# Patient Record
Sex: Female | Born: 1975 | Race: White | Hispanic: No | Marital: Married | Smoking: Former smoker
Health system: Southern US, Community
[De-identification: ages and names within clinical notes are randomized; demographics above are authoritative.]

## PROBLEM LIST (undated history)

## (undated) DIAGNOSIS — E538 Deficiency of other specified B group vitamins: Secondary | ICD-10-CM

## (undated) DIAGNOSIS — F329 Major depressive disorder, single episode, unspecified: Secondary | ICD-10-CM

## (undated) DIAGNOSIS — M545 Low back pain: Secondary | ICD-10-CM

## (undated) DIAGNOSIS — F32A Depression, unspecified: Secondary | ICD-10-CM

## (undated) DIAGNOSIS — F419 Anxiety disorder, unspecified: Secondary | ICD-10-CM

## (undated) DIAGNOSIS — I10 Essential (primary) hypertension: Secondary | ICD-10-CM

## (undated) DIAGNOSIS — T7840XA Allergy, unspecified, initial encounter: Secondary | ICD-10-CM

## (undated) HISTORY — DX: Anxiety disorder, unspecified: F41.9

## (undated) HISTORY — DX: Deficiency of other specified B group vitamins: E53.8

## (undated) HISTORY — DX: Essential (primary) hypertension: I10

## (undated) HISTORY — DX: Low back pain: M54.5

## (undated) HISTORY — DX: Major depressive disorder, single episode, unspecified: F32.9

## (undated) HISTORY — DX: Depression, unspecified: F32.A

## (undated) HISTORY — DX: Allergy, unspecified, initial encounter: T78.40XA

---

## 2000-07-18 ENCOUNTER — Other Ambulatory Visit: Admission: RE | Admit: 2000-07-18 | Discharge: 2000-07-18 | Payer: Self-pay | Admitting: Family Medicine

## 2001-01-19 ENCOUNTER — Other Ambulatory Visit: Admission: RE | Admit: 2001-01-19 | Discharge: 2001-01-19 | Payer: Self-pay | Admitting: Internal Medicine

## 2001-11-06 ENCOUNTER — Emergency Department (HOSPITAL_COMMUNITY): Admission: EM | Admit: 2001-11-06 | Discharge: 2001-11-06 | Payer: Self-pay

## 2001-11-25 ENCOUNTER — Encounter: Payer: Self-pay | Admitting: Family Medicine

## 2001-11-25 ENCOUNTER — Encounter: Admission: RE | Admit: 2001-11-25 | Discharge: 2001-11-25 | Payer: Self-pay | Admitting: Family Medicine

## 2002-05-01 ENCOUNTER — Emergency Department (HOSPITAL_COMMUNITY): Admission: EM | Admit: 2002-05-01 | Discharge: 2002-05-01 | Payer: Self-pay | Admitting: Emergency Medicine

## 2003-08-26 ENCOUNTER — Emergency Department (HOSPITAL_COMMUNITY): Admission: EM | Admit: 2003-08-26 | Discharge: 2003-08-26 | Payer: Self-pay | Admitting: Family Medicine

## 2003-09-09 ENCOUNTER — Emergency Department (HOSPITAL_COMMUNITY): Admission: EM | Admit: 2003-09-09 | Discharge: 2003-09-09 | Payer: Self-pay | Admitting: Family Medicine

## 2004-02-17 ENCOUNTER — Other Ambulatory Visit: Admission: RE | Admit: 2004-02-17 | Discharge: 2004-02-17 | Payer: Self-pay | Admitting: Obstetrics and Gynecology

## 2004-08-20 ENCOUNTER — Inpatient Hospital Stay (HOSPITAL_COMMUNITY): Admission: AD | Admit: 2004-08-20 | Discharge: 2004-08-20 | Payer: Self-pay | Admitting: Obstetrics & Gynecology

## 2004-08-24 ENCOUNTER — Inpatient Hospital Stay (HOSPITAL_COMMUNITY): Admission: AD | Admit: 2004-08-24 | Discharge: 2004-08-28 | Payer: Self-pay | Admitting: Obstetrics and Gynecology

## 2004-09-30 ENCOUNTER — Other Ambulatory Visit: Admission: RE | Admit: 2004-09-30 | Discharge: 2004-09-30 | Payer: Self-pay | Admitting: Obstetrics and Gynecology

## 2005-12-07 ENCOUNTER — Ambulatory Visit: Payer: Self-pay | Admitting: Internal Medicine

## 2006-07-14 ENCOUNTER — Ambulatory Visit: Payer: Self-pay | Admitting: Internal Medicine

## 2006-08-01 ENCOUNTER — Inpatient Hospital Stay (HOSPITAL_COMMUNITY): Admission: AD | Admit: 2006-08-01 | Discharge: 2006-08-01 | Payer: Self-pay | Admitting: Obstetrics and Gynecology

## 2006-08-05 ENCOUNTER — Encounter (INDEPENDENT_AMBULATORY_CARE_PROVIDER_SITE_OTHER): Payer: Self-pay | Admitting: Specialist

## 2006-08-05 ENCOUNTER — Inpatient Hospital Stay (HOSPITAL_COMMUNITY): Admission: AD | Admit: 2006-08-05 | Discharge: 2006-08-08 | Payer: Self-pay | Admitting: Obstetrics and Gynecology

## 2007-06-21 ENCOUNTER — Ambulatory Visit: Payer: Self-pay | Admitting: Internal Medicine

## 2007-06-21 DIAGNOSIS — N390 Urinary tract infection, site not specified: Secondary | ICD-10-CM | POA: Insufficient documentation

## 2007-06-21 LAB — CONVERTED CEMR LAB
Beta hcg, urine, semiquantitative: NEGATIVE
Bilirubin Urine: NEGATIVE
Glucose, Urine, Semiquant: NEGATIVE
Ketones, urine, test strip: NEGATIVE
Nitrite: NEGATIVE
Protein, U semiquant: NEGATIVE
RBC / HPF: NONE SEEN (ref <3)
Specific Gravity, Urine: >=1.03
Urobilinogen, UA: NEGATIVE
pH: 5

## 2008-04-30 ENCOUNTER — Telehealth (INDEPENDENT_AMBULATORY_CARE_PROVIDER_SITE_OTHER): Payer: Self-pay | Admitting: *Deleted

## 2009-01-19 ENCOUNTER — Ambulatory Visit: Payer: Self-pay | Admitting: Diagnostic Radiology

## 2009-01-19 ENCOUNTER — Emergency Department (HOSPITAL_BASED_OUTPATIENT_CLINIC_OR_DEPARTMENT_OTHER): Admission: EM | Admit: 2009-01-19 | Discharge: 2009-01-19 | Payer: Self-pay | Admitting: Emergency Medicine

## 2009-07-03 DIAGNOSIS — R42 Dizziness and giddiness: Secondary | ICD-10-CM | POA: Insufficient documentation

## 2009-07-26 DIAGNOSIS — M545 Low back pain, unspecified: Secondary | ICD-10-CM

## 2009-07-26 HISTORY — DX: Low back pain, unspecified: M54.50

## 2010-07-26 DIAGNOSIS — E538 Deficiency of other specified B group vitamins: Secondary | ICD-10-CM

## 2010-07-26 HISTORY — DX: Deficiency of other specified B group vitamins: E53.8

## 2010-09-01 DIAGNOSIS — M546 Pain in thoracic spine: Secondary | ICD-10-CM | POA: Insufficient documentation

## 2010-11-02 LAB — URINALYSIS, ROUTINE W REFLEX MICROSCOPIC
Nitrite: NEGATIVE
Protein, ur: NEGATIVE mg/dL
Urobilinogen, UA: 1 mg/dL (ref 0.0–1.0)

## 2010-11-02 LAB — POCT CARDIAC MARKERS
CKMB, poc: <1 ng/mL — ABNORMAL LOW (ref 1.0–8.0)
Myoglobin, poc: 25.6 ng/mL (ref 12–200)
Troponin i, poc: <0.05 ng/mL (ref 0.00–0.09)

## 2010-11-11 ENCOUNTER — Telehealth: Payer: Self-pay | Admitting: *Deleted

## 2010-11-11 NOTE — Telephone Encounter (Signed)
Patient requesting a call back. She will be a new patient in May and has questions.

## 2010-12-07 ENCOUNTER — Encounter: Payer: Self-pay | Admitting: Internal Medicine

## 2010-12-08 ENCOUNTER — Other Ambulatory Visit (INDEPENDENT_AMBULATORY_CARE_PROVIDER_SITE_OTHER): Payer: BC Managed Care – PPO

## 2010-12-08 ENCOUNTER — Encounter: Payer: Self-pay | Admitting: Internal Medicine

## 2010-12-08 ENCOUNTER — Telehealth: Payer: Self-pay | Admitting: Internal Medicine

## 2010-12-08 ENCOUNTER — Ambulatory Visit (INDEPENDENT_AMBULATORY_CARE_PROVIDER_SITE_OTHER): Payer: BC Managed Care – PPO | Admitting: Internal Medicine

## 2010-12-08 ENCOUNTER — Other Ambulatory Visit (INDEPENDENT_AMBULATORY_CARE_PROVIDER_SITE_OTHER): Payer: BC Managed Care – PPO | Admitting: Internal Medicine

## 2010-12-08 DIAGNOSIS — F32A Depression, unspecified: Secondary | ICD-10-CM

## 2010-12-08 DIAGNOSIS — R202 Paresthesia of skin: Secondary | ICD-10-CM

## 2010-12-08 DIAGNOSIS — M545 Low back pain, unspecified: Secondary | ICD-10-CM

## 2010-12-08 DIAGNOSIS — R209 Unspecified disturbances of skin sensation: Secondary | ICD-10-CM

## 2010-12-08 DIAGNOSIS — Z0000 Encounter for general adult medical examination without abnormal findings: Secondary | ICD-10-CM

## 2010-12-08 DIAGNOSIS — E559 Vitamin D deficiency, unspecified: Secondary | ICD-10-CM

## 2010-12-08 DIAGNOSIS — R5383 Other fatigue: Secondary | ICD-10-CM

## 2010-12-08 DIAGNOSIS — R5381 Other malaise: Secondary | ICD-10-CM

## 2010-12-08 DIAGNOSIS — E538 Deficiency of other specified B group vitamins: Secondary | ICD-10-CM

## 2010-12-08 DIAGNOSIS — R635 Abnormal weight gain: Secondary | ICD-10-CM

## 2010-12-08 DIAGNOSIS — F329 Major depressive disorder, single episode, unspecified: Secondary | ICD-10-CM

## 2010-12-08 LAB — COMPREHENSIVE METABOLIC PANEL
ALT: 16 U/L (ref 0–35)
AST: 16 U/L (ref 0–37)
Albumin: 4.3 g/dL (ref 3.5–5.2)
Alkaline Phosphatase: 40 U/L (ref 39–117)
Calcium: 9.4 mg/dL (ref 8.4–10.5)
Chloride: 103 mEq/L (ref 96–112)
Potassium: 4.2 mEq/L (ref 3.5–5.1)

## 2010-12-08 LAB — CBC WITH DIFFERENTIAL/PLATELET
Basophils Absolute: 0 10*3/uL (ref 0.0–0.1)
Basophils Relative: 0.2 % (ref 0.0–3.0)
Eosinophils Absolute: 0.1 10*3/uL (ref 0.0–0.7)
Hemoglobin: 13.8 g/dL (ref 12.0–15.0)
Lymphocytes Relative: 11.2 % — ABNORMAL LOW (ref 12.0–46.0)
MCHC: 34.3 g/dL (ref 30.0–36.0)
MCV: 94.4 fl (ref 78.0–100.0)
Monocytes Absolute: 0.5 10*3/uL (ref 0.1–1.0)
Neutro Abs: 8.8 10*3/uL — ABNORMAL HIGH (ref 1.4–7.7)
Neutrophils Relative %: 83 % — ABNORMAL HIGH (ref 43.0–77.0)
RDW: 13.3 % (ref 11.5–14.6)

## 2010-12-08 LAB — URINALYSIS, ROUTINE W REFLEX MICROSCOPIC
Nitrite: NEGATIVE
Total Protein, Urine: NEGATIVE
Urine Glucose: NEGATIVE
pH: 5.5 (ref 5.0–8.0)

## 2010-12-08 LAB — TSH: TSH: 2.3 u[IU]/mL (ref 0.35–5.50)

## 2010-12-08 MED ORDER — MELOXICAM 15 MG PO TABS: 15.0000 mg | ORAL_TABLET | Freq: Every day | ORAL | Status: DC | PRN

## 2010-12-08 MED ORDER — HYDROCODONE-ACETAMINOPHEN 7.5-325 MG PO TABS: 1.0000 | ORAL_TABLET | Freq: Three times a day (TID) | ORAL | Status: DC | PRN

## 2010-12-08 MED ORDER — VITAMIN B-12 1000 MCG SL SUBL: 1.0000 | SUBLINGUAL_TABLET | Freq: Every day | SUBLINGUAL | Status: DC

## 2010-12-08 MED ORDER — VITAMIN D 1000 UNITS PO TABS: 1000.0000 [IU] | ORAL_TABLET | Freq: Every day | ORAL | Status: DC

## 2010-12-08 NOTE — Progress Notes (Signed)
  Subjective:    Patient ID: [name], female    DOB: 04/24/1976, 35 y.o.   MRN: [id number]  HPI  New pt C/o LBP since Aug 2012. C/o bad pack pain 8/10, better w/rest. Predn did not work, Tramadol did not work, T#3 did not work. She had another LBP episode in 2003, that resolved.  She had an MRI at [facility] in Feb - saw Dr Rubie Maid in March She had an inj x 1 Dr Ezzard Standing S1-L1  Review of Systems  Constitutional: Positive for activity change and fatigue. Negative for unexpected weight change.  HENT: Negative for neck pain.   Eyes: Negative for pain.  Respiratory: Negative for chest tightness and shortness of breath.   Cardiovascular: Negative for chest pain and leg swelling.  Gastrointestinal: Negative for constipation and abdominal distention.  Genitourinary: Negative for difficulty urinating and pelvic pain.  Musculoskeletal: Positive for back pain, arthralgias and gait problem. Negative for myalgias and joint swelling.  Skin: Negative for rash and wound.  Neurological: Positive for numbness. Negative for tremors and weakness.  Hematological: Negative for adenopathy.  Psychiatric/Behavioral: Negative for suicidal ideas, behavioral problems and sleep disturbance. The patient is nervous/anxious.        Objective:   Physical Exam  Vitals reviewed. Constitutional: She is oriented to person, place, and time. She appears well-developed and well-nourished. No distress.       NAD Overweight some  HENT:  Head: Normocephalic.  Right Ear: External ear normal.  Left Ear: External ear normal.  Nose: Nose normal.  Mouth/Throat: Oropharynx is clear and moist.  Eyes: Conjunctivae are normal. Pupils are equal, round, and reactive to light. Right eye exhibits no discharge. Left eye exhibits no discharge.  Neck: Normal range of motion. Neck supple. No JVD present. No tracheal deviation present. No thyromegaly present.  Cardiovascular: Normal rate, regular rhythm and normal heart sounds.     Pulmonary/Chest: No stridor. No respiratory distress. She has no wheezes.  Abdominal: Soft. Bowel sounds are normal. She exhibits no distension and no mass. There is no tenderness. There is no rebound and no guarding.  Musculoskeletal: She exhibits tenderness. She exhibits no edema.       LS is very stiff - pain w/ROM  Lymphadenopathy:    She has no cervical adenopathy.  Neurological: She is oriented to person, place, and time. She displays normal reflexes. No cranial nerve deficit. She exhibits normal muscle tone. Coordination normal.  Skin: No rash noted. No erythema.  Psychiatric: Her behavior is normal. Judgment and thought content normal.       A little tearfull w/frustration       Nonfocal neuro exam   Assessment & Plan:  Vitamin D deficiency Start Rx  Vitamin B 12 deficiency Start Rx  Paresthesia Get labs  Fatigue Get labs  Depression On Rx  LBP (low back pain) I reviewed MRI, Hx - discussed. She needs ROM/stretching. She needs B12 and Vit D def treated.

## 2010-12-08 NOTE — Patient Instructions (Signed)
Stretch Start a yoga class We can start PT

## 2010-12-08 NOTE — Telephone Encounter (Signed)
[name], please, inform patient that all labs are normal except for low Vit B12 OV to start on Vit B12 shots and start SL B12

## 2010-12-09 ENCOUNTER — Telehealth: Payer: Self-pay | Admitting: *Deleted

## 2010-12-09 NOTE — Telephone Encounter (Signed)
Left mess for patient to call back.  

## 2010-12-09 NOTE — Telephone Encounter (Signed)
Left mess to call office back.   

## 2010-12-09 NOTE — Telephone Encounter (Signed)
Patient requesting a call back - has questions about a med that MD e-scribed yesterday.

## 2010-12-10 ENCOUNTER — Telehealth: Payer: Self-pay | Admitting: Internal Medicine

## 2010-12-10 MED ORDER — VITAMIN D3 1.25 MG (50000 UT) PO CAPS: 1.0000 | ORAL_CAPSULE | ORAL | Status: DC

## 2010-12-10 NOTE — Telephone Encounter (Signed)
[name], please, inform patient that her Vit D is low - Start Rx Vit D x 6 wks then start OTC daily Vit D Thx

## 2010-12-10 NOTE — Telephone Encounter (Signed)
Discussed, see other phone notes

## 2010-12-10 NOTE — Telephone Encounter (Signed)
Patient informed. 

## 2010-12-11 ENCOUNTER — Encounter: Payer: Self-pay | Admitting: Internal Medicine

## 2010-12-11 NOTE — Op Note (Signed)
[name], [name]              ACCOUNT NO.:  [id number]   MEDICAL RECORD NO.:  [id number]          PATIENT TYPE:  INP   LOCATION:  9147                          FACILITY:  [facility]   PHYSICIAN:  Zelphia Cairo, MD    DATE OF BIRTH:  1976/06/15   DATE OF PROCEDURE:  08/05/2006  DATE OF DISCHARGE:                               OPERATIVE REPORT   PREOPERATIVE DIAGNOSES:  1. Intrauterine pregnancy at 38 plus weeks.  2. Nonreassuring fetal well being.   POSTOPERATIVE DIAGNOSES:  1. Intrauterine pregnancy at 38 plus weeks.  2. Nonreassuring fetal well being.   PROCEDURE:  Primary low transverse cesarean delivery.   SURGEON:  Zelphia Cairo, MD   ESTIMATED BLOOD LOSS:  800 cc.   URINE OUTPUT:  200 cc of clear urine.   FINDINGS:  Vigorous female infant with Apgars of 9 and 9.  pH 7.32.  Normal pelvic anatomy.   ANESTHESIA:  General.   SPECIMENS:  Placenta to pathology.   CONDITION:  Stable to recovery room.   PROCEDURE:  The patient was taken to the operating room, where epidural  anesthesia was found to be adequate.  She was prepped and draped in  sterile fashion.  A skin incision was made with the scalpel and carried  down to the underlying fascia.  The fascia was incised in the midline  and extended laterally using Mayo scissors.  Kocher clamps were used to  tent the fascia upward.  The underlying rectus muscles were dissected  off using Mayo scissors.   Attention was then turned to the superior portion of the fascia, which  was tented up with Kocher clamps and the underlying rectus muscles  dissected off using the Mayo scissors.  The peritoneum was then  identified and entered sharply using Metzenbaum scissors;  this was  extended bluntly.  A bladder blade was then inserted and bladder flap  created with Metzenbaum scissors.  The bladder blade reinserted to  protect the bladder flap.   The scalpel was then used to make the uterine incision.  This was  extended  bluntly.  The fetal vertex was brought up to the incision and  the fetal head was delivered.  The mouth and nose were suctioned and the  body easily followed.  The cord was clamped and cut and the infant was  taken to the awaiting pediatric staff.  The cord blood was then drawn  and the placenta was manually removed from the uterus.  The uterus was  then closed using 1-0 chromic in a running, locked fashion.  Hemostasis  was assured and the pelvis was irrigated with warm normal saline.  The  peritoneum was then closed with Vicryl.  The fascia was closed with a  looped 0 PDS, and the skin was reapproximated with staples.  The patient  tolerated the procedure well.  Sponge, lap, needle and instrument counts  were correct x2.  She was taken to the recovery room in stable  condition.      Zelphia Cairo, MD  Electronically Signed     GA/MEDQ  D:  08/05/2006  T:  08/05/2006  Job:  [id number]

## 2010-12-11 NOTE — H&P (Signed)
NAMEBRITTANYANN, [name]              ACCOUNT NO.:  [id number]   MEDICAL RECORD NO.:  [id number]          PATIENT TYPE:  INP   LOCATION:  9173                          FACILITY:  [facility]   PHYSICIAN:  Michelle L. Grewal, M.D.DATE OF BIRTH:  1975/11/27   DATE OF ADMISSION:  08/24/2004  DATE OF DISCHARGE:                                HISTORY & PHYSICAL   HISTORY OF PRESENT ILLNESS:  This patient is a 35 year old, gravida 1, para  0, estimated gestational age at 78 weeks and 6 days who presents today from  the office.  She presented to the office today complaining of nausea and  vomiting for two days.  She denies any fever.  She reports that her husband  has been sick with viral illness which included nausea, vomiting, and  diarrhea.  In the office, her urine revealed +3 ketones and she was noted to  be contracting on the monitor every two to four minutes.   Of note, prenatal care see [place] form.  She is group B strep positive.   PHYSICAL EXAMINATION:  GENERAL:  She is afebrile with stable vital signs.  Fetal heart rate initially was nonreactive in Triage but now reassuring.  No  decelerations noted.  PELVIC:  Uterine contractions every two to four minutes, Leopold's 7-1/2  pounds.  Cervix is 30% effaced.  Cervix is closed and vertex presentation.  CARDIOVASCULAR:  Within normal limits.  LUNGS:  Within normal limits.  ABDOMEN:  Benign, nontender, gravid abdomen.   LABORATORY DATA:  Ultrasound in Triage revealed an amniotic fluid index of  6.9 and biophysical profile of 8 out of 8.   IMPRESSION:  1.  Intrauterine pregnancy at 37 weeks and 6 days.  2.  Decreased fetal movement.  3.  Borderline low amniotic fluid index.  4.  Recent viral illness.   PLAN:  The patient responded wonderfully to IV fluids and antiemetics.  However, because of decreased movement and borderline low AFI, recommend  admission to labor and delivery for induction.  We will initiate Cytotec  tonight and plan  Pitocin and amniotomy in the morning.  We will start  antibiotics for group B strep prophylaxis.      MLG/MEDQ  D:  08/24/2004  T:  08/24/2004  Job:  [id number]

## 2010-12-11 NOTE — Discharge Summary (Signed)
[name], [name]              ACCOUNT NO.:  [id number]   MEDICAL RECORD NO.:  [id number]          PATIENT TYPE:  INP   LOCATION:  9128                          FACILITY:  [facility]   PHYSICIAN:  Guy Sandifer. Henderson Cloud, M.D. DATE OF BIRTH:  12-13-1975   DATE OF ADMISSION:  08/05/2006  DATE OF DISCHARGE:  08/08/2006                               DISCHARGE SUMMARY   ADMITTING DIAGNOSES:  1. Intrauterine pregnancy at 75 and three-sevenths weeks estimated      gestational age.  2. Induction of labor secondary to oligohydramnios.   DISCHARGE DIAGNOSES:  1. Status post low transverse cesarean section secondary to      nonreassuring fetal heart tones.  2. Viable female infant.   PROCEDURE:  Primary low transverse cesarean section.   REASON FOR ADMISSION:  Please see written H&P.   HOSPITAL COURSE:  The patient is a 35 year old gravida 3, para 1 that  was admitted to [facility] at 38 and three-sevenths  weeks for an induction of labor secondary to oligohydramnios.  The  patient denied loss of amniotic fluid or vaginal bleeding.  On admission  she was noted to have some irregular contractions.  Fetal movement was  noted.  Cervix was dilated to 2 cm, 75% effaced, vertex at a -2 station.  Artificial rupture of membranes was performed which revealed clear  fluid.  Pitocin was started later in the afternoon to augment her labor.  Fetal heart tones were in the 130s to 140s with reassuring  accelerations.  Contractions were approximately every 2-4 minutes.  Later in the afternoon the fetal heart tones were noted to have some  change in the baseline.  Maternal blood pressure was noted to be low and  there were noted to be some moderate variable decelerations.  Cervix was  now dilated to 7 cm, 90% effaced, vertex now at a 0 station.  Scalp  stimulation was performed, intrauterine pressure catheter was placed.  Variability was marked with some questionable late deceleration.  Maternal  blood pressure was 90/30.  Ephedrine was given and positional  change was performed and oxygen administration with amnioinfusion.  Fetal heart tones were now in the 130-140 days.  Blood pressure improved  to 98/48.  Shortly thereafter, fetal heart tones again were noted to be  in the 90s to 110s.  There was some good beat-to-beat variability, but  concern was regarding unpredictable fetal heart tone pattern.  Decision  was made to proceed with a primary low transverse cesarean section due  to nonreassuring fetal heart tones.  Cervix was dilated to 8 cm,  completely effaced, at a 0 station.  The patient was then transferred to  the operating room where spinal anesthesia was administered without  difficulty.  A low transverse incision was made with the delivery of a  viable female infant weighing 6 pounds 4 ounces with Apgars of 9 at one  minute and 9 at five minutes.  Arterial cord pH was 7.32.  The patient  tolerated the procedure well and was taken to the recovery room in  stable condition.  On postoperative day #1  the patient was without  complaint.  Vital signs were stable.  She was afebrile.  Abdomen was  soft, fundus firm and nontender.  Incision was noted to be clean, dry  and intact.  Laboratory findings revealed hemoglobin of 10.1; platelet  count 174,000; wbc count of 14.9.  On postoperative day #2 the patient  was without complaint.  Vital signs were stable.  Abdomen was soft.  Incision was clean, dry and intact.  The patient had requested early  discharge; however, the baby was not feeding well and decision was made  to stay an additional evening.  On the following morning the patient  without complaint.  Baby was now feeding well.  Vital signs were stable.  Abdomen soft, fundus firm and nontender.  Staples were removed and the  patient was later discharged home.   CONDITION ON DISCHARGE:  Good.   DIET:  Regular as tolerated.   ACTIVITY:  No heavy lifting, no driving x2  weeks, no vaginal entry.   FOLLOWUP:  The patient is to follow up in the office in 1-2 weeks for an  incision check.  She is to call for temperature greater than 100  degrees, persistent nausea/vomiting, heavy vaginal bleeding, and/or  redness or drainage from the incisional site.   DISCHARGE MEDICATIONS:  1. Percocet 5/325 #30 one p.o. every 4-6 hours p.r.n.  2. Motrin 600 mg every 6 hours.  3. Prenatal vitamins one p.o. daily.  4. Colace one p.o. daily p.r.n.      Julio Sicks, N.P.      Guy Sandifer. Henderson Cloud, M.D.  Electronically Signed    CC/MEDQ  D:  08/22/2006  T:  08/22/2006  Job:  [id number]

## 2010-12-12 ENCOUNTER — Encounter: Payer: Self-pay | Admitting: Internal Medicine

## 2010-12-12 DIAGNOSIS — R5383 Other fatigue: Secondary | ICD-10-CM | POA: Insufficient documentation

## 2010-12-12 DIAGNOSIS — R202 Paresthesia of skin: Secondary | ICD-10-CM | POA: Insufficient documentation

## 2010-12-12 DIAGNOSIS — M545 Low back pain, unspecified: Secondary | ICD-10-CM | POA: Insufficient documentation

## 2010-12-12 DIAGNOSIS — E559 Vitamin D deficiency, unspecified: Secondary | ICD-10-CM | POA: Insufficient documentation

## 2010-12-12 DIAGNOSIS — F32A Depression, unspecified: Secondary | ICD-10-CM | POA: Insufficient documentation

## 2010-12-12 DIAGNOSIS — F329 Major depressive disorder, single episode, unspecified: Secondary | ICD-10-CM | POA: Insufficient documentation

## 2010-12-12 DIAGNOSIS — E538 Deficiency of other specified B group vitamins: Secondary | ICD-10-CM | POA: Insufficient documentation

## 2010-12-12 NOTE — Assessment & Plan Note (Signed)
Start Rx 

## 2010-12-12 NOTE — Assessment & Plan Note (Signed)
On Rx 

## 2010-12-12 NOTE — Assessment & Plan Note (Signed)
Get labs

## 2010-12-12 NOTE — Assessment & Plan Note (Signed)
I reviewed MRI, Hx - discussed. She needs ROM/stretching. She needs B12 and Vit D def treated.

## 2010-12-15 ENCOUNTER — Ambulatory Visit (INDEPENDENT_AMBULATORY_CARE_PROVIDER_SITE_OTHER): Payer: BC Managed Care – PPO | Admitting: Internal Medicine

## 2010-12-15 ENCOUNTER — Encounter: Payer: Self-pay | Admitting: Internal Medicine

## 2010-12-15 DIAGNOSIS — E538 Deficiency of other specified B group vitamins: Secondary | ICD-10-CM

## 2010-12-15 DIAGNOSIS — F32A Depression, unspecified: Secondary | ICD-10-CM

## 2010-12-15 DIAGNOSIS — E559 Vitamin D deficiency, unspecified: Secondary | ICD-10-CM

## 2010-12-15 DIAGNOSIS — M545 Low back pain, unspecified: Secondary | ICD-10-CM

## 2010-12-15 DIAGNOSIS — F329 Major depressive disorder, single episode, unspecified: Secondary | ICD-10-CM

## 2010-12-15 MED ORDER — CYANOCOBALAMIN 1000 MCG/ML IJ SOLN
1000.0000 ug | Freq: Once | INTRAMUSCULAR | Status: AC
  Administered 2010-12-15: 1000 ug via INTRAMUSCULAR

## 2010-12-15 NOTE — Progress Notes (Signed)
  Subjective:    Patient ID: [name], female    DOB: 12-06-1975, 35 y.o.   MRN: [id number]  HPI  F/u LBP, B12 def and Vit D def  Review of Systems  Constitutional: Negative for chills and appetite change.  Musculoskeletal: Positive for back pain. Negative for myalgias and joint swelling.  Skin: Negative for pallor.  Neurological: Negative for syncope.  Psychiatric/Behavioral: The patient is nervous/anxious.        Objective:   Physical Exam  Constitutional: She appears well-developed and well-nourished. No distress.  HENT:  Head: Normocephalic.  Right Ear: External ear normal.  Left Ear: External ear normal.  Nose: Nose normal.  Mouth/Throat: Oropharynx is clear and moist.  Eyes: Conjunctivae are normal. Pupils are equal, round, and reactive to light. Right eye exhibits no discharge. Left eye exhibits no discharge.  Neck: Normal range of motion. Neck supple. No JVD present. No tracheal deviation present. No thyromegaly present.  Cardiovascular: Normal rate, regular rhythm and normal heart sounds.   Pulmonary/Chest: No stridor. No respiratory distress. She has no wheezes.  Abdominal: Soft. Bowel sounds are normal. She exhibits no distension and no mass. There is no tenderness. There is no rebound and no guarding.  Musculoskeletal: She exhibits tenderness (tender LS). She exhibits no edema.  Lymphadenopathy:    She has no cervical adenopathy.  Neurological: She displays normal reflexes. No cranial nerve deficit. She exhibits normal muscle tone. Coordination normal.  Skin: No rash noted. No erythema.  Psychiatric: She has a normal mood and affect. Her behavior is normal. Judgment and thought content normal.       Lab Results  Component Value Date   WBC 10.6* 12/08/2010   HGB 13.8 12/08/2010   HCT 40.2 12/08/2010   PLT 249.0 12/08/2010   ALT 16 12/08/2010   AST 16 12/08/2010   NA 138 12/08/2010   K 4.2 12/08/2010   CL 103 12/08/2010   CREATININE 0.8 12/08/2010   BUN 16  12/08/2010   CO2 27 12/08/2010   TSH 2.30 12/08/2010     Assessment & Plan:   Vitamin B 12 deficiency Start Rx  Vitamin D deficiency Start Rx  LBP (low back pain) Start yoga  Depression On Rx

## 2010-12-15 NOTE — Patient Instructions (Signed)
[email or web address] 

## 2010-12-17 ENCOUNTER — Other Ambulatory Visit: Payer: Self-pay | Admitting: Specialist

## 2010-12-18 ENCOUNTER — Encounter: Payer: Self-pay | Admitting: Internal Medicine

## 2010-12-21 ENCOUNTER — Encounter: Payer: Self-pay | Admitting: Internal Medicine

## 2010-12-21 NOTE — Assessment & Plan Note (Signed)
Start Rx 

## 2010-12-21 NOTE — Assessment & Plan Note (Signed)
On Rx 

## 2010-12-21 NOTE — Assessment & Plan Note (Signed)
Start yoga  

## 2011-01-14 ENCOUNTER — Telehealth: Payer: Self-pay | Admitting: *Deleted

## 2011-01-14 NOTE — Telephone Encounter (Signed)
Pt c/o sore throat, "hurts to swallow", ear pain and cough. I advised OV for eval and offered apt tomorrow. She is going out of town - I suggested she go to [facility] for eval b/c she is worried she may have strep throat. - Pt agreed.

## 2011-01-20 ENCOUNTER — Telehealth: Payer: Self-pay | Admitting: *Deleted

## 2011-01-20 NOTE — Telephone Encounter (Signed)

## 2011-01-20 NOTE — Telephone Encounter (Signed)
Patient requesting advisement. She is c/o cough x 1 week - no pain, fever or sinus drainage.

## 2011-01-21 ENCOUNTER — Ambulatory Visit (INDEPENDENT_AMBULATORY_CARE_PROVIDER_SITE_OTHER): Payer: BC Managed Care – PPO | Admitting: Internal Medicine

## 2011-01-21 ENCOUNTER — Encounter: Payer: Self-pay | Admitting: Internal Medicine

## 2011-01-21 DIAGNOSIS — R062 Wheezing: Secondary | ICD-10-CM | POA: Insufficient documentation

## 2011-01-21 DIAGNOSIS — M545 Low back pain, unspecified: Secondary | ICD-10-CM

## 2011-01-21 DIAGNOSIS — Z0000 Encounter for general adult medical examination without abnormal findings: Secondary | ICD-10-CM | POA: Insufficient documentation

## 2011-01-21 DIAGNOSIS — J209 Acute bronchitis, unspecified: Secondary | ICD-10-CM | POA: Insufficient documentation

## 2011-01-21 MED ORDER — HYDROCODONE-HOMATROPINE 5-1.5 MG/5ML PO SYRP: 5.0000 mL | ORAL_SOLUTION | Freq: Four times a day (QID) | ORAL | Status: DC | PRN

## 2011-01-21 MED ORDER — HYDROCODONE-ACETAMINOPHEN 7.5-500 MG PO TABS: ORAL_TABLET | ORAL | Status: DC

## 2011-01-21 MED ORDER — AZITHROMYCIN 250 MG PO TABS: ORAL_TABLET | ORAL | Status: AC

## 2011-01-21 MED ORDER — PREDNISONE 10 MG PO TABS: 20.0000 mg | ORAL_TABLET | Freq: Every day | ORAL | Status: AC

## 2011-01-21 NOTE — Assessment & Plan Note (Signed)
stable overall by hx and exam, most recent data reviewed with pt, and pt to continue medical treatment as before; vicodin refilled one mo only;  F/u Dr Posey Rea next visit planned July 24

## 2011-01-21 NOTE — Progress Notes (Signed)
  Subjective:    Patient ID: [name], female    DOB: [birth year], 35 y.o.   MRN: [id number]  HPI Here with acute onset mild to mod 2-3 days ST, HA, general weakness and malaise, with prod cough greenish sputum, but Pt denies chest pain, orthopnea, PND, increased LE swelling, palpitations, dizziness or syncope, but has had mild sob and wheezing onset this AM.  Pt denies new neurological symptoms such as new headache, or facial or extremity weakness or numbness   Pt denies polydipsia, polyuria.  Pt continues to have recurring LBP without change in severity, bowel or bladder change, fever, wt loss,  worsening LE pain/numbness/weakness, gait change or falls. 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 Past Surgical History  Procedure Date  . Cesarean section 1-08    reports that she has quit smoking. She does not have any smokeless tobacco history on file. Her alcohol and drug histories not on file. family history includes Kidney disease in her other. No Known Allergies Current Outpatient Prescriptions on File Prior to Visit  Medication Sig Dispense Refill  . ALPRAZolam (XANAX) 0.25 MG tablet Take 0.25 mg by mouth as needed.        . cholecalciferol (VITAMIN D) 1000 UNITS tablet Take 1 tablet (1,000 Units total) by mouth daily.  100 tablet  3  . Cholecalciferol (VITAMIN D3) 50000 UNITS CAPS Take 1 capsule by mouth once a week.  6 capsule  0  . Cyanocobalamin (VITAMIN B-12) 1000 MCG SUBL Place 1 tablet (1,000 mcg total) under the tongue daily.  100 tablet  3  . escitalopram (LEXAPRO) 10 MG tablet Take 10 mg by mouth daily.        . meloxicam (MOBIC) 15 MG tablet Take 1 tablet (15 mg total) by mouth daily as needed for pain.  1 tablet  3   Review of Systems Review of Systems  Constitutional: Negative for diaphoresis and unexpected weight change.  HENT: Negative for drooling and tinnitus.   Eyes: Negative for photophobia  and visual disturbance.  Respiratory: Negative for choking and stridor.   Gastrointestinal: Negative for vomiting and blood in stool.  Genitourinary: Negative for hematuria and decreased urine volume.      Objective:   Physical Exam BP 120/80  Pulse 83  Temp(Src) 98.2 F (36.8 C) (Oral)  Ht 5\' 6"  (1.676 m)  Wt 176 lb 2 oz (79.89 kg)  BMI 28.43 kg/m2  SpO2 98% Physical Exam  VS noted, mild ill Constitutional: Pt appears well-developed and well-nourished.  HENT: Head: Normocephalic.  Right Ear: External ear normal.  Left Ear: External ear normal.  Bilat tm's mild erythema.  Sinus nontender.  Pharynx mild erythema Eyes: Conjunctivae and EOM are normal. Pupils are equal, round, and reactive to light.  Neck: Normal range of motion. Neck supple.  Cardiovascular: Normal rate and regular rhythm.   Pulmonary/Chest: Effort normal and breath sounds normal.  Abd:  Soft, NT, non-distended, + BS Neurological: Pt is alert. No cranial nerve deficit. Spine nontender Motor/sens/dtr intact, gait intact Skin: Skin is warm. No erythema.  Psychiatric: Pt behavior is normal. Thought content normal.         Assessment & Plan:

## 2011-01-21 NOTE — Patient Instructions (Signed)
Take all new medications as prescribed Continue all other medications as before  

## 2011-01-21 NOTE — Assessment & Plan Note (Signed)
Mild, for predpak for home,  to f/u any worsening symptoms or concerns

## 2011-01-21 NOTE — Telephone Encounter (Signed)
Left pt detailed vm on personal VM

## 2011-01-21 NOTE — Assessment & Plan Note (Signed)
Mild to mod, for antibx course,  to f/u any worsening symptoms or concerns 

## 2011-02-10 ENCOUNTER — Other Ambulatory Visit (INDEPENDENT_AMBULATORY_CARE_PROVIDER_SITE_OTHER): Payer: BC Managed Care – PPO

## 2011-02-10 DIAGNOSIS — E559 Vitamin D deficiency, unspecified: Secondary | ICD-10-CM

## 2011-02-10 DIAGNOSIS — E538 Deficiency of other specified B group vitamins: Secondary | ICD-10-CM

## 2011-02-10 LAB — URINALYSIS
Bilirubin Urine: NEGATIVE
Ketones, ur: NEGATIVE
Total Protein, Urine: NEGATIVE
Urine Glucose: NEGATIVE
Urobilinogen, UA: 0.2 (ref 0.0–1.0)

## 2011-02-10 LAB — VITAMIN B12: Vitamin B-12: 709 pg/mL (ref 211–911)

## 2011-02-11 ENCOUNTER — Telehealth: Payer: Self-pay | Admitting: Internal Medicine

## 2011-02-11 NOTE — Telephone Encounter (Signed)
Keep ROV 

## 2011-02-16 ENCOUNTER — Encounter: Payer: Self-pay | Admitting: Internal Medicine

## 2011-02-16 ENCOUNTER — Ambulatory Visit (INDEPENDENT_AMBULATORY_CARE_PROVIDER_SITE_OTHER): Payer: BC Managed Care – PPO | Admitting: Internal Medicine

## 2011-02-16 VITALS — BP 122/90 | HR 84 | Temp 98.6°F | Resp 16 | Wt 174.0 lb

## 2011-02-16 DIAGNOSIS — M545 Low back pain, unspecified: Secondary | ICD-10-CM

## 2011-02-16 MED ORDER — HYDROCODONE-ACETAMINOPHEN 7.5-500 MG PO TABS: ORAL_TABLET | ORAL | Status: DC

## 2011-02-16 MED ORDER — HYDROCODONE-ACETAMINOPHEN 7.5-325 MG PO TABS: 1.0000 | ORAL_TABLET | Freq: Three times a day (TID) | ORAL | Status: DC | PRN

## 2011-02-16 NOTE — Assessment & Plan Note (Signed)
Better some Has to take Hydr/APAP prn

## 2011-02-16 NOTE — Progress Notes (Signed)
  Subjective:    Patient ID: [name], female    DOB: July 24, 1976, 35 y.o.   MRN: [id number]  HPI  The patient presents for a follow-up of  chronic LBP, Vit D def and Vit B12 controlled with medicines Pain is 5/10 now C/o nausea - stomach bug today    Review of Systems  Constitutional: Negative for chills, activity change, appetite change, fatigue and unexpected weight change.  HENT: Negative for congestion, mouth sores and sinus pressure.   Eyes: Negative for visual disturbance.  Respiratory: Negative for cough and chest tightness.   Gastrointestinal: Negative for nausea and abdominal pain.  Genitourinary: Negative for frequency, difficulty urinating and vaginal pain.  Musculoskeletal: Positive for back pain. Negative for gait problem.  Skin: Negative for pallor and rash.  Neurological: Negative for dizziness, tremors, weakness, numbness and headaches.  Psychiatric/Behavioral: Negative for confusion and sleep disturbance. The patient is not nervous/anxious.        Objective:   Physical Exam  Constitutional: She appears well-developed and well-nourished. No distress.  HENT:  Head: Normocephalic.  Right Ear: External ear normal.  Left Ear: External ear normal.  Nose: Nose normal.  Mouth/Throat: Oropharynx is clear and moist.  Eyes: Conjunctivae are normal. Pupils are equal, round, and reactive to light. Right eye exhibits no discharge. Left eye exhibits no discharge.  Neck: Normal range of motion. Neck supple. No JVD present. No tracheal deviation present. No thyromegaly present.  Cardiovascular: Normal rate, regular rhythm and normal heart sounds.   Pulmonary/Chest: No stridor. No respiratory distress. She has no wheezes.  Abdominal: Soft. Bowel sounds are normal. She exhibits no distension and no mass. There is tenderness (epigastric). There is no rebound and no guarding.  Musculoskeletal: She exhibits tenderness (lower LS L>R). She exhibits no edema.  Lymphadenopathy:   She has no cervical adenopathy.  Neurological: She displays normal reflexes. No cranial nerve deficit. She exhibits normal muscle tone. Coordination normal.  Skin: No rash noted. No erythema.  Psychiatric: She has a normal mood and affect. Her behavior is normal. Judgment and thought content normal.          Assessment & Plan:

## 2011-02-17 ENCOUNTER — Encounter: Payer: Self-pay | Admitting: Internal Medicine

## 2011-03-17 ENCOUNTER — Telehealth: Payer: Self-pay | Admitting: *Deleted

## 2011-03-17 NOTE — Telephone Encounter (Signed)
Patient requesting a call back regarding foot pain.

## 2011-03-17 NOTE — Telephone Encounter (Signed)
Pt states that she is having severe pain when she walks on her left heel-she states that she is taking Meloxicam and it doesn't help with the pain-pt is asking MD's advisement regarding foot pain

## 2011-03-17 NOTE — Telephone Encounter (Signed)
It is probably, a plantar fasciitis. Use ice, shoes need to have good arch supports. We can do a steroid inj if she would need faster relief Thx

## 2011-03-18 NOTE — Telephone Encounter (Signed)
Patient informed. 

## 2011-03-18 NOTE — Telephone Encounter (Signed)
Left message for pt to callback office.  

## 2011-04-21 ENCOUNTER — Encounter: Payer: Self-pay | Admitting: Internal Medicine

## 2011-04-21 ENCOUNTER — Ambulatory Visit (INDEPENDENT_AMBULATORY_CARE_PROVIDER_SITE_OTHER): Payer: BC Managed Care – PPO | Admitting: Internal Medicine

## 2011-04-21 VITALS — BP 122/82 | HR 75 | Temp 98.8°F | Ht 65.0 in

## 2011-04-21 DIAGNOSIS — J209 Acute bronchitis, unspecified: Secondary | ICD-10-CM

## 2011-04-21 MED ORDER — AZITHROMYCIN 250 MG PO TABS: ORAL_TABLET | ORAL | Status: AC

## 2011-04-21 MED ORDER — PREDNISONE (PAK) 10 MG PO TABS: 10.0000 mg | ORAL_TABLET | ORAL | Status: AC

## 2011-04-21 MED ORDER — ALBUTEROL SULFATE HFA 108 (90 BASE) MCG/ACT IN AERS: 2.0000 | INHALATION_SPRAY | Freq: Four times a day (QID) | RESPIRATORY_TRACT | Status: DC | PRN

## 2011-04-21 MED ORDER — HYDROCODONE-HOMATROPINE 5-1.5 MG/5ML PO SYRP
5.0000 mL | ORAL_SOLUTION | Freq: Four times a day (QID) | ORAL | Status: AC | PRN
Start: 2011-04-21 — End: 2011-05-01

## 2011-04-21 NOTE — Progress Notes (Signed)
  Subjective:    Patient ID: [name], female    DOB: [birth year], 35 y.o.   MRN: [id number]  HPI complains of cough Onset 5 days ago Started in head and associated with sore throat - progressed quickly into chest Feels tight and shortness of breath with min exertion -  occasional wheeze symptoms and dry cough -  symptoms present day and night, not aggravated by food or position but worse with activity No chest pain, no fever, +sick contacts Prior smoker - but none in past 1 month  Past Medical History  Diagnosis Date  . LBP (low back pain) 2011  . Vitamin B12 deficiency 2012  . Vitamin D deficiency 2012  . Depression      Review of Systems  Constitutional: Negative for fever.  HENT: Negative for ear pain.   Respiratory: Positive for chest tightness. Negative for choking and stridor.   Cardiovascular: Negative for chest pain and leg swelling.  Neurological: Positive for numbness. Negative for headaches.       Objective:   Physical Exam BP 122/82  Pulse 75  Temp(Src) 98.8 F (37.1 C) (Oral)  Ht 5\' 5"  (1.651 m)  SpO2 98% Constitutional: She is well-developed and well-nourished. No distress. nontoxic HENT: Head: Normocephalic and atraumatic. Ears: B TMs ok, no erythema or effusion; Nose: Nose normal.  Mouth/Throat: Oropharynx is red but clear and moist. No oropharyngeal exudate.  Eyes: Conjunctivae and EOM are normal. Pupils are equal, round, and reactive to light. No scleral icterus.  Neck: Normal range of motion. Neck supple. Mild anterior LAD, no JVD present. No thyromegaly present.  Cardiovascular: Normal rate, regular rhythm and normal heart sounds.  No murmur heard. No BLE edema. Pulmonary/Chest: Effort normal and breath sounds diminished at bases, few rhonchi. No respiratory distress. She has no wheezes.  Psychiatric: She has a normal mood and affect. Her behavior is normal. Judgment and thought content normal.        Assessment & Plan:  Acute bronchitis  with component of bronchospasm - tx Zpak, pred pack and prn Alb MDI - also cough syrup - rx done Advised on need NOT to resume smoking once feeling better Bronchitis, acute - hx same 12/2010 - no hx asthma but mild exp wheeze -

## 2011-04-21 NOTE — Patient Instructions (Signed)
It was good to see you today. Zpak antibiotics, pred pak and cough syrup for bronchitis symptoms - also use Albuterol inhaler as needed for cough/wheeze Your prescription(s) have been submitted to your pharmacy. Please take as directed and contact our office if you believe you are having problem(s) with the medication(s). Don't start smoking again! This is very bad for you!

## 2011-04-26 ENCOUNTER — Other Ambulatory Visit: Payer: Self-pay | Admitting: Internal Medicine

## 2011-04-28 ENCOUNTER — Telehealth: Payer: Self-pay | Admitting: *Deleted

## 2011-04-28 MED ORDER — FLUCONAZOLE 150 MG PO TABS: 150.0000 mg | ORAL_TABLET | Freq: Once | ORAL | Status: AC

## 2011-04-28 NOTE — Telephone Encounter (Signed)
Patient requesting RX for med for yeast infection. She was recently on antibiotics.

## 2011-04-28 NOTE — Telephone Encounter (Signed)
Ok Diflucan Thx 

## 2011-04-28 NOTE — Telephone Encounter (Signed)
Left detailed vm for pt to check w/pharm

## 2011-05-14 ENCOUNTER — Other Ambulatory Visit: Payer: Self-pay | Admitting: Internal Medicine

## 2011-05-14 NOTE — Telephone Encounter (Signed)
Please advise regarding RF request.

## 2011-05-15 NOTE — Telephone Encounter (Signed)
Ok x 1

## 2011-05-17 ENCOUNTER — Telehealth: Payer: Self-pay | Admitting: *Deleted

## 2011-05-17 NOTE — Telephone Encounter (Signed)
Requested Medications     NORCO 7.5-325 MG per tablet [Pharmacy Med Name: NORCO 7.5-325 TABLET]   TAKE 1 TABLET EVERY 8 HOURS AS NEEDED FOR PAIN.   Disp: 90 each Start: 05/14/2011

## 2011-05-17 NOTE — Telephone Encounter (Signed)
OK to fill this prescription with additional refills x2 Thank you!  

## 2011-05-18 MED ORDER — HYDROCODONE-ACETAMINOPHEN 7.5-325 MG PO TABS: 1.0000 | ORAL_TABLET | Freq: Three times a day (TID) | ORAL | Status: DC | PRN

## 2011-05-19 ENCOUNTER — Telehealth: Payer: Self-pay | Admitting: *Deleted

## 2011-05-19 NOTE — Telephone Encounter (Signed)
Pt left VM c/o shooting pains from back down her leg into her foot. She wanted to know if she needed OV. Attempted to call patient, no answer. Left detailed VM on cell - if symptoms were new or worsening and unrelieved by current treatments, please call office and schedule apt with MD for eval.

## 2011-06-14 ENCOUNTER — Telehealth: Payer: Self-pay | Admitting: *Deleted

## 2011-06-14 NOTE — Telephone Encounter (Signed)
Pt is calling to get early refill on Norco, pt states she is having increasing back pain and has had to take 1 to 2 every 8 hours instead of 1 every 8 hours

## 2011-06-14 NOTE — Telephone Encounter (Signed)
OK early ref. Pls do not use more than Rx Thx

## 2011-06-15 MED ORDER — HYDROCODONE-ACETAMINOPHEN 7.5-325 MG PO TABS: 1.0000 | ORAL_TABLET | Freq: Three times a day (TID) | ORAL | Status: DC | PRN

## 2011-06-15 NOTE — Telephone Encounter (Signed)
Spoke to Angie at pharmacy and authorized ok to fill early. I spoke to pt and advised this. She is aware that she needs to take Norco as prescibed.

## 2011-07-01 ENCOUNTER — Encounter: Payer: Self-pay | Admitting: Internal Medicine

## 2011-07-01 ENCOUNTER — Ambulatory Visit: Payer: BC Managed Care – PPO

## 2011-07-01 ENCOUNTER — Other Ambulatory Visit (INDEPENDENT_AMBULATORY_CARE_PROVIDER_SITE_OTHER): Payer: BC Managed Care – PPO

## 2011-07-01 ENCOUNTER — Ambulatory Visit (INDEPENDENT_AMBULATORY_CARE_PROVIDER_SITE_OTHER): Payer: BC Managed Care – PPO | Admitting: Internal Medicine

## 2011-07-01 ENCOUNTER — Ambulatory Visit (INDEPENDENT_AMBULATORY_CARE_PROVIDER_SITE_OTHER)
Admission: RE | Admit: 2011-07-01 | Discharge: 2011-07-01 | Disposition: A | Discharge status: Home / Self Care | Payer: BC Managed Care – PPO | Source: Ambulatory Visit | Attending: Internal Medicine | Admitting: Internal Medicine

## 2011-07-01 VITALS — BP 108/76 | HR 80 | Temp 98.9°F | Resp 14 | Wt 178.5 lb

## 2011-07-01 DIAGNOSIS — R10816 Epigastric abdominal tenderness: Secondary | ICD-10-CM

## 2011-07-01 DIAGNOSIS — K219 Gastro-esophageal reflux disease without esophagitis: Secondary | ICD-10-CM

## 2011-07-01 LAB — AMYLASE: Amylase: 104 U/L (ref 27–131)

## 2011-07-01 LAB — CBC WITH DIFFERENTIAL/PLATELET
Basophils Absolute: 0 10*3/uL (ref 0.0–0.1)
Basophils Relative: 0.4 % (ref 0.0–3.0)
Eosinophils Relative: 1.4 % (ref 0.0–5.0)
HCT: 37 % (ref 36.0–46.0)
Hemoglobin: 12.8 g/dL (ref 12.0–15.0)
Lymphocytes Relative: 19.7 % (ref 12.0–46.0)
Lymphs Abs: 1.2 10*3/uL (ref 0.7–4.0)
Monocytes Relative: 8.8 % (ref 3.0–12.0)
Neutro Abs: 4.1 10*3/uL (ref 1.4–7.7)
RBC: 4.08 Mil/uL (ref 3.87–5.11)
RDW: 13.3 % (ref 11.5–14.6)
WBC: 5.9 10*3/uL (ref 4.5–10.5)

## 2011-07-01 LAB — URINALYSIS, ROUTINE W REFLEX MICROSCOPIC
Ketones, ur: NEGATIVE
Leukocytes, UA: NEGATIVE
Specific Gravity, Urine: 1.015 (ref 1.000–1.030)
Urine Glucose: NEGATIVE
Urobilinogen, UA: 0.2 (ref 0.0–1.0)

## 2011-07-01 LAB — HCG, QUANTITATIVE, PREGNANCY: hCG, Beta Chain, Quant, S: <2 m[IU]/mL

## 2011-07-01 LAB — TSH: TSH: 2.39 u[IU]/mL (ref 0.35–5.50)

## 2011-07-01 LAB — COMPREHENSIVE METABOLIC PANEL
AST: 19 U/L (ref 0–37)
Albumin: 4 g/dL (ref 3.5–5.2)
Alkaline Phosphatase: 40 U/L (ref 39–117)
BUN: 16 mg/dL (ref 6–23)
Calcium: 9 mg/dL (ref 8.4–10.5)
Creatinine, Ser: 0.8 mg/dL (ref 0.4–1.2)
Glucose, Bld: 105 mg/dL — ABNORMAL HIGH (ref 70–99)
Potassium: 3.8 mEq/L (ref 3.5–5.1)

## 2011-07-01 LAB — LIPASE: Lipase: 24 U/L (ref 11.0–59.0)

## 2011-07-01 MED ORDER — SUCRALFATE 1 G PO TABS: 1.0000 g | ORAL_TABLET | Freq: Four times a day (QID) | ORAL | Status: DC

## 2011-07-01 MED ORDER — ESOMEPRAZOLE MAGNESIUM 40 MG PO CPDR: 40.0000 mg | DELAYED_RELEASE_CAPSULE | Freq: Every day | ORAL | Status: DC

## 2011-07-01 NOTE — Patient Instructions (Signed)

## 2011-07-02 ENCOUNTER — Telehealth: Payer: Self-pay | Admitting: *Deleted

## 2011-07-02 NOTE — Telephone Encounter (Signed)
Pt informed of MD's advisement. 

## 2011-07-02 NOTE — Telephone Encounter (Signed)
She did not report any blood in her stool yesterday, she needs to try to work on the constipation with miralax, if she has persisted blood in stool then she needs to be seen for a repeat CBC

## 2011-07-02 NOTE — Assessment & Plan Note (Signed)
Start nexium 

## 2011-07-02 NOTE — Assessment & Plan Note (Signed)
I will start looking for causes of the pain, #1 concern is PUD due to cigarettes and nsaid so I asked her to start carafate and nexium. I don't see anything acute otherwise but will check her for pancreatitis, hepatitis, renal failure, blood loss, UTI, free air,  Stool burden and gallstones - diagnostics are ordered accordingly

## 2011-07-02 NOTE — Telephone Encounter (Signed)
Pt was seen in OV for abdominal pain. Pt states that she is still having abdominal pain today and that there is still bright colored blood in stool. Pt is asking for MD's advisement on what she can do regarding the abdominal pain.

## 2011-07-02 NOTE — Progress Notes (Signed)
Subjective:    Patient ID: [name], female    DOB: 01/22/1976, 35 y.o.   MRN: [id number]  Abdominal Pain This is a new problem. The current episode started yesterday. The onset quality is sudden. The problem occurs constantly. The problem has been unchanged. The pain is located in the epigastric region. The pain is at a severity of 1/10. The pain is mild. The quality of the pain is sharp. The abdominal pain does not radiate. Associated symptoms include constipation. Pertinent negatives include no anorexia, arthralgias, belching, diarrhea, dysuria, fever, flatus, frequency, headaches, hematochezia, hematuria, melena, myalgias, nausea, vomiting or weight loss. The pain is aggravated by nothing. The pain is relieved by nothing. She has tried nothing for the symptoms.      Review of Systems  Constitutional: Negative for fever, chills, weight loss, diaphoresis, activity change, appetite change, fatigue and unexpected weight change.  HENT: Negative.  Negative for sore throat and trouble swallowing.   Eyes: Negative.   Respiratory: Negative for cough, choking, chest tightness, shortness of breath, wheezing and stridor.   Gastrointestinal: Positive for abdominal pain and constipation. Negative for nausea, vomiting, diarrhea, blood in stool, melena, hematochezia, abdominal distention, anal bleeding, rectal pain, anorexia and flatus.  Genitourinary: Negative for dysuria, urgency, frequency, hematuria, flank pain, decreased urine volume, vaginal discharge, enuresis, difficulty urinating, genital sores, vaginal pain, menstrual problem, pelvic pain and dyspareunia.  Musculoskeletal: Negative for myalgias, back pain, joint swelling, arthralgias and gait problem.  Skin: Negative for color change, pallor, rash and wound.  Neurological: Negative for dizziness, tremors, seizures, syncope, facial asymmetry, speech difficulty, weakness, light-headedness, numbness and headaches.  Hematological: Negative for  adenopathy. Does not bruise/bleed easily.  Psychiatric/Behavioral: Negative.        Objective:   Physical Exam  Vitals reviewed. Constitutional: She is oriented to person, place, and time. She appears well-developed and well-nourished. No distress.  HENT:  Head: Normocephalic and atraumatic.  Mouth/Throat: Oropharynx is clear and moist. No oropharyngeal exudate.  Eyes: Conjunctivae are normal. Right eye exhibits no discharge. Left eye exhibits no discharge. No scleral icterus.  Neck: Normal range of motion. Neck supple. No JVD present. No tracheal deviation present. No thyromegaly present.  Cardiovascular: Normal rate, regular rhythm, normal heart sounds and intact distal pulses.  Exam reveals no gallop and no friction rub.   No murmur heard. Pulmonary/Chest: Effort normal and breath sounds normal. No stridor. No respiratory distress. She has no wheezes. She has no rales. She exhibits no tenderness.  Abdominal: Soft. Normal appearance. She exhibits no shifting dullness, no distension, no pulsatile liver, no fluid wave, no abdominal bruit, no ascites, no pulsatile midline mass and no mass. Bowel sounds are decreased. There is no hepatosplenomegaly. There is tenderness in the epigastric area. There is no rigidity, no rebound, no guarding, no CVA tenderness, no tenderness at McBurney's point and negative Murphy's sign. No hernia. Hernia confirmed negative in the ventral area.  Genitourinary: Rectum normal. Rectal exam shows no external hemorrhoid, no internal hemorrhoid, no fissure, no mass, no tenderness and anal tone normal. Guaiac negative stool.  Musculoskeletal: Normal range of motion. She exhibits no edema and no tenderness.  Lymphadenopathy:    She has no cervical adenopathy.  Neurological: She is oriented to person, place, and time.  Skin: Skin is warm and dry. No rash noted. She is not diaphoretic. No erythema. No pallor.  Psychiatric: She has a normal mood and affect. Her behavior is  normal. Judgment and thought content normal.  Assessment & Plan:

## 2011-07-05 ENCOUNTER — Ambulatory Visit
Admission: RE | Admit: 2011-07-05 | Discharge: 2011-07-05 | Disposition: A | Discharge status: Home / Self Care | Payer: BC Managed Care – PPO | Source: Ambulatory Visit | Attending: Internal Medicine | Admitting: Internal Medicine

## 2011-07-05 DIAGNOSIS — R10816 Epigastric abdominal tenderness: Secondary | ICD-10-CM

## 2011-08-06 ENCOUNTER — Other Ambulatory Visit: Payer: Self-pay | Admitting: Internal Medicine

## 2011-08-06 ENCOUNTER — Telehealth: Payer: Self-pay | Admitting: *Deleted

## 2011-08-06 NOTE — Telephone Encounter (Signed)
OK to fill this prescription with additional refills x1. Needs OV Thank you!  

## 2011-08-06 NOTE — Telephone Encounter (Signed)
      Requested Medications     NORCO 7.5-325 MG per tablet [Pharmacy Med Name: NORCO 7.5-325 TABLET]   TAKE 1 TABLET EVERY 8 HOURS AS NEEDED FOR PAIN.   Disp: 90 each Start: 08/06/2011  Class: Normal   Requested on: 05/18/2011   Originally ordered on: 12/08/2010  Last refill: 07/15/2011

## 2011-08-09 MED ORDER — HYDROCODONE-ACETAMINOPHEN 7.5-325 MG PO TABS: 1.0000 | ORAL_TABLET | Freq: Three times a day (TID) | ORAL | Status: DC | PRN

## 2011-08-10 ENCOUNTER — Telehealth: Payer: Self-pay | Admitting: *Deleted

## 2011-08-10 NOTE — Telephone Encounter (Signed)
Noted. Thx.

## 2011-08-10 NOTE — Telephone Encounter (Signed)
FYI: pt left vm stating she mistakenly requested Rf on vicodin. She state she does not need this yet and has enough to last her until her next OV.

## 2011-08-12 ENCOUNTER — Ambulatory Visit: Payer: BC Managed Care – PPO | Admitting: Internal Medicine

## 2011-09-20 ENCOUNTER — Encounter: Payer: Self-pay | Admitting: Internal Medicine

## 2011-09-20 ENCOUNTER — Ambulatory Visit (INDEPENDENT_AMBULATORY_CARE_PROVIDER_SITE_OTHER): Payer: BC Managed Care – PPO | Admitting: Internal Medicine

## 2011-09-20 DIAGNOSIS — M545 Low back pain, unspecified: Secondary | ICD-10-CM

## 2011-09-20 DIAGNOSIS — E538 Deficiency of other specified B group vitamins: Secondary | ICD-10-CM

## 2011-09-20 MED ORDER — VITAMIN D 1000 UNITS PO TABS: 1000.0000 [IU] | ORAL_TABLET | Freq: Every day | ORAL | Status: DC

## 2011-09-20 MED ORDER — VITAMIN B-12 500 MCG SL SUBL: 1.0000 | SUBLINGUAL_TABLET | SUBLINGUAL | Status: DC

## 2011-09-20 MED ORDER — HYDROCODONE-ACETAMINOPHEN 7.5-325 MG PO TABS: 1.0000 | ORAL_TABLET | Freq: Three times a day (TID) | ORAL | Status: DC | PRN

## 2011-09-20 NOTE — Assessment & Plan Note (Signed)
Continue with current prescription therapy as reflected on the Med list.  

## 2011-09-20 NOTE — Progress Notes (Signed)
Patient ID: [name], female   DOB: 06-26-1976, 36 y.o.   MRN: [id number]  Subjective:    Patient ID: [name], female    DOB: Jul 13, 1976, 36 y.o.   MRN: [id number]  HPI   Past Medical History  Diagnosis Date  . LBP (low back pain) 2011  . Vitamin B12 deficiency 2012  . Vitamin d deficiency 2012  . Depression      Review of Systems  Constitutional: Negative for fever.  HENT: Negative for ear pain.   Respiratory: Negative for choking, chest tightness and stridor.   Cardiovascular: Negative for chest pain and leg swelling.  Musculoskeletal: Positive for back pain and gait problem (some L hip pain). Negative for myalgias.  Neurological: Positive for numbness. Negative for seizures, syncope and headaches.  Psychiatric/Behavioral: Negative for suicidal ideas and sleep disturbance. The patient is not nervous/anxious.        Objective:   Physical Exam  Constitutional: She appears well-developed and well-nourished. No distress.  HENT:  Head: Normocephalic.  Right Ear: External ear normal.  Left Ear: External ear normal.  Nose: Nose normal.  Mouth/Throat: Oropharynx is clear and moist.  Eyes: Conjunctivae are normal. Pupils are equal, round, and reactive to light. Right eye exhibits no discharge. Left eye exhibits no discharge.  Neck: Normal range of motion. Neck supple. No JVD present. No tracheal deviation present. No thyromegaly present.  Cardiovascular: Normal rate, regular rhythm and normal heart sounds.   Pulmonary/Chest: No stridor. No respiratory distress. She has no wheezes.  Abdominal: Soft. Bowel sounds are normal. She exhibits no distension and no mass. There is no tenderness. There is no rebound and no guarding.  Musculoskeletal: She exhibits tenderness. She exhibits no edema.       LS is tender  Lymphadenopathy:    She has no cervical adenopathy.  Neurological: She displays normal reflexes. No cranial nerve deficit. She exhibits normal muscle tone.  Coordination normal.  Skin: No rash noted. No erythema.  Psychiatric: She has a normal mood and affect. Her behavior is normal. Judgment and thought content normal.   BP 122/80  Pulse 80  Temp(Src) 98.9 F (37.2 C) (Oral)  Resp 16  Wt 175 lb (79.379 kg) Constitutional: She is well-developed and well-nourished. No distress. nontoxic HENT: Head: Normocephalic and atraumatic. Ears: B TMs ok, no erythema or effusion; Nose: Nose normal.  Mouth/Throat: Oropharynx is red but clear and moist. No oropharyngeal exudate.  Eyes: Conjunctivae and EOM are normal. Pupils are equal, round, and reactive to light. No scleral icterus.  Neck: Normal range of motion. Neck supple. Mild anterior LAD, no JVD present. No thyromegaly present.  Cardiovascular: Normal rate, regular rhythm and normal heart sounds.  No murmur heard. No BLE edema. Pulmonary/Chest: Effort normal and breath sounds diminished at bases, few rhonchi. No respiratory distress. She has no wheezes.  Psychiatric: She has a normal mood and affect. Her behavior is normal. Judgment and thought content normal.   L LS and L SI is tender     Assessment & Plan:

## 2011-09-20 NOTE — Assessment & Plan Note (Signed)
Chronic, nonsurgical, MRI last in 2012- facet OA and disk dessication  Potential benefits of a long term opioids use as well as potential risks (i.e. addiction risk, apnea etc) and complications (i.e. Somnolence, constipation and others) were explained to the patient and were aknowledged. Continue with current prescription therapy as reflected on the Med list.

## 2011-10-05 ENCOUNTER — Ambulatory Visit (INDEPENDENT_AMBULATORY_CARE_PROVIDER_SITE_OTHER): Payer: BC Managed Care – PPO | Admitting: Internal Medicine

## 2011-10-05 ENCOUNTER — Encounter: Payer: Self-pay | Admitting: Internal Medicine

## 2011-10-05 VITALS — BP 120/88 | HR 88 | Temp 98.6°F | Resp 16 | Wt 171.0 lb

## 2011-10-05 DIAGNOSIS — J209 Acute bronchitis, unspecified: Secondary | ICD-10-CM

## 2011-10-05 DIAGNOSIS — R062 Wheezing: Secondary | ICD-10-CM

## 2011-10-05 MED ORDER — BENZONATATE 200 MG PO CAPS
200.0000 mg | ORAL_CAPSULE | Freq: Two times a day (BID) | ORAL | Status: AC | PRN
Start: 2011-10-05 — End: 2011-10-12

## 2011-10-05 MED ORDER — AZITHROMYCIN 250 MG PO TABS: ORAL_TABLET | ORAL | Status: AC

## 2011-10-05 NOTE — Patient Instructions (Signed)
advair 1 twice a day Use over-the-counter  "cold" medicines  such as "Tylenol cold" , "Advil cold",  "Mucinex" or" Mucinex D"  for cough and congestion. Avoid decongestants if you have high blood pressure and use "Afrin" nasal spray for nasal congestion as directed instead. Use" Delsym" or" Robitussin" cough syrup varietis for cough.  You can use plain "Tylenol" or "Advi"l for fever, chills and achyness.   "Common cold" symptoms are usually triggered by a virus.  The antibiotics are usually not necessary. On average, a" viral cold" illness would take 4-7 days to resolve. Please, make an appointment if you are not better or if you're worse.

## 2011-10-05 NOTE — Progress Notes (Signed)
  Subjective:    Patient ID: [name], female    DOB: July 07, 1976, 36 y.o.   MRN: [id number]  HPI   HPI  C/o URI sx's x 5  days. C/o ST, cough, weakness. Not better with OTC medicines. Actually, the patient is getting worse. The patient did not sleep last night due to cough.  Review of Systems  Constitutional: Positive for fever, chills and fatigue.  HENT: Positive for congestion, rhinorrhea, sneezing and postnasal drip.   Eyes: Positive for photophobia and pain. Negative for discharge and visual disturbance.  Respiratory: Positive for cough and wheezing.   Positive for chest pain.  Gastrointestinal: Negative for vomiting, abdominal pain, diarrhea and abdominal distention.  Genitourinary: Negative for dysuria and difficulty urinating.  Skin: Negative for rash.  Neurological: Positive for dizziness, weakness and light-headedness.      Review of Systems     Objective:   Physical Exam  Constitutional: She appears well-developed. No distress.  HENT:  Right Ear: External ear normal.  Left Ear: External ear normal.  Mouth/Throat: No oropharyngeal exudate.       eryth throat  Cardiovascular: Normal rate and normal heart sounds.   Pulmonary/Chest: She has no wheezes. She has no rales.  Musculoskeletal: She exhibits no edema.          Assessment & Plan:

## 2011-10-06 ENCOUNTER — Encounter: Payer: Self-pay | Admitting: Internal Medicine

## 2011-10-06 NOTE — Assessment & Plan Note (Signed)
Start abx

## 2011-10-06 NOTE — Assessment & Plan Note (Signed)
3/13 asthmatic cough Start  Advair bid Tessalon

## 2011-11-26 ENCOUNTER — Other Ambulatory Visit: Payer: Self-pay | Admitting: *Deleted

## 2011-11-26 MED ORDER — NAPROXEN 500 MG PO TABS: 500.0000 mg | ORAL_TABLET | Freq: Two times a day (BID) | ORAL | Status: DC

## 2011-11-26 NOTE — Telephone Encounter (Signed)
Very sorry, but we cannot support increased use of controlled substance beyond what has been prescribed due to risk of dependence, tolerance and withdrawal  I did rx naproxyn bid prn - done per emr;  Pt to try this;  If cont's to have uncontroled pain, will need to see Dr Posey Rea to discuss pain management

## 2011-11-26 NOTE — Telephone Encounter (Signed)
Pt left vm requesting early Rf on Hydroco/APAP 7.5/325mg . Ok to Rf? to [facility]

## 2011-11-29 ENCOUNTER — Telehealth: Payer: Self-pay | Admitting: *Deleted
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
## 2011-11-29 NOTE — Telephone Encounter (Signed)
Dr. Posey Rea- please advise on below request.

## 2011-11-29 NOTE — Telephone Encounter (Signed)
Ok to fill early if it is just a 3-4 d early No early ref in the future I read her letter: not sure of the exact cause. Cut back on exercise intensity. Sch OV Start Flexeril 1-2 tabs at hs OK MRI copy Thx

## 2011-11-30 NOTE — Telephone Encounter (Signed)
Called pt- see other phone note dated 11-26-11.

## 2011-11-30 NOTE — Telephone Encounter (Signed)
Rf of Hydroco/APAP called to [facility]. Pt informed of below. What about MRI copy? I dont see MRI results.

## 2011-11-30 NOTE — Telephone Encounter (Signed)
Noted. Thx.

## 2011-12-01 MED ORDER — CYCLOBENZAPRINE HCL 5 MG PO TABS: 5.0000 mg | ORAL_TABLET | Freq: Every evening | ORAL | Status: DC | PRN

## 2011-12-08 ENCOUNTER — Telehealth: Payer: Self-pay | Admitting: *Deleted

## 2011-12-08 NOTE — Telephone Encounter (Signed)
Pt requesting early Rf on Hydroco/APAP 7.5/325mg . Per refill note dated 11/26/11 ok to fill early per AVP. [facility] informed to fill today.

## 2011-12-24 ENCOUNTER — Telehealth: Payer: Self-pay | Admitting: *Deleted

## 2011-12-24 ENCOUNTER — Other Ambulatory Visit: Payer: Self-pay | Admitting: Internal Medicine

## 2011-12-24 NOTE — Telephone Encounter (Signed)
Please advise on below request. Med was D/C due to side effects. Ok to Rf?    Requested Medications     MOBIC 15 MG tablet [Pharmacy Med Name: MOBIC 15 MG TABLET]   TAKE 1 TABLET ONCE DAILY AS NEEDED FOR PAIN.   Disp: 30 each Start: 12/24/2011  Class: Normal   Requested on: 04/26/2011   Last refill: 11/03/2011

## 2011-12-26 NOTE — Telephone Encounter (Signed)
What were the side effects? Thx

## 2011-12-27 ENCOUNTER — Other Ambulatory Visit: Payer: Self-pay | Admitting: Internal Medicine

## 2011-12-27 NOTE — Telephone Encounter (Signed)
Called pt- no answer/unable to leave mess. Not at work today until 5 pm. Left mess with her co-worker to have her to call us back re: below.

## 2011-12-27 NOTE — Telephone Encounter (Signed)
I spoke to pt- she states she does not need mobic. She was able to get Rf on naproxen instead.

## 2011-12-31 ENCOUNTER — Telehealth: Payer: Self-pay

## 2011-12-31 MED ORDER — HYDROCODONE-ACETAMINOPHEN 7.5-325 MG PO TABS: 1.0000 | ORAL_TABLET | Freq: Four times a day (QID) | ORAL | Status: DC | PRN

## 2011-12-31 NOTE — Telephone Encounter (Signed)
Rx faxed to pharmacy. Left detailed mess informing pt. 

## 2011-12-31 NOTE — Telephone Encounter (Signed)
OK  OV this or next mo Thx

## 2011-12-31 NOTE — Telephone Encounter (Signed)
Pt called stating that she is still experiencing persistent LBP. Pt is requesting Rx refill of Hydrocodone and adjustment in directions - QID instead of TID. Please advise, pt will be out by mid week next week.

## 2012-01-11 ENCOUNTER — Ambulatory Visit (INDEPENDENT_AMBULATORY_CARE_PROVIDER_SITE_OTHER): Payer: BC Managed Care – PPO | Admitting: Family Medicine

## 2012-01-11 ENCOUNTER — Ambulatory Visit: Payer: BC Managed Care – PPO

## 2012-01-11 VITALS — BP 128/83 | HR 65 | Temp 98.3°F | Resp 16 | Ht 67.5 in | Wt 169.0 lb

## 2012-01-11 DIAGNOSIS — R109 Unspecified abdominal pain: Secondary | ICD-10-CM

## 2012-01-11 DIAGNOSIS — K5900 Constipation, unspecified: Secondary | ICD-10-CM

## 2012-01-11 LAB — POCT URINALYSIS DIPSTICK
Bilirubin, UA: NEGATIVE
Ketones, UA: NEGATIVE
pH, UA: 8

## 2012-01-11 LAB — POCT CBC
HCT, POC: 42.7 % (ref 37.7–47.9)
Hemoglobin: 13.6 g/dL (ref 12.2–16.2)
Lymph, poc: 1.3 (ref 0.6–3.4)
MCHC: 31.9 g/dL (ref 31.8–35.4)
POC Granulocyte: 6.2 (ref 2–6.9)

## 2012-01-11 LAB — POCT UA - MICROSCOPIC ONLY: Mucus, UA: NEGATIVE

## 2012-01-11 NOTE — Progress Notes (Signed)
Subjective: 36 year old lady who has been having abdominal pain since last night. Does not have a history of chronic abdominal pain. Yesterday she only had a couple of little snack type foods, did not eat really. Last night she developed abdominal pain across the mid and low abdomen more on the right side. No nausea or vomiting. No dysuria. She had no bowel movement this morning which did not give relief. She hurt all night, keep your up awake late and waking her up early. She was too uncomfortable to sit up. She denies any fever. She does not have a history of having these chest pains her only surgery was a C-section. Her husband has had a vasectomy. Her last menstrual cycle was the end of last month.  Works as a Associate Professor at [employer] present abdomen was flat, normal in appearance. Soft without organomegaly or masses. She is tender across the mid abdomen, more in the right side however maintenances down the right side. Has some minimal suprapubic tenderness negative rebound. She says the pain is 3/10 when she is resting but 8/10 when I palpate her.  Assessment: Abdominal pain, etiology unclear. Doubt surgical abdomen.  Plan: Abdominal series, CBC, urinalysis  Results for orders placed in visit on 01/11/12  POCT CBC      Component Value Range   WBC 7.9  4.6 - 10.2 K/uL   Lymph, poc 1.3  0.6 - 3.4   POC LYMPH PERCENT 16.5  10 - 50 %L   MID (cbc) 0.4  0 - 0.9   POC MID % 5.5  0 - 12 %M   POC Granulocyte 6.2  2 - 6.9   Granulocyte percent 78.0  37 - 80 %G   RBC 4.59  4.04 - 5.48 M/uL   Hemoglobin 13.6  12.2 - 16.2 g/dL   HCT, POC 21.3  08.6 - 47.9 %   MCV 93.1  80 - 97 fL   MCH, POC 29.6  27 - 31.2 pg   MCHC 31.9  31.8 - 35.4 g/dL   RDW, POC 57.8     Platelet Count, POC 271  142 - 424 K/uL   MPV 8.6  0 - 99.8 fL  POCT URINALYSIS DIPSTICK      Component Value Range   Color, UA yellow     Clarity, UA clear     Glucose, UA neg     Bilirubin, UA neg     Ketones, UA neg     Spec Grav, UA 1.010     Blood, UA neg     pH, UA 8.0     Protein, UA neg     Urobilinogen, UA 0.2     Nitrite, UA neg     Leukocytes, UA Trace    POCT UA - MICROSCOPIC ONLY      Component Value Range   WBC, Ur, HPF, POC 2-5     RBC, urine, microscopic 0-3     Bacteria, U Microscopic 1+     Mucus, UA neg     Epithelial cells, urine per micros 4-6     Crystals, Ur, HPF, POC neg     Casts, Ur, LPF, POC neg     Yeast, UA neg     Xray:  Nonspecific gas/stool  Stool LUQ  Constipation Abdominal pain  Miralax

## 2012-01-11 NOTE — Patient Instructions (Signed)
Increase fluids and fiber Miralax  Return or go to emergency if worse.

## 2012-01-12 ENCOUNTER — Telehealth: Payer: Self-pay

## 2012-01-12 LAB — COMPREHENSIVE METABOLIC PANEL
BUN: 11 mg/dL (ref 6–23)
CO2: 25 mEq/L (ref 19–32)
Calcium: 9.4 mg/dL (ref 8.4–10.5)
Chloride: 103 mEq/L (ref 96–112)
Creat: 0.82 mg/dL (ref 0.50–1.10)
Glucose, Bld: 88 mg/dL (ref 70–99)

## 2012-01-12 NOTE — Telephone Encounter (Signed)
[facility] Triage Call Report Triage Record Num: [id number] Operator: Migdalia Dk Patient Name: [name] Call Date & Time: 01/10/2012 11:03:53PM Patient Phone: [phone] PCP: Sonda Primes Patient Gender: Female PCP Fax : [phone] Patient DOB: Sep 08, 1975 [facility] Name: [facility] Reason for Call: Caller: [name]/Patient; PCP: Sonda Primes; CB#: [phone]; Call regarding Abd Pain; Started with abdominal pain around 2030 tonight. "Now my whole stomach is tender to touch and I have a hard time sitting up." Last BM:01/10/2012, LMP: " a week and a half ago." Severe pain with any movement or sitting up. Pt. advised to go to ER for evaluation. Protocol(s) Used: Abdominal Pain Recommended Outcome per Protocol: See ED Immediately Reason for Outcome: Unbearable abdominal/pelvic pain Care Advice: ~ 01/10/2012 11:12:12PM Page 1 of 1 [email or web address]

## 2012-02-09 ENCOUNTER — Ambulatory Visit (INDEPENDENT_AMBULATORY_CARE_PROVIDER_SITE_OTHER): Payer: BC Managed Care – PPO | Admitting: Internal Medicine

## 2012-02-09 ENCOUNTER — Encounter: Payer: Self-pay | Admitting: Internal Medicine

## 2012-02-09 VITALS — BP 130/80 | HR 92 | Temp 98.7°F | Resp 16 | Wt 171.0 lb

## 2012-02-09 DIAGNOSIS — M722 Plantar fascial fibromatosis: Secondary | ICD-10-CM | POA: Insufficient documentation

## 2012-02-09 DIAGNOSIS — F32A Depression, unspecified: Secondary | ICD-10-CM

## 2012-02-09 DIAGNOSIS — M545 Low back pain, unspecified: Secondary | ICD-10-CM

## 2012-02-09 DIAGNOSIS — E538 Deficiency of other specified B group vitamins: Secondary | ICD-10-CM

## 2012-02-09 DIAGNOSIS — F329 Major depressive disorder, single episode, unspecified: Secondary | ICD-10-CM

## 2012-02-09 MED ORDER — HYDROCODONE-ACETAMINOPHEN 7.5-325 MG PO TABS: 1.0000 | ORAL_TABLET | Freq: Four times a day (QID) | ORAL | Status: DC | PRN

## 2012-02-09 MED ORDER — GABAPENTIN 100 MG PO CAPS: 100.0000 mg | ORAL_CAPSULE | Freq: Three times a day (TID) | ORAL | Status: DC

## 2012-02-09 NOTE — Assessment & Plan Note (Signed)
On Lexapro 

## 2012-02-09 NOTE — Assessment & Plan Note (Signed)
Continue with current prescription therapy as reflected on the Med list. Add Gabapentin

## 2012-02-09 NOTE — Assessment & Plan Note (Signed)
Chronic. 

## 2012-02-09 NOTE — Assessment & Plan Note (Signed)
Discussed Ice prn, good shoes

## 2012-02-09 NOTE — Progress Notes (Signed)
  Subjective:    Patient ID: [name], female    DOB: 12/02/1975, 36 y.o.   MRN: [id number]  Back Pain This is a chronic problem. The current episode started more than 1 year ago. The problem occurs constantly. The pain is present in the lumbar spine and gluteal. The quality of the pain is described as aching. The pain is at a severity of 7/10. The pain is moderate. The pain is worse during the day. The symptoms are aggravated by standing. Associated symptoms include numbness. Pertinent negatives include no chest pain, fever or headaches. The treatment provided moderate relief.   F/u depression. C/o B heel pain   Wt Readings from Last 3 Encounters:  02/09/12 171 lb (77.565 kg)  01/11/12 169 lb (76.658 kg)  10/05/11 171 lb (77.565 kg)   BP Readings from Last 3 Encounters:  02/09/12 130/80  01/11/12 128/83  10/05/11 120/88      Past Medical History  Diagnosis Date  . LBP (low back pain) 2011  . Vitamin B12 deficiency 2012  . Vitamin d deficiency 2012  . Depression      Review of Systems  Constitutional: Negative for fever.  HENT: Negative for ear pain.   Respiratory: Negative for choking, chest tightness and stridor.   Cardiovascular: Negative for chest pain and leg swelling.  Musculoskeletal: Positive for back pain and gait problem (some L hip pain). Negative for myalgias.  Skin: Negative for wound.  Neurological: Positive for numbness. Negative for seizures, syncope and headaches.  Psychiatric/Behavioral: Negative for suicidal ideas and disturbed wake/sleep cycle. The patient is not nervous/anxious.        Objective:   Physical Exam  Constitutional: She appears well-developed and well-nourished. No distress.  HENT:  Head: Normocephalic.  Right Ear: External ear normal.  Left Ear: External ear normal.  Nose: Nose normal.  Mouth/Throat: Oropharynx is clear and moist.  Eyes: Conjunctivae are normal. Pupils are equal, round, and reactive to light. Right eye  exhibits no discharge. Left eye exhibits no discharge.  Neck: Normal range of motion. Neck supple. No JVD present. No tracheal deviation present. No thyromegaly present.  Cardiovascular: Normal rate, regular rhythm and normal heart sounds.   Pulmonary/Chest: No stridor. No respiratory distress. She has no wheezes.  Abdominal: Soft. Bowel sounds are normal. She exhibits no distension and no mass. There is no tenderness. There is no rebound and no guarding.  Musculoskeletal: She exhibits tenderness. She exhibits no edema.       LS is tender  Lymphadenopathy:    She has no cervical adenopathy.  Neurological: She displays normal reflexes. No cranial nerve deficit. She exhibits normal muscle tone. Coordination normal.  Skin: No rash noted. No erythema.  Psychiatric: She has a normal mood and affect. Her behavior is normal. Judgment and thought content normal.   B heels are tender to palp L LS and L SI is tender  Lab Results  Component Value Date   WBC 7.9 01/11/2012   HGB 13.6 01/11/2012   HCT 42.7 01/11/2012   PLT 262.0 07/01/2011   GLUCOSE 88 01/11/2012   ALT 9 01/11/2012   AST 15 01/11/2012   NA 137 01/11/2012   K 4.5 01/11/2012   CL 103 01/11/2012   CREATININE 0.82 01/11/2012   BUN 11 01/11/2012   CO2 25 01/11/2012   TSH 2.39 07/01/2011        Assessment & Plan:

## 2012-02-09 NOTE — Assessment & Plan Note (Signed)
Continue with current prescription therapy as reflected on the Med list.  

## 2012-02-17 ENCOUNTER — Telehealth: Payer: Self-pay | Admitting: Internal Medicine

## 2012-02-17 NOTE — Telephone Encounter (Signed)
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 Month, Went To A. Dentist, They Advised Her To Call Around for an Oral Surgeon, But She Doesn T Want Surgery, She Just Wants To Know What s causing this.  Pt. states she doesn't want to go to [facility] for an oral surgery appt.  She has also been advised to get a bite guard, and Pt. does not believe she needs this because she doesn't grind her teeth.   Triaged per Jaw SX and disposition for:  Unable to open or close mouth fully:  See ED Immediately.  Pt. states she will not go to ED for this as she has sufferred from this locked jaw for over a month.  Pt. would like to know if Dr. Posey Rea cold give any advice.   CAN/DB

## 2012-02-18 ENCOUNTER — Encounter: Payer: Self-pay | Admitting: Internal Medicine

## 2012-02-18 ENCOUNTER — Ambulatory Visit (INDEPENDENT_AMBULATORY_CARE_PROVIDER_SITE_OTHER): Payer: BC Managed Care – PPO | Admitting: Internal Medicine

## 2012-02-18 VITALS — BP 130/90 | HR 88 | Temp 98.9°F | Resp 16

## 2012-02-18 DIAGNOSIS — S0300XA Dislocation of jaw, unspecified side, initial encounter: Secondary | ICD-10-CM

## 2012-02-18 MED ORDER — DIAZEPAM 2 MG PO TABS: 2.0000 mg | ORAL_TABLET | Freq: Every evening | ORAL | Status: AC | PRN

## 2012-02-18 NOTE — Telephone Encounter (Signed)
Patient scheduled for 4:15pm.

## 2012-02-18 NOTE — Telephone Encounter (Signed)
Did she have it on 7/17 when I saw her? I can work her in and see if  I can help Thx

## 2012-02-21 ENCOUNTER — Encounter: Payer: Self-pay | Admitting: Internal Medicine

## 2012-02-21 DIAGNOSIS — S0300XA Dislocation of jaw, unspecified side, initial encounter: Secondary | ICD-10-CM | POA: Insufficient documentation

## 2012-02-21 NOTE — Progress Notes (Signed)
Patient ID: [name], female   DOB: 05-Jan-1976, 36 y.o.   MRN: [id number]  Subjective:    Patient ID: [name], female    DOB: 12/05/1975, 36 y.o.   MRN: [id number]  C/o inability to open her mouth wide and having her bite off center x 1 mo   Back Pain This is a chronic problem. The current episode started more than 1 year ago. The problem occurs constantly. The pain is present in the lumbar spine and gluteal. The quality of the pain is described as aching. The pain is at a severity of 7/10. The pain is moderate. The pain is worse during the day. The symptoms are aggravated by standing. Associated symptoms include numbness. Pertinent negatives include no chest pain, fever or headaches. The treatment provided moderate relief.   F/u depression. C/o B heel pain   Wt Readings from Last 3 Encounters:  02/09/12 171 lb (77.565 kg)  01/11/12 169 lb (76.658 kg)  10/05/11 171 lb (77.565 kg)   BP Readings from Last 3 Encounters:  02/18/12 130/90  02/09/12 130/80  01/11/12 128/83      Past Medical History  Diagnosis Date  . LBP (low back pain) 2011  . Vitamin B12 deficiency 2012  . Vitamin d deficiency 2012  . Depression      Review of Systems  Constitutional: Negative for fever.  HENT: Negative for ear pain.   Respiratory: Negative for choking, chest tightness and stridor.   Cardiovascular: Negative for chest pain and leg swelling.  Musculoskeletal: Positive for back pain and gait problem (some L hip pain). Negative for myalgias.  Skin: Negative for wound.  Neurological: Positive for numbness. Negative for seizures, syncope and headaches.  Psychiatric/Behavioral: Negative for suicidal ideas and disturbed wake/sleep cycle. The patient is not nervous/anxious.        Objective:   Physical Exam  Constitutional: She appears well-developed and well-nourished. No distress.  HENT:  Head: Normocephalic.  Right Ear: External ear normal.  Left Ear: External ear normal.  Nose:  Nose normal.  Mouth/Throat: Oropharynx is clear and moist.  Eyes: Conjunctivae are normal. Pupils are equal, round, and reactive to light. Right eye exhibits no discharge. Left eye exhibits no discharge.  Neck: Normal range of motion. Neck supple. No JVD present. No tracheal deviation present. No thyromegaly present.  Cardiovascular: Normal rate, regular rhythm and normal heart sounds.   Pulmonary/Chest: No stridor. No respiratory distress. She has no wheezes.  Abdominal: Soft. Bowel sounds are normal. She exhibits no distension and no mass. There is no tenderness. There is no rebound and no guarding.  Musculoskeletal: She exhibits tenderness. She exhibits no edema.       LS is tender  Lymphadenopathy:    She has no cervical adenopathy.  Neurological: She displays normal reflexes. No cranial nerve deficit. She exhibits normal muscle tone. Coordination normal.  Skin: No rash noted. No erythema.  Psychiatric: She has a normal mood and affect. Her behavior is normal. Judgment and thought content normal.   B heels are tender to palp L LS and L SI is tender  Mouth opening 2.5 cm max Bite is off center a couple mm's  Lab Results  Component Value Date   WBC 7.9 01/11/2012   HGB 13.6 01/11/2012   HCT 42.7 01/11/2012   PLT 262.0 07/01/2011   GLUCOSE 88 01/11/2012   ALT 9 01/11/2012   AST 15 01/11/2012   NA 137 01/11/2012   K 4.5 01/11/2012   CL 103 01/11/2012  CREATININE 0.82 01/11/2012   BUN 11 01/11/2012   CO2 25 01/11/2012   TSH 2.39 07/01/2011        Assessment & Plan:

## 2012-02-21 NOTE — Assessment & Plan Note (Signed)
7/13 partial, subacute Oral surgery consult Diazepam prn

## 2012-04-14 ENCOUNTER — Other Ambulatory Visit: Payer: Self-pay

## 2012-04-18 ENCOUNTER — Telehealth: Payer: Self-pay | Admitting: *Deleted

## 2012-04-18 NOTE — Telephone Encounter (Signed)
I called [facility] and spoke to [place], Rph. (this is the pharmacy where pt wants med filled) Irving Burton states pt just received # 120 on 02/27/12 and #120 on 03/23/12. So, technically she is not due to fill until 04/26/12. If you are still ok with her to fill now Irving Burton states she will fill it but you must call or fax them.

## 2012-04-18 NOTE — Telephone Encounter (Signed)
Pt came into office requesting early Rf on Norco 7.5/325mg . I called [facility] at [phone] and pharmacist states they can fill it on 04/19/12. Pt informed

## 2012-04-20 ENCOUNTER — Telehealth: Payer: Self-pay | Admitting: *Deleted

## 2012-04-20 NOTE — Telephone Encounter (Signed)
Pt want Refill to go to [facility] [address] Please advise on below early Rf. Dr. Posey Rea must fax or call pharmacy to authorize early Refill.

## 2012-04-20 NOTE — Telephone Encounter (Signed)
[name], [facility] 04/18/2012 4:15 PM Signed  I called [facility] and spoke to Danae Chen. (this is the pharmacy where pt wants med filled) Irving Burton states pt just received # 120 on 02/27/12 and #120 on 03/23/12. So, technically she is not due to fill until 04/26/12. If you are still ok with her to fill now Irving Burton states she will fill it but you must call or fax them.   [name], [facility] 04/18/2012 1:44 PM Signed  Pt came into office requesting early Rf on Norco 7.5/325mg . I called [facility] at [phone] and pharmacist states they can fill it on 04/19/12. Pt informed

## 2012-04-21 NOTE — Telephone Encounter (Signed)
Rx has to be filled q 30 d OK this time - pls tell pt - refills every 30 d

## 2012-04-21 NOTE — Telephone Encounter (Signed)
I called pt to inform of Dr. Loren Racer response. She now states she is going to wait until 04/26/12 to fill the Norco and for me to not contact pharmacy at this time. Closing phone note.

## 2012-05-04 ENCOUNTER — Other Ambulatory Visit: Payer: Self-pay

## 2012-05-09 MED ORDER — HYDROCODONE-ACETAMINOPHEN 7.5-325 MG PO TABS: 1.0000 | ORAL_TABLET | Freq: Four times a day (QID) | ORAL | Status: AC | PRN

## 2012-05-15 ENCOUNTER — Telehealth: Payer: Self-pay | Admitting: Internal Medicine

## 2012-05-15 DIAGNOSIS — M545 Low back pain, unspecified: Secondary | ICD-10-CM

## 2012-05-15 NOTE — Telephone Encounter (Signed)
The patient is hoping to get a referral to a orthopedic dr for her back.  She is also hoping to get a increased dosage on her pain medicine because she stated it did not work this weekend.   Pt's call back - [phone]

## 2012-05-16 MED ORDER — HYDROCODONE-ACETAMINOPHEN 10-325 MG PO TABS: 1.0000 | ORAL_TABLET | Freq: Four times a day (QID) | ORAL | Status: DC | PRN

## 2012-05-16 NOTE — Telephone Encounter (Signed)
Left detailed mess re: below asking pt to call back re: referral and to see how she wants to get Rx.  Rx upfront for p/u.

## 2012-05-16 NOTE — Telephone Encounter (Signed)
I incr he dose to Hydroc/APP 10/325 I would like to have her ee a pain specialist - Dr Victoriano Lain - is it ok to sch? Thx

## 2012-05-18 NOTE — Telephone Encounter (Signed)
Yes, pt wants referral. 

## 2012-05-18 NOTE — Telephone Encounter (Signed)
Done

## 2012-05-26 ENCOUNTER — Encounter: Payer: Self-pay | Admitting: Physical Medicine and Rehabilitation

## 2012-06-05 ENCOUNTER — Encounter
Payer: BC Managed Care – PPO | Attending: Physical Medicine and Rehabilitation | Admitting: Physical Medicine and Rehabilitation

## 2012-06-05 ENCOUNTER — Encounter: Payer: Self-pay | Admitting: Physical Medicine and Rehabilitation

## 2012-06-05 VITALS — BP 124/70 | HR 85 | Resp 14 | Ht 66.0 in | Wt 169.0 lb

## 2012-06-05 DIAGNOSIS — G8929 Other chronic pain: Secondary | ICD-10-CM | POA: Insufficient documentation

## 2012-06-05 DIAGNOSIS — M79609 Pain in unspecified limb: Secondary | ICD-10-CM | POA: Insufficient documentation

## 2012-06-05 DIAGNOSIS — M5432 Sciatica, left side: Secondary | ICD-10-CM

## 2012-06-05 DIAGNOSIS — M5430 Sciatica, unspecified side: Secondary | ICD-10-CM

## 2012-06-05 DIAGNOSIS — M5126 Other intervertebral disc displacement, lumbar region: Secondary | ICD-10-CM

## 2012-06-05 DIAGNOSIS — M79605 Pain in left leg: Secondary | ICD-10-CM

## 2012-06-05 DIAGNOSIS — Z5181 Encounter for therapeutic drug level monitoring: Secondary | ICD-10-CM

## 2012-06-05 DIAGNOSIS — M48061 Spinal stenosis, lumbar region without neurogenic claudication: Secondary | ICD-10-CM

## 2012-06-05 DIAGNOSIS — IMO0001 Reserved for inherently not codable concepts without codable children: Secondary | ICD-10-CM | POA: Insufficient documentation

## 2012-06-05 DIAGNOSIS — M545 Low back pain, unspecified: Secondary | ICD-10-CM | POA: Insufficient documentation

## 2012-06-05 MED ORDER — METHYLPREDNISOLONE 4 MG PO KIT: PACK | ORAL | Status: DC

## 2012-06-05 NOTE — Patient Instructions (Addendum)
I understand your having pain in your low back and your left leg.  I have ordered some physical therapy and started you on a Medrol dose pack.  I have ordered a lumbar MRI.  We are also checking a UDS.  We may consider epidural steroid injection if you're not improving.
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
## 2012-06-08 ENCOUNTER — Other Ambulatory Visit: Payer: Self-pay

## 2012-06-08 ENCOUNTER — Telehealth: Payer: Self-pay

## 2012-06-08 ENCOUNTER — Ambulatory Visit (HOSPITAL_COMMUNITY): Payer: BC Managed Care – PPO

## 2012-06-08 NOTE — Telephone Encounter (Signed)
Patient UDS was positive for amphetamine.  Asked her if she has a script for this, she said she does not but her daughter has a script for vyvanse.  She thinks maybe she switched pills.  Patient was also positive for alcohol.  Advised her of the risks of drinking while on narcotics.  Please advise.

## 2012-06-13 ENCOUNTER — Telehealth: Payer: Self-pay

## 2012-06-13 ENCOUNTER — Telehealth: Payer: Self-pay | Admitting: *Deleted

## 2012-06-13 NOTE — Telephone Encounter (Signed)
Patient has finished her prednisone.  She has questions about other medications?  Please call.  FYI patients UDS came back positive for amphetamine and she says she thinks she mixed up her gabapentin and her daughters medication.

## 2012-06-13 NOTE — Telephone Encounter (Signed)
Pt calling to see if she can a Rf on her Norco today as it is due to fill on 06/16/12. She states she has seen specialist and is having a MRI on 06/27/12. She states she just completed the prednisone pack. Please advise on Rf.

## 2012-06-13 NOTE — Telephone Encounter (Signed)
OK to fill this prescription with additional refills x0 Thank you!  

## 2012-06-14 ENCOUNTER — Telehealth: Payer: Self-pay

## 2012-06-14 MED ORDER — HYDROCODONE-ACETAMINOPHEN 10-325 MG PO TABS: 1.0000 | ORAL_TABLET | Freq: Four times a day (QID) | ORAL | Status: DC | PRN

## 2012-06-14 NOTE — Telephone Encounter (Signed)
Patient called to see who is treating her back.  She is getting an MRI at the beginning of the year.  She was in so much pain last night she wanted to go to the ER.  Advised her we are reviewing her UDS and Dr Pamelia Hoit is reviewing her notes.  Will call her with a response.

## 2012-06-14 NOTE — Telephone Encounter (Signed)
[facility] Triage Call Report Triage Record Num: [id number] Operator: Kelle Darting Patient Name: Peniel Biel Call Date & Time: 06/13/2012 10:00:33PM Patient Phone: [phone] PCP: Sonda Primes Patient Gender: Female PCP Fax : [phone] Patient DOB: 08-Jan-1976 [facility] Name[facility] Reason for Call: Caller: [place]/Patient; PCP: Sonda Primes (Adults only); CB#: [phone]; Call regarding Back Pain; Afebrile; LMP: 05/22/12; Onset: 06/13/12 at about 2100; Sx notes: Left leg feels tingly (for the past several weeks), was seen by a specialist last week and was placed on Prednisone (completed 06/11/12); for the past two nights after working standing, has had bad pain after getting in her car to drive home, pain is worse when sitting, so bad tonight she is laying on the ground outside her home; also having urinary frequency that she has noticed tonight; has had blood in her stool off and on for about 1 year; Guideline used: Back symptoms; Disposition: See provider within 4 hours due to low back pain and urinary tract symptoms; Appt. made: No; Patient is going to go to [facility] on [address] for evaluation. Protocol(s) Used: Back Symptoms Recommended Outcome per Protocol: See Provider within 4 hours Reason for Outcome: Low back pain AND urinary tract symptoms Care Advice: ~ 06/13/2012 10:23:52PM Page 1 of 1 [email or web address]

## 2012-06-14 NOTE — Telephone Encounter (Signed)
Done. Pt informed.

## 2012-06-14 NOTE — Telephone Encounter (Signed)
Lm advising patient due to her blood in urine she should see her PCP as directed.  Also due to her inconsistent UDS we will not prescribe narcotic medications and need to have MRI results before any treatment.

## 2012-06-14 NOTE — Telephone Encounter (Signed)
Noted. Thx.

## 2012-06-15 NOTE — Telephone Encounter (Signed)
Patient will follow up with PCP on blood in urine.  Then she will follow up with [place] once she get the MRI.  She will be non-narcatic due to inconsistent UDS

## 2012-06-26 ENCOUNTER — Ambulatory Visit (HOSPITAL_COMMUNITY)
Admission: RE | Admit: 2012-06-26 | Discharge: 2012-06-26 | Disposition: A | Discharge status: Home / Self Care | Payer: BC Managed Care – PPO | Source: Ambulatory Visit | Attending: Physical Medicine and Rehabilitation | Admitting: Physical Medicine and Rehabilitation

## 2012-06-26 ENCOUNTER — Ambulatory Visit: Payer: BC Managed Care – PPO | Admitting: Physical Medicine and Rehabilitation

## 2012-06-26 DIAGNOSIS — M48061 Spinal stenosis, lumbar region without neurogenic claudication: Secondary | ICD-10-CM

## 2012-06-26 DIAGNOSIS — M5432 Sciatica, left side: Secondary | ICD-10-CM

## 2012-06-26 DIAGNOSIS — M545 Low back pain, unspecified: Secondary | ICD-10-CM | POA: Insufficient documentation

## 2012-06-26 DIAGNOSIS — M5126 Other intervertebral disc displacement, lumbar region: Secondary | ICD-10-CM | POA: Insufficient documentation

## 2012-06-26 DIAGNOSIS — G8929 Other chronic pain: Secondary | ICD-10-CM | POA: Insufficient documentation

## 2012-06-26 DIAGNOSIS — R52 Pain, unspecified: Secondary | ICD-10-CM | POA: Insufficient documentation

## 2012-06-27 ENCOUNTER — Ambulatory Visit (HOSPITAL_COMMUNITY): Admission: RE | Admit: 2012-06-27 | Payer: BC Managed Care – PPO | Source: Ambulatory Visit

## 2012-06-28 ENCOUNTER — Encounter: Payer: Self-pay | Admitting: Internal Medicine

## 2012-06-28 ENCOUNTER — Ambulatory Visit (INDEPENDENT_AMBULATORY_CARE_PROVIDER_SITE_OTHER): Payer: BC Managed Care – PPO | Admitting: Internal Medicine

## 2012-06-28 ENCOUNTER — Telehealth: Payer: Self-pay

## 2012-06-28 VITALS — BP 120/90 | HR 80 | Temp 98.8°F | Resp 16 | Wt 166.0 lb

## 2012-06-28 DIAGNOSIS — S0300XA Dislocation of jaw, unspecified side, initial encounter: Secondary | ICD-10-CM

## 2012-06-28 DIAGNOSIS — E538 Deficiency of other specified B group vitamins: Secondary | ICD-10-CM

## 2012-06-28 DIAGNOSIS — M545 Low back pain, unspecified: Secondary | ICD-10-CM

## 2012-06-28 DIAGNOSIS — M722 Plantar fascial fibromatosis: Secondary | ICD-10-CM

## 2012-06-28 DIAGNOSIS — M79605 Pain in left leg: Secondary | ICD-10-CM

## 2012-06-28 MED ORDER — HYDROCODONE-ACETAMINOPHEN 10-325 MG PO TABS: 1.0000 | ORAL_TABLET | Freq: Four times a day (QID) | ORAL | Status: DC | PRN

## 2012-06-28 NOTE — Assessment & Plan Note (Signed)
Continue with current prescription therapy as reflected on the Med list.  

## 2012-06-28 NOTE — Assessment & Plan Note (Signed)
MRI: *RADIOLOGY REPORT*  Clinical Data: Acute on chronic low back pain with radiculopathy  left leg. No injury. No prior surgery.  MRI LUMBAR SPINE WITHOUT CONTRAST  Technique: Multiplanar and multiecho pulse sequences of the lumbar  spine were obtained without intravenous contrast.  Comparison: None.  Findings: Last fully open disc space is labeled L5-S1. Present  examination incorporates from T10-11 disc space through the lower  sacrum.  Conus just below the T12-L1 disc space.  No worrisome bony lesion.  Visualized paravertebral structures unremarkable.  T10-11 through L3-4 unremarkable.  L4-5: Shallow broad-based disc protrusion most notable left  paracentral position (associated annular tear) with mild  indentation upon the thecal sac. Mild facet joint degenerative  changes.  L5-S1: Disc degeneration. Moderate broad-based disc osteophyte  complex. Foraminal extension greater on the left causes mild  encroachment upon but not significant compression of the exiting L5  nerve roots (more notable on the left). Minimal impression upon  the right S1 nerve root. Very mild facet joint degenerative  changes.  Mild bony overgrowth upper right sacroiliac joint.  IMPRESSION:  L4-5 shallow broad-based disc protrusion most notable left  paracentral position (associated annular tear) with mild  indentation upon the thecal sac. Mild facet joint degenerative  changes.  L5-S1 moderate broad-based disc osteophyte complex. Foraminal  extension greater on the left causes mild encroachment upon but not  significant compression of the exiting L5 nerve roots (more notable  on the left). Minimal impression upon the right S1 nerve root.  Original Report Authenticated By: Lacy Duverney, M.D.   Planning to start PT F/u w/Dr  Pamelia Hoit

## 2012-06-28 NOTE — Telephone Encounter (Signed)
Patient called regarding MRI results.  Please advise.

## 2012-06-28 NOTE — Telephone Encounter (Signed)
Left message for patient to call regarding MRI results.

## 2012-06-28 NOTE — Telephone Encounter (Signed)
MRI results noted.  Shallow protrusions at L4-5 and L-5-S1.  Annular tear at L4-5.  Will review results at next visit and go over treatment options.

## 2012-06-28 NOTE — Telephone Encounter (Signed)
Patient informed of MRI results. 

## 2012-06-28 NOTE — Progress Notes (Signed)
Subjective:    Patient ID: [name], female    DOB: 27-Jun-1976, 36 y.o.   MRN: [id number]     Back Pain This is a chronic problem. The current episode started more than 1 year ago. The problem occurs constantly. The pain is present in the lumbar spine and gluteal. The quality of the pain is described as aching and shooting. The pain radiates to the left thigh. The pain is at a severity of 6/10. The pain is moderate. The pain is worse during the day. The symptoms are aggravated by standing. Associated symptoms include numbness. Pertinent negatives include no chest pain, fever or headaches. The treatment provided moderate relief.   F/u depression. f/u B heel pain - much better   Wt Readings from Last 3 Encounters:  06/28/12 166 lb (75.297 kg)  06/05/12 169 lb (76.658 kg)  02/09/12 171 lb (77.565 kg)   BP Readings from Last 3 Encounters:  06/28/12 120/90  06/05/12 124/70  02/18/12 130/90      Past Medical History  Diagnosis Date  . LBP (low back pain) 2011  . Vitamin B12 deficiency 2012  . Vitamin D deficiency 2012  . Depression      Review of Systems  Constitutional: Negative for fever.  HENT: Negative for ear pain.   Respiratory: Negative for choking, chest tightness and stridor.   Cardiovascular: Negative for chest pain and leg swelling.  Musculoskeletal: Positive for back pain and gait problem (some L hip pain). Negative for myalgias.  Skin: Negative for wound.  Neurological: Positive for numbness. Negative for seizures, syncope and headaches.  Psychiatric/Behavioral: Negative for suicidal ideas and sleep disturbance. The patient is not nervous/anxious.        Objective:   Physical Exam  Constitutional: She appears well-developed and well-nourished. No distress.  HENT:  Head: Normocephalic.  Right Ear: External ear normal.  Left Ear: External ear normal.  Nose: Nose normal.  Mouth/Throat: Oropharynx is clear and moist.  Eyes: Conjunctivae normal are  normal. Pupils are equal, round, and reactive to light. Right eye exhibits no discharge. Left eye exhibits no discharge.  Neck: Normal range of motion. Neck supple. No JVD present. No tracheal deviation present. No thyromegaly present.  Cardiovascular: Normal rate, regular rhythm and normal heart sounds.   Pulmonary/Chest: No stridor. No respiratory distress. She has no wheezes.  Abdominal: Soft. Bowel sounds are normal. She exhibits no distension and no mass. There is no tenderness. There is no rebound and no guarding.  Musculoskeletal: She exhibits tenderness. She exhibits no edema.       LS is tender Strait leg elev is pos on L L troch major is tender to palp  Lymphadenopathy:    She has no cervical adenopathy.  Neurological: She displays normal reflexes. No cranial nerve deficit. She exhibits normal muscle tone. Coordination normal.  Skin: No rash noted. No erythema.  Psychiatric: She has a normal mood and affect. Her behavior is normal. Judgment and thought content normal.   B heels are tender to palp L LS and L SI is tender  Mouth opening  Is better   Lab Results  Component Value Date   WBC 7.9 01/11/2012   HGB 13.6 01/11/2012   HCT 42.7 01/11/2012   PLT 262.0 07/01/2011   GLUCOSE 88 01/11/2012   ALT 9 01/11/2012   AST 15 01/11/2012   NA 137 01/11/2012   K 4.5 01/11/2012   CL 103 01/11/2012   CREATININE 0.82 01/11/2012   BUN 11 01/11/2012  CO2 25 01/11/2012   TSH 2.39 07/01/2011    LS MRI    Assessment & Plan:

## 2012-06-28 NOTE — Assessment & Plan Note (Signed)
Resolved

## 2012-06-28 NOTE — Assessment & Plan Note (Signed)
2013 Dr Pamelia Hoit [id number] MRI: *RADIOLOGY REPORT*  Clinical Data: Acute on chronic low back pain with radiculopathy  left leg. No injury. No prior surgery.  MRI LUMBAR SPINE WITHOUT CONTRAST  Technique: Multiplanar and multiecho pulse sequences of the lumbar  spine were obtained without intravenous contrast.  Comparison: None.  Findings: Last fully open disc space is labeled L5-S1. Present  examination incorporates from T10-11 disc space through the lower  sacrum.  Conus just below the T12-L1 disc space.  No worrisome bony lesion.  Visualized paravertebral structures unremarkable.  T10-11 through L3-4 unremarkable.  L4-5: Shallow broad-based disc protrusion most notable left  paracentral position (associated annular tear) with mild  indentation upon the thecal sac. Mild facet joint degenerative  changes.  L5-S1: Disc degeneration. Moderate broad-based disc osteophyte  complex. Foraminal extension greater on the left causes mild  encroachment upon but not significant compression of the exiting L5  nerve roots (more notable on the left). Minimal impression upon  the right S1 nerve root. Very mild facet joint degenerative  changes.  Mild bony overgrowth upper right sacroiliac joint.  IMPRESSION:  L4-5 shallow broad-based disc protrusion most notable left  paracentral position (associated annular tear) with mild  indentation upon the thecal sac. Mild facet joint degenerative  changes.  L5-S1 moderate broad-based disc osteophyte complex. Foraminal  extension greater on the left causes mild encroachment upon but not  significant compression of the exiting L5 nerve roots (more notable  on the left). Minimal impression upon the right S1 nerve root.  Original Report Authenticated By: Lacy Duverney, M.D.

## 2012-06-28 NOTE — Assessment & Plan Note (Signed)
Some better 

## 2012-07-12 ENCOUNTER — Encounter: Payer: BC Managed Care – PPO | Admitting: Physical Medicine and Rehabilitation

## 2012-07-13 ENCOUNTER — Telehealth: Payer: Self-pay | Admitting: Internal Medicine

## 2012-07-13 NOTE — Telephone Encounter (Signed)
Patient Information:  Caller Name: Ashtyn  Phone: [phone]  Patient: [name], [name]  Gender: Female  DOB: May 01, 1976  Age: 36 Years  PCP: Plotnikov, Alex (Adults only)  Pregnant: No  Office Follow Up:  Does the office need to follow up with this patient?: Yes  Instructions For The Office: Patient triages to be seen today/tomorrow. No appt available. Scheduled tomorrow 15;45.  Patient requesting a "work in today" if possible.   Symptoms  Reason For Call & Symptoms: Onset of sneezing and runny nose on 07/03/12. Worsening coughing green brown productive. Afebrile, +swelling under eyes. +Headache behind eyes.  Treated with allegra, Sudafed and nasal spray but has not had relief.  Reviewed Health History In EMR: Yes  Reviewed Medications In EMR: Yes  Reviewed Allergies In EMR: Yes  Reviewed Surgeries / Procedures: No  Date of Onset of Symptoms: 07/03/2012  Treatments Tried: Naproxyn, sudafed 12 hour  Treatments Tried Worked: No OB / GYN:  LMP: 07/07/2012  Guideline(s) Used:  Sinus Pain and Congestion  Disposition Per Guideline:   See Today or Tomorrow in Office  Reason For Disposition Reached:   Sinus congestion (pressure, fullness) present > 10 days  Advice Given:  Hydration:  Drink plenty of liquids (6-8 glasses of water daily). If the air in your home is dry, use a cool mist humidifier  Nasal Decongestants for a Very Stuffy or Runny Nose:  Phenylephrine (Sudafed PE) is available OTC in pill form. Typical adult dosage is two 30 mg tablets every 6 hours.  Nasal Decongestants for a Very Stuffy or Runny Nose:  If you have a very stuffy nose, nasal decongestant medicines can shrink the swollen nasal mucosa and allow for easier breathing. If you have a very runny nose, these medicines can reduce the amount of drainage. They may be taken as pills by mouth or as a nasal spray.  Phenylephrine (Sudafed PE) is available OTC in pill form. Typical adult dosage is two 30 mg tablets  every 6 hours.  For a Stuffy Nose - Use Nasal Washes:  Introduction: Saline (salt water) nasal irrigation (nasal wash) is an effective and simple home remedy for treating stuffy nose and sinus congestion. The nose can be irrigated by pouring, spraying, or squirting salt water into the nose and then letting it run back out.  How it Helps: The salt water rinses out excess mucus, washes out any irritants (dust, allergens) that might be present, and moistens the nasal cavity.  For a Runny Nose With Profuse Discharge:  Nasal mucus and discharge helps to wash viruses and bacteria out of the nose and sinuses.  Blowing the nose is all that is needed.  If the skin around your nostrils gets irritated, apply a tiny amount of petroleum ointment to the nasal openings once or twice a day.  Reassurance:   Sinus congestion is a normal part of a cold.  Here is some care advice that should help.  Appointment Scheduled:  07/14/2012 15:45:00 Appointment Scheduled Provider:  Sanda Linger (Adults only)

## 2012-07-14 ENCOUNTER — Ambulatory Visit: Payer: Self-pay | Admitting: Internal Medicine

## 2012-07-14 DIAGNOSIS — Z0289 Encounter for other administrative examinations: Secondary | ICD-10-CM

## 2012-08-03 ENCOUNTER — Telehealth: Payer: Self-pay | Admitting: *Deleted

## 2012-08-03 MED ORDER — TRAMADOL HCL 50 MG PO TABS: 50.0000 mg | ORAL_TABLET | Freq: Two times a day (BID) | ORAL | Status: DC | PRN

## 2012-08-03 MED ORDER — NABUMETONE 500 MG PO TABS: 500.0000 mg | ORAL_TABLET | Freq: Two times a day (BID) | ORAL | Status: DC

## 2012-08-03 NOTE — Telephone Encounter (Signed)
Left message for pt to callback office.  

## 2012-08-03 NOTE — Telephone Encounter (Signed)
Pt is requesting a non-narcotic pain medication for her back pain. She states that Naproxen and Tylenol are not working and is willing to try Tramadol.

## 2012-08-03 NOTE — Telephone Encounter (Signed)
OK Tramadol (Tramadol is related to codeine) We can try Relafen in place of Naproxen Thx

## 2012-08-04 NOTE — Telephone Encounter (Signed)
Left message for pt to callback office.  

## 2012-08-07 NOTE — Telephone Encounter (Signed)
Patient left message on triage returning Ashleys call from last week, she can be reached today at [phone]

## 2012-08-08 NOTE — Telephone Encounter (Signed)
Left message for pt to callback office.  

## 2012-08-08 NOTE — Telephone Encounter (Signed)
Pt informed of MD's advisement. Pt states that she had an interaction with the Tramadol and her SSRI.

## 2012-08-11 ENCOUNTER — Encounter
Payer: BC Managed Care – PPO | Attending: Physical Medicine and Rehabilitation | Admitting: Physical Medicine and Rehabilitation

## 2012-08-11 ENCOUNTER — Encounter: Payer: Self-pay | Admitting: Physical Medicine and Rehabilitation

## 2012-08-11 VITALS — BP 122/78 | HR 83 | Resp 14 | Ht 66.0 in | Wt 165.0 lb

## 2012-08-11 DIAGNOSIS — M545 Low back pain, unspecified: Secondary | ICD-10-CM

## 2012-08-11 NOTE — Patient Instructions (Addendum)
I have reviewed your Lumbar MRI with you today.  I understand that you're left leg symptoms (numbness and tingling) are not your main pain complaint.  We have discussed various causes of low back pain using a spine model.  I have discussed the importance of using good body mechanics during activities, pacing yourself as well as posture.  I understand you're unable to participate in physical therapy last month.  We have discussed some specific goals regarding body mechanics and strengthening/endurance that a therapist may be able to help you with. I will give  the therapist specific orders to work on these issues for you. We will try to minimize the number of visits per your request.  I understand tramadol 50 mg caused some headache the next day. You've mentioned that you're able to take ultracet (37.5 mg tramadol) in the past with your lexapro.  Consider breaking your tramadol and half and taking a 325 Tylenol with it up to 3 times a day. I have offered a prescription for Ultracet but understand you would like to use the medication currently have.  I have reviewed some other treatment options and have given you a brochure on medial branch blocks. These injections may help with low back pain.  Continue using your relefen as Dr. Posey Rea prescribed recently.  I will see you back in one month   Back Pain, Adult Low back pain is very common. About 1 in 5 people have back pain.The cause of low back pain is rarely dangerous. The pain often gets better over time.About half of people with a sudden onset of back pain feel better in just 2 weeks. About 8 in 10 people feel better by 6 weeks.  CAUSES Some common causes of back pain include:  Strain of the muscles or ligaments supporting the spine.  Wear and tear (degeneration) of the spinal discs.  Arthritis.  Direct injury to the back. DIAGNOSIS Most of the time, the direct cause of low back pain is not known.However, back pain can be  treated effectively even when the exact cause of the pain is unknown.Answering your caregiver's questions about your overall health and symptoms is one of the most accurate ways to make sure the cause of your pain is not dangerous. If your caregiver needs more information, he or she may order lab work or imaging tests (X-rays or MRIs).However, even if imaging tests show changes in your back, this usually does not require surgery. HOME CARE INSTRUCTIONS For many people, back pain returns.Since low back pain is rarely dangerous, it is often a condition that people can learn to [facility] their own.   Remain active. It is stressful on the back to sit or stand in one place. Do not sit, drive, or stand in one place for more than 30 minutes at a time. Take short walks on level surfaces as soon as pain allows.Try to increase the length of time you walk each day.  Do not stay in bed.Resting more than 1 or 2 days can delay your recovery.  Do not avoid exercise or work.Your body is made to move.It is not dangerous to be active, even though your back may hurt.Your back will likely heal faster if you return to being active before your pain is gone.  Pay attention to your body when you bend and lift. Many people have less discomfortwhen lifting if they bend their knees, keep the load close to their bodies,and avoid twisting. Often, the most comfortable positions are those that put  less stress on your recovering back.  Find a comfortable position to sleep. Use a firm mattress and lie on your side with your knees slightly bent. If you lie on your back, put a pillow under your knees.  Only take over-the-counter or prescription medicines as directed by your caregiver. Over-the-counter medicines to reduce pain and inflammation are often the most helpful.Your caregiver may prescribe muscle relaxant drugs.These medicines help dull your pain so you can more quickly return to your normal activities and healthy  exercise.  Put ice on the injured area.  Put ice in a plastic bag.  Place a towel between your skin and the bag.  Leave the ice on for 15 to 20 minutes, 3 to 4 times a day for the first 2 to 3 days. After that, ice and heat may be alternated to reduce pain and spasms.  Ask your caregiver about trying back exercises and gentle massage. This may be of some benefit.  Avoid feeling anxious or stressed.Stress increases muscle tension and can worsen back pain.It is important to recognize when you are anxious or stressed and learn ways to manage it.Exercise is a great option. SEEK MEDICAL CARE IF:  You have pain that is not relieved with rest or medicine.  You have pain that does not improve in 1 week.  You have new symptoms.  You are generally not feeling well. SEEK IMMEDIATE MEDICAL CARE IF:   You have pain that radiates from your back into your legs.  You develop new bowel or bladder control problems.  You have unusual weakness or numbness in your arms or legs.  You develop nausea or vomiting.  You develop abdominal pain.  You feel faint. Document Released: 07/12/2005 Document Revised: 01/11/2012 Document Reviewed: 11/30/2010 [facility] Patient Information 2013 [place], [place].

## 2012-08-11 NOTE — Progress Notes (Signed)
Subjective:    Patient ID: [name], female    DOB: 07-Oct-1975, 37 y.o.   MRN: [id number]  HPI  The patient is a 37 year old woman who is once again accompanied by an 36 month old baby who she is babysitting for. She was last seen in November at which time her chief complaint is low back pain as well as left leg and foot pain. She was treated with Medrol dose pack and reports overall improvement. Today she states her leg pain is no longer a problem although she does occasionally get some numbness and tingling it is not painful. Her chief problem today is low back pain which is worse with twisting movements and transitional movements such as going from sit to stand or bending.  Her average pain today is between 5 and 6. 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  She was placed on tramadol 50 mg recently and had some difficulty with headaches the next day. She also recently started taking relefen within the last few days.  Although physical therapy was ordered last visit she did not attend stating it was cost prohibitive  She did follow through with an MRI in the results are reviewed with her today.    Pain Inventory Average Pain 6 Pain Right Now 5 My pain is dull, stabbing and tingling  In the last 24 hours, has pain interfered with the following? General activity 6 Relation with others 7 Enjoyment of life 7 What TIME of day is your pain at its worst? day and evening Sleep (in general) Fair  Pain is worse with: sitting and standing Pain improves with: rest and medication Relief from Meds: 7  Mobility how many minutes can you walk? 30 ability to climb steps?  yes Do you have any goals in this area?  yes  Function employed # of hrs/week 70 hrs 2 jobs  Neuro/Psych weakness trouble walking spasms  Prior Studies CT/MRI  Physicians involved in your care Dr Posey Rea   Family History  Problem Relation Age of Onset  . Kidney disease Other     History   Social History  . Marital Status: Married    Spouse Name: N/A    Number of Children: N/A  . Years of Education: N/A   Social History Main Topics  . Smoking status: Current Some Day Smoker -- 0.3 packs/day for 10 years    Types: Cigarettes  . Smokeless tobacco: Never Used  . Alcohol Use: No  . Drug Use: No  . Sexually Active: Yes   Other Topics Concern  . None   Social History Narrative  . None   Past Surgical History  Procedure Date  . Cesarean section 1-08   Past Medical History  Diagnosis Date  . LBP (low back pain) 2011  . Vitamin B12 deficiency 2012  . Vitamin D deficiency 2012  . Depression    BP 122/78  Pulse 83  Resp 14  Ht 5\' 6"  (1.676 m)  Wt 165 lb (74.844 kg)  BMI 26.63 kg/m2  SpO2 99%  LMP 07/26/2012     Review of Systems  Musculoskeletal: Positive for back pain.  Neurological: Positive for weakness and numbness.  All other systems reviewed and are negative.       Objective:   Physical Exam  She is a mildly obese woman who does not appear in any distress She is oriented x3. Her speech is clear her affect is bright she's alert cooperative and pleasant.  She follows commands without difficulty answers questions appropriately.   Cranial nerves are grossly intact   Coordination is intact   Sensation is diminished in medial aspect of left great toe   Manual muscle testing reveals  intact bilateral lower extremity strength. Hip flexors, hip abductor's and abductor's, knee flexors and extensors, and dorsiflexors and plantar flexors as well as the everters and EHL were all tested.   Straight leg raise is negative on left and right   Reflexes are 2+ the patellar tendons and 2+ in the Achilles tendon   No abnormal tone clonus or tremors are appreciated   Transitions without difficulty from sit to stand   Gait overall improved.  Tandem gait Romberg test performed adequately  Able to walk on heels and  toes   Lumbar range of motion is diminished in flexion as well as extension pain at end range.               Assessment & Plan:   1. Lumbago some radiation to left lower extremity (but without significant extremity pain) MRI showed disc involvement at L4-5 and L5-S1. Annual her tear at L4-5, mild facet involvement.  Plan:  I have spent today reviewing her MRI with her as well as going over spine models.  At this time she is interested in pursuing physical therapy with an emphasis on education of proper body mechanics and posture lifting techniques ergonomics. Will have them review pain management techniques as well move toward core strengthening.  We have also discussed other options to help manage her low back pain. She had amphetamine noted in her urine drug screen. She understands we will not be prescribing Vicodin or oxycodone. She had some headache the day after taking a 50 mg tramadol recently but tells me she has had Ultracet (37.5 mg tramadol) in the past and was able to tolerate it with her antidepressant.  I have offered to prescribe Ultracet but she would rather break her tramadol and half and take Tylenol 325 with it.  I have given her a brochure on medial branch blocks and we have discussed this treatment option as well.  I have answered her questions and she seems comfortable with the current plan.  I will see her back in 4-6 weeks    06/05/2012 urine drug screen positive for amphetamine and alcohol. ORT 6

## 2012-08-15 ENCOUNTER — Telehealth: Payer: Self-pay | Admitting: Internal Medicine

## 2012-08-15 NOTE — Telephone Encounter (Signed)
Patient Information:  Caller Name: Stephanine  Phone: [phone]  Patient: [name], [name]  Gender: Female  DOB: [birth year]  Age: 37 Years  PCP: Plotnikov, Alex (Adults only)  Pregnant: No  Office Follow Up:  Does the office need to follow up with this patient?: No  Instructions For The Office: N/A  RN Note:  No face swelling but mild discomfort when touches jaw bone. Unable to open mouth all the way. Painful to chew. Feels "round ball" next to Left tonsil.  Has 1000 dental appointment. Declined to schedule now due to busy with job; stated will call back for appointment later 08/15/12.  Symptoms  Reason For Call & Symptoms: Worsening left jaw pain; painful to chew.  Reviewed Health History In EMR: Yes  Reviewed Medications In EMR: Yes  Reviewed Allergies In EMR: Yes  Reviewed Surgeries / Procedures: Yes  Date of Onset of Symptoms: 08/12/2012 OB / GYN:  LMP: 07/26/2012  Guideline(s) Used:  Face Pain  Disposition Per Guideline:   See Today in Office  Reason For Disposition Reached:   Face pain present > 24 hours  Advice Given:  Reassurance:  Pain in the face can be caused by many things, including your sinuses, your teeth, and your jaw joint (TMJ).  If this pain does not go away, you will need to be examined.  Pain Medicines:  For pain relief, you can take either acetaminophen, ibuprofen, or naproxen.  Call Back If:  Any facial rash or swelling  Pain is severe and not relieved by pain medications  You become worse.  RN Overrode Recommendation:  Follow Up With Office Later  States will call back for appointment.

## 2012-08-15 NOTE — Telephone Encounter (Signed)
Noted. OV Thx

## 2012-08-17 ENCOUNTER — Other Ambulatory Visit: Payer: Self-pay | Admitting: Internal Medicine

## 2012-08-21 ENCOUNTER — Telehealth: Payer: Self-pay | Admitting: *Deleted

## 2012-08-21 NOTE — Telephone Encounter (Signed)
PATIENT CALLED AGAIN TO REQUEST PAIN MEDICATION BE CALLED IN.

## 2012-08-21 NOTE — Telephone Encounter (Signed)
OV Use ice/heat, massage Thx

## 2012-08-22 NOTE — Telephone Encounter (Signed)
Patient notified of need to make office visit and may use ice or heat or massage to pain area. Per Dr. Posey Rea.

## 2012-08-23 ENCOUNTER — Telehealth: Payer: Self-pay | Admitting: Internal Medicine

## 2012-08-23 NOTE — Telephone Encounter (Signed)
Patient Information:  Caller Name: Offie  Phone: [phone]  Patient: [name], [name]  Gender: Female  DOB: [birth year]  Age: 37 Years  PCP: Plotnikov, Alex (Adults only)  Pregnant: No  Office Follow Up:  Does the office need to follow up with this patient?: Yes  Instructions For The Office: needs to be seen but cannot afford an OV until 2/8  RN Note:  Declines an appt. due to financial issues.   She is going to stop the Relafen.  Will monitor the bleeding and if it worsens she will go straight to the ED.  Symptoms  Reason For Call & Symptoms: Patient calling to ask if NSAIDS will cause a heavier menstrual cycle?  No pain or cramping.  Is on Nabumetone (Relafen 1 po bid since 1/9) for her back.  Reviewed Health History In EMR: Yes  Reviewed Medications In EMR: Yes  Reviewed Allergies In EMR: Yes  Reviewed Surgeries / Procedures: Yes  Date of Onset of Symptoms: 08/23/2012 OB / GYN:  LMP: 08/20/2012  Guideline(s) Used:  Vaginal Bleeding - Abnormal  Disposition Per Guideline:   See Today in Office  Reason For Disposition Reached:   Skin bruises or nosebleed and not caused by an injury  Advice Given:  Call Back If:  Bleeding becomes worse  Call Back If:  Bleeding becomes worse  You become worse.  Patient Refused Recommendation:  Patient Refused Care Advice  financial issues

## 2012-08-24 NOTE — Telephone Encounter (Signed)
Usually Relafen is helpful in reduction of menstrual flow and cramps. OK to hold Relafen however Take Tylenol prn Thx

## 2012-08-24 NOTE — Telephone Encounter (Signed)
Left message for pt to callback office.  

## 2012-08-28 NOTE — Telephone Encounter (Signed)
Pt informed of MD's advisement via VM and to callback office with any questions/concerns.  

## 2012-09-01 ENCOUNTER — Ambulatory Visit: Payer: BC Managed Care – PPO | Admitting: Internal Medicine

## 2012-09-04 ENCOUNTER — Encounter: Payer: Self-pay | Admitting: Internal Medicine

## 2012-09-04 ENCOUNTER — Ambulatory Visit (INDEPENDENT_AMBULATORY_CARE_PROVIDER_SITE_OTHER): Payer: BC Managed Care – PPO | Admitting: Internal Medicine

## 2012-09-04 VITALS — BP 130/84 | Temp 97.5°F | Wt 169.0 lb

## 2012-09-04 DIAGNOSIS — M545 Low back pain, unspecified: Secondary | ICD-10-CM

## 2012-09-04 DIAGNOSIS — M79605 Pain in left leg: Secondary | ICD-10-CM

## 2012-09-04 DIAGNOSIS — E538 Deficiency of other specified B group vitamins: Secondary | ICD-10-CM

## 2012-09-04 DIAGNOSIS — E559 Vitamin D deficiency, unspecified: Secondary | ICD-10-CM

## 2012-09-04 NOTE — Assessment & Plan Note (Signed)
Continue with current prescription therapy as reflected on the Med list.  

## 2012-09-04 NOTE — Assessment & Plan Note (Addendum)
She failed a drug test w/Dr Benjamine Mola - amphet (+) - Per pt; she took Vyvance in error in place of Gabapentin 11/13 She can't afford the therapist. She would like to have a low dose of Hydrocodone untill she can afford a therapist Repeat drug test today - she knows: no ETOH on Rx

## 2012-09-04 NOTE — Progress Notes (Signed)
Subjective:      Back Pain This is a chronic problem. The current episode started more than 1 year ago. The problem occurs constantly. The pain is present in the lumbar spine and gluteal. The quality of the pain is described as aching and shooting. The pain radiates to the left thigh. The pain is at a severity of 6/10. The pain is moderate. The pain is worse during the day. The symptoms are aggravated by standing. Associated symptoms include numbness. Pertinent negatives include no chest pain, fever or headaches. The treatment provided moderate relief.  pain is is at 5/10 now  F/u depression. F/u B heel pain - much better.  She failed a drug test w/Dr Benjamine Mola - ETOH and amphet (+) - Per pt; she took Vyvance in error in place of Gabapentin 11/13. PT was advised - too $$$   Wt Readings from Last 3 Encounters:  09/04/12 169 lb (76.658 kg)  08/11/12 165 lb (74.844 kg)  06/28/12 166 lb (75.297 kg)   BP Readings from Last 3 Encounters:  09/04/12 130/84  08/11/12 122/78  06/28/12 120/90      Past Medical History  Diagnosis Date  . LBP (low back pain) 2011  . Vitamin B12 deficiency 2012  . Vitamin D deficiency 2012  . Depression      Review of Systems  Constitutional: Negative for fever.  HENT: Negative for ear pain.   Respiratory: Negative for choking, chest tightness and stridor.   Cardiovascular: Negative for chest pain and leg swelling.  Musculoskeletal: Positive for back pain and gait problem (some L hip pain). Negative for myalgias.  Skin: Negative for wound.  Neurological: Positive for numbness. Negative for seizures, syncope and headaches.  Psychiatric/Behavioral: Negative for suicidal ideas and sleep disturbance. The patient is not nervous/anxious.        Objective:   Physical Exam  Constitutional: She appears well-developed and well-nourished. No distress.  HENT:  Head: Normocephalic.  Right Ear: External ear normal.  Left Ear: External ear normal.   Nose: Nose normal.  Mouth/Throat: Oropharynx is clear and moist.  Eyes: Conjunctivae are normal. Pupils are equal, round, and reactive to light. Right eye exhibits no discharge. Left eye exhibits no discharge.  Neck: Normal range of motion. Neck supple. No JVD present. No tracheal deviation present. No thyromegaly present.  Cardiovascular: Normal rate, regular rhythm and normal heart sounds.   Pulmonary/Chest: No stridor. No respiratory distress. She has no wheezes.  Abdominal: Soft. Bowel sounds are normal. She exhibits no distension and no mass. There is no tenderness. There is no rebound and no guarding.  Musculoskeletal: She exhibits tenderness. She exhibits no edema.  LS is tender Strait leg elev is pos on L L troch major is tender to palp  Lymphadenopathy:    She has no cervical adenopathy.  Neurological: She displays normal reflexes. No cranial nerve deficit. She exhibits normal muscle tone. Coordination normal.  Skin: No rash noted. No erythema.  Psychiatric: She has a normal mood and affect. Her behavior is normal. Judgment and thought content normal.   B heels are tender to palp L LS and L SI is tender  Mouth opening  Is better   Lab Results  Component Value Date   WBC 7.9 01/11/2012   HGB 13.6 01/11/2012   HCT 42.7 01/11/2012   PLT 262.0 07/01/2011   GLUCOSE 88 01/11/2012   ALT 9 01/11/2012   AST 15 01/11/2012   NA 137 01/11/2012   K 4.5 01/11/2012  CL 103 01/11/2012   CREATININE 0.82 01/11/2012   BUN 11 01/11/2012   CO2 25 01/11/2012   TSH 2.39 07/01/2011    LS MRI    Assessment & Plan:

## 2012-09-05 NOTE — Assessment & Plan Note (Signed)
2013 Dr Pamelia Hoit 11/13 She failed a drug test w/Dr Benjamine Mola - ETOH and amphet (+) - Per pt; she took Vyvance in error in place of Gabapentin 11/13. PT was advised - too $$$  Repeat drug test Consider pain Rx - low dose  W/periodic drug tests

## 2012-09-06 ENCOUNTER — Other Ambulatory Visit: Payer: Self-pay | Admitting: *Deleted

## 2012-09-06 ENCOUNTER — Other Ambulatory Visit: Payer: BC Managed Care – PPO

## 2012-09-06 DIAGNOSIS — M79605 Pain in left leg: Secondary | ICD-10-CM

## 2012-09-06 DIAGNOSIS — M545 Low back pain: Secondary | ICD-10-CM

## 2012-09-08 ENCOUNTER — Encounter: Payer: BC Managed Care – PPO | Admitting: Physical Medicine and Rehabilitation

## 2012-09-08 LAB — DRUG SCREEN, URINE
Amphetamine Screen, Ur: NEGATIVE
Benzodiazepines.: NEGATIVE
Cocaine Metabolites: NEGATIVE
Methadone: NEGATIVE

## 2012-09-11 ENCOUNTER — Other Ambulatory Visit: Payer: Self-pay

## 2012-09-11 DIAGNOSIS — M79605 Pain in left leg: Secondary | ICD-10-CM

## 2012-09-11 DIAGNOSIS — M545 Low back pain: Secondary | ICD-10-CM

## 2012-09-11 NOTE — Telephone Encounter (Signed)
Patient called [facility] requesting status of U/A results, also wants to know if MD sent RX. Thanks

## 2012-09-11 NOTE — Telephone Encounter (Signed)
[name], please, inform patient that her drug screen was neg. Call in Vicodin pls RTC in 2 mo w/a drug screen Thx

## 2012-09-12 ENCOUNTER — Ambulatory Visit: Payer: BC Managed Care – PPO | Admitting: Physical Medicine and Rehabilitation

## 2012-09-12 MED ORDER — HYDROCODONE-ACETAMINOPHEN 5-325 MG PO TABS: 1.0000 | ORAL_TABLET | Freq: Two times a day (BID) | ORAL | Status: DC | PRN

## 2012-09-12 NOTE — Telephone Encounter (Signed)
Rf phoned in. Pt informed of below.

## 2012-09-14 ENCOUNTER — Ambulatory Visit: Payer: BC Managed Care – PPO | Admitting: Physical Medicine and Rehabilitation

## 2012-09-20 ENCOUNTER — Telehealth: Payer: Self-pay | Admitting: Physical Medicine and Rehabilitation

## 2012-09-20 NOTE — Telephone Encounter (Signed)
Patient Information:  Caller Name: [name]  Phone: [phone]  Patient: [name], [name]  Gender: Female  DOB: 08/13/1975  Age: 37 Years  PCP: Plotnikov, Alex (Adults only)  Pregnant: No  Office Follow Up:  Does the office need to follow up with this patient?: Yes  Instructions For The Office: Please follow up regarding adding to or increasing the frequency or strength of pain management medication.  RN Note:  Patient is a chronic pain suffer for past 1-2 year.  Have tired different modalities, but with high medical deductibles pain management and physical therapy is cost prohibitive until deductible is met.  Took self off pain medications for about 1.5 months to see its affected.  Was seen by Dr. Hoyle Barr on 02/10 to resume pain management. Was placed on Norco 5-325.  Has been on it about 2 weeks with it not controlling the pain.  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  At rest it is 5/10 on pain scale.  Patient is familiar with care advice.  Has tried to stretch the pain relief by using Ketoprofen she had left over or other NSAIDS.  She calls today to request something that may adjunct what she is currently taking, if she can increase to TID or increase its strength. This is a chronic problem for patient and has been recently evaluated  Symptoms  Reason For Call & Symptoms: Back Pain with medication not working.  Reviewed Health History In EMR: Yes  Reviewed Medications In EMR: Yes  Reviewed Allergies In EMR: Yes  Reviewed Surgeries / Procedures: Yes  Date of Onset of Symptoms: 09/20/2012  Treatments Tried: NSAID  Treatments Tried Worked: No OB / GYN:  LMP: 09/20/2012  Guideline(s) Used:  Back Pain  Disposition Per Guideline:   See Within 2 Weeks in Office  Reason For Disposition Reached:   Back pain is a chronic symptom (recurrent or ongoing AND lasting > 4 weeks)  Advice Given:  Call Back If:  Numbness  or weakness occur  Bowel/bladder problems occur  You become worse.  Patient Refused Recommendation:  Patient Will Follow Up With Office Later  Patient recently evaluated

## 2012-09-20 NOTE — Telephone Encounter (Signed)
Ok to add Gabapentin tid prn Thx

## 2012-09-21 MED ORDER — GABAPENTIN 100 MG PO CAPS: 100.0000 mg | ORAL_CAPSULE | Freq: Three times a day (TID) | ORAL | Status: DC

## 2012-09-21 NOTE — Telephone Encounter (Signed)
Pt informed of MD's advisement. Pt states that she has some of the medication already.

## 2012-10-23 ENCOUNTER — Other Ambulatory Visit: Payer: Self-pay | Admitting: Internal Medicine

## 2012-10-31 ENCOUNTER — Telehealth: Payer: Self-pay | Admitting: Internal Medicine

## 2012-10-31 NOTE — Telephone Encounter (Signed)
Pt req refill for Tramadol 50mg . Pt stated she is going on a trip and pt feel that this would help her back. Pt stated that this med was taken off at the beginning of this year due to the side effect(stomach pain) she had, but pt stated that she is able to take it now. Please advise.

## 2012-11-01 ENCOUNTER — Ambulatory Visit: Payer: BC Managed Care – PPO | Admitting: Internal Medicine

## 2012-11-02 NOTE — Telephone Encounter (Signed)
Pt called to follow up on her med req. Please advise

## 2012-11-03 ENCOUNTER — Telehealth: Payer: Self-pay | Admitting: *Deleted

## 2012-11-03 MED ORDER — TRAMADOL HCL 50 MG PO TABS: 50.0000 mg | ORAL_TABLET | Freq: Two times a day (BID) | ORAL | Status: DC | PRN

## 2012-11-03 NOTE — Telephone Encounter (Signed)
Pt called left msg on triage stating been trying to get refill on her tramadol prescription.Marland KitchenRaechel Chute

## 2012-11-03 NOTE — Telephone Encounter (Signed)
Noted Will fill Thx

## 2012-11-03 NOTE — Telephone Encounter (Signed)
She said she was dizzy and sweaty from Tramadol before. It is marked as "allergy" Thx

## 2012-11-06 NOTE — Telephone Encounter (Signed)
MD sent refill see previous phone note also updated allergy list...lmb

## 2012-11-08 ENCOUNTER — Ambulatory Visit: Payer: BC Managed Care – PPO | Admitting: Internal Medicine

## 2012-11-08 DIAGNOSIS — Z0289 Encounter for other administrative examinations: Secondary | ICD-10-CM

## 2012-11-15 ENCOUNTER — Telehealth: Payer: Self-pay | Admitting: *Deleted

## 2012-11-15 ENCOUNTER — Encounter: Payer: Self-pay | Admitting: Internal Medicine

## 2012-11-15 ENCOUNTER — Ambulatory Visit (INDEPENDENT_AMBULATORY_CARE_PROVIDER_SITE_OTHER): Payer: BC Managed Care – PPO | Admitting: Internal Medicine

## 2012-11-15 VITALS — BP 110/72 | HR 80 | Temp 98.3°F | Resp 16 | Wt 169.0 lb

## 2012-11-15 DIAGNOSIS — E538 Deficiency of other specified B group vitamins: Secondary | ICD-10-CM

## 2012-11-15 DIAGNOSIS — M79605 Pain in left leg: Secondary | ICD-10-CM

## 2012-11-15 DIAGNOSIS — M545 Low back pain, unspecified: Secondary | ICD-10-CM

## 2012-11-15 MED ORDER — TRAMADOL HCL 50 MG PO TABS: 50.0000 mg | ORAL_TABLET | Freq: Two times a day (BID) | ORAL | Status: DC | PRN

## 2012-11-15 NOTE — Telephone Encounter (Signed)
Pt would like to change/increase dose of tramadol to 1-2 q 6-8 hours due to her menstrual cramps/back pain. Please advise.

## 2012-11-15 NOTE — Assessment & Plan Note (Signed)
Continue with current prescription therapy as reflected on the Med list.  

## 2012-11-15 NOTE — Progress Notes (Signed)
Patient ID: [name], female   DOB: 1976/04/27, 37 y.o.   MRN: [id number]   Subjective:      Back Pain This is a chronic problem. The current episode started more than 1 year ago. The problem occurs constantly. The problem has been gradually improving since onset. The pain is present in the lumbar spine and gluteal. The quality of the pain is described as aching and shooting. The pain radiates to the left thigh. The pain is at a severity of 4/10. The pain is moderate. The pain is worse during the day. The symptoms are aggravated by standing. Associated symptoms include numbness. Pertinent negatives include no chest pain, fever or headaches. Risk factors include obesity. The treatment provided moderate relief.  LBP is worse w/her periods  F/u depression. F/u B heel pain - much better.  [name] failed a drug test w/Dr Benjamine Mola - ETOH and amphet (+) - Per pt; [name] took Vyvance in error in place of Gabapentin 11/13. PT was advised - too $$$   Wt Readings from Last 3 Encounters:  11/15/12 169 lb (76.658 kg)  09/04/12 169 lb (76.658 kg)  08/11/12 165 lb (74.844 kg)   BP Readings from Last 3 Encounters:  11/15/12 110/72  09/04/12 130/84  08/11/12 122/78      Past Medical History  Diagnosis Date  . LBP (low back pain) 2011  . Vitamin B12 deficiency 2012  . Vitamin D deficiency 2012  . Depression      Review of Systems  Constitutional: Negative for fever.  HENT: Negative for ear pain.   Respiratory: Negative for choking, chest tightness and stridor.   Cardiovascular: Negative for chest pain and leg swelling.  Musculoskeletal: Positive for back pain and gait problem (some L hip pain). Negative for myalgias.  Skin: Negative for wound.  Neurological: Positive for numbness. Negative for seizures, syncope and headaches.  Psychiatric/Behavioral: Negative for suicidal ideas and sleep disturbance. The patient is not nervous/anxious.        Objective:   Physical Exam   Constitutional: [name] appears well-developed and well-nourished. No distress.  HENT:  Head: Normocephalic.  Right Ear: External ear normal.  Left Ear: External ear normal.  Nose: Nose normal.  Mouth/Throat: Oropharynx is clear and moist.  Eyes: Conjunctivae are normal. Pupils are equal, round, and reactive to light. Right eye exhibits no discharge. Left eye exhibits no discharge.  Neck: Normal range of motion. Neck supple. No JVD present. No tracheal deviation present. No thyromegaly present.  Cardiovascular: Normal rate, regular rhythm and normal heart sounds.   Pulmonary/Chest: No stridor. No respiratory distress. [name] has no wheezes.  Abdominal: Soft. Bowel sounds are normal. [name] exhibits no distension and no mass. There is no tenderness. There is no rebound and no guarding.  Musculoskeletal: [name] exhibits tenderness. [name] exhibits no edema.  LS is tender Strait leg elev is pos on L L troch major is tender to palp  Lymphadenopathy:    [name] has no cervical adenopathy.  Neurological: [name] displays normal reflexes. No cranial nerve deficit. [name] exhibits normal muscle tone. Coordination normal.  Skin: No rash noted. No erythema.  Psychiatric: [name] has a normal mood and affect. Her behavior is normal. Judgment and thought content normal.   B heels are tender to palp L LS and L SI is tender  Mouth opening  Is better   Lab Results  Component Value Date   WBC 7.9 01/11/2012   HGB 13.6 01/11/2012   HCT 42.7 01/11/2012   PLT 262.0  07/01/2011   GLUCOSE 88 01/11/2012   ALT 9 01/11/2012   AST 15 01/11/2012   NA 137 01/11/2012   K 4.5 01/11/2012   CL 103 01/11/2012   CREATININE 0.82 01/11/2012   BUN 11 01/11/2012   CO2 25 01/11/2012   TSH 2.39 07/01/2011    LS MRI    Assessment & Plan:

## 2012-11-15 NOTE — Telephone Encounter (Signed)
Pls follow previous prescription instruction Thx

## 2012-11-16 NOTE — Telephone Encounter (Signed)
Left detailed mess informing pt of below.  

## 2012-11-17 ENCOUNTER — Ambulatory Visit (INDEPENDENT_AMBULATORY_CARE_PROVIDER_SITE_OTHER): Payer: BC Managed Care – PPO | Admitting: Physician Assistant

## 2012-11-17 VITALS — BP 131/81 | HR 86 | Temp 98.5°F | Resp 18 | Ht 66.0 in | Wt 168.1 lb

## 2012-11-17 DIAGNOSIS — R059 Cough, unspecified: Secondary | ICD-10-CM

## 2012-11-17 DIAGNOSIS — J029 Acute pharyngitis, unspecified: Secondary | ICD-10-CM

## 2012-11-17 DIAGNOSIS — B09 Unspecified viral infection characterized by skin and mucous membrane lesions: Secondary | ICD-10-CM

## 2012-11-17 DIAGNOSIS — R05 Cough: Secondary | ICD-10-CM

## 2012-11-17 LAB — POCT CBC
Granulocyte percent: 67.6 %G (ref 37–80)
HCT, POC: 39.2 % (ref 37.7–47.9)
Hemoglobin: 12.3 g/dL (ref 12.2–16.2)
MPV: 8.6 fL (ref 0–99.8)
POC Granulocyte: 4.7 (ref 2–6.9)
RBC: 4.17 M/uL (ref 4.04–5.48)

## 2012-11-17 LAB — POCT RAPID STREP A (OFFICE): Rapid Strep A Screen: NEGATIVE

## 2012-11-17 MED ORDER — HYDROCODONE-HOMATROPINE 5-1.5 MG/5ML PO SYRP: ORAL_SOLUTION | ORAL | Status: DC

## 2012-11-17 NOTE — Progress Notes (Signed)
Patient ID: [name] MRN: [id number], DOB: Feb 11, 1976, 36 y.o. Date of Encounter: 11/17/2012, 6:04 PM  Primary Physician: Sonda Primes, MD  Chief Complaint:  Chief Complaint  Patient presents with  . Rash    spreads all over body noticed yesterday morning  . Generalized Body Aches  . Sore Throat    HPI: 37 y.o. female presents with a one day history of sore throat, rash, fever, chills, myalgias, congestion, and cough. Uncertain T max. While patient was taking a shower the previous day she noticed a pinpoint erythematous rash along her abdomen. Since 11/16/12, this pinpoint rash has spread to her back, neck, and extremities. No pain or pruritis along the rash. No new soaps, medications, or clothing. Bilateral otalgia. Normal hearing. No GI complaints. Able to swallow saliva, but hurts to do so. Decreased appetite secondary to sore throat. Pushing water. No known sick contacts. No known tick bites. She did recently restart her neurontin this past week at one tab nightly.   Past Medical History  Diagnosis Date  . LBP (low back pain) 2011  . Vitamin B12 deficiency 2012  . Vitamin D deficiency 2012  . Depression      Home Meds: Prior to Admission medications   Medication Sig Start Date End Date Taking? Authorizing Provider  ALPRAZolam (XANAX) 0.25 MG tablet Take 0.25 mg by mouth at bedtime as needed.  06/02/12  Yes Historical Provider, MD  escitalopram (LEXAPRO) 10 MG tablet Take 10 mg by mouth 2 (two) times daily.    Yes Historical Provider, MD  gabapentin (NEURONTIN) 100 MG capsule Take 1 capsule (100 mg total) by mouth 3 (three) times daily. 09/20/12  Yes Georgina Quint Plotnikov, MD  nabumetone (RELAFEN) 500 MG tablet Take 1 tablet (500 mg total) by mouth 2 (two) times daily. 08/03/12  Yes Georgina Quint Plotnikov, MD  traMADol (ULTRAM) 50 MG tablet Take 1-2 tablets (50-100 mg total) by mouth 2 (two) times daily as needed for pain. 11/15/12  Yes Georgina Quint Plotnikov, MD  cyclobenzaprine  (FLEXERIL) 5 MG tablet Take 5-10 mg by mouth at bedtime as needed. 11/29/11 09/04/17 No Tresa Garter, MD    Allergies: No Known Allergies  History   Social History  . Marital Status: Married    Spouse Name: N/A    Number of Children: N/A  . Years of Education: N/A   Occupational History  . Not on file.   Social History Main Topics  . Smoking status: Current Some Day Smoker -- 0.30 packs/day for 10 years    Types: Cigarettes  . Smokeless tobacco: Never Used  . Alcohol Use: No  . Drug Use: No  . Sexually Active: Yes   Other Topics Concern  . Not on file   Social History Narrative  . No narrative on file     Review of Systems: Constitutional: positive for fever, chills, myalgias, and fatigue. negative for night sweats or weight changes HEENT: see above Cardiovascular: negative for chest pain or palpitations Respiratory: positive for cough. negative for hemoptysis, wheezing, or shortness of breath Abdominal: negative for abdominal pain, nausea, vomiting or diarrhea Dermatological: positive for rash Neurologic: positive for headache   Physical Exam: Blood pressure 131/81, pulse 86, temperature 98.5 F (36.9 C), temperature source Oral, resp. rate 18, height 5\' 6"  (1.676 m), weight 168 lb 1.6 oz (76.25 kg), SpO2 99.00%., Body mass index is 27.15 kg/(m^2). General: Well developed, well nourished, in no acute distress. Head: Normocephalic, atraumatic, eyes without discharge, sclera non-icteric,  nares are patent. Bilateral auditory canals clear, TM's are without perforation, pearly grey with reflective cone of light bilaterally. No sinus TTP. Oral cavity moist, dentition normal. Posterior pharynx mildly erythematous with post nasal drip. No peritonsillar abscess or tonsillar exudate. Uvula midline. No mucous membrane involvement/blistering.  Neck: Supple. No thyromegaly. Full ROM. Lymph nodes: Less than 2 cm PC. Lungs: Clear bilaterally to auscultation without wheezes,  rales, or rhonchi. Breathing is unlabored. Heart: RRR with S1 S2. No murmurs, rubs, or gallops appreciated. Abdomen: Soft, non-tender, non-distended with normoactive bowel sounds. No hepatomegaly. No rebound/guarding. No obvious abdominal masses. Msk:  Strength and tone normal for age. Skin: Pinpoint erythematous lesion, some with pustular appearance along anterior trunk, posterior trunk, posterior neck, and extremities.  Neuro: Alert and oriented X 3. Moves all extremities spontaneously. CNII-XII grossly in tact. Psych:  Responds to questions appropriately with a normal affect.   Labs: Results for orders placed in visit on 11/17/12  POCT CBC      Result Value Range   WBC 6.9  4.6 - 10.2 K/uL   Lymph, poc 1.8  0.6 - 3.4   POC LYMPH PERCENT 25.7  10 - 50 %L   MID (cbc) 0.5  0 - 0.9   POC MID % 6.7  0 - 12 %M   POC Granulocyte 4.7  2 - 6.9   Granulocyte percent 67.6  37 - 80 %G   RBC 4.17  4.04 - 5.48 M/uL   Hemoglobin 12.3  12.2 - 16.2 g/dL   HCT, POC 30.8  65.7 - 47.9 %   MCV 93.9  80 - 97 fL   MCH, POC 29.5  27 - 31.2 pg   MCHC 31.4 (*) 31.8 - 35.4 g/dL   RDW, POC 84.6     Platelet Count, POC 278  142 - 424 K/uL   MPV 8.6  0 - 99.8 fL  POCT RAPID STREP A (OFFICE)      Result Value Range   Rapid Strep A Screen Negative  Negative    Throat culture pending  ASSESSMENT AND PLAN:  37 y.o. female with viral pharyngitis, viral exanthem, and cough  -Hycodan #4oz 1 tsp po q 4-6 hours prn cough no RF SED -Declines Doxycycline at this time, she will follow up in 48 hours and reconsider at that time -Tylenol/Motrin prn -Rest/fluids -Stop neurontin at this time -RTC/ER precautions -Recheck 48 hours  Signed, Eula Listen, PA-C 11/17/2012 6:04 PM

## 2012-11-19 ENCOUNTER — Ambulatory Visit (INDEPENDENT_AMBULATORY_CARE_PROVIDER_SITE_OTHER): Payer: BC Managed Care – PPO | Admitting: Emergency Medicine

## 2012-11-19 VITALS — BP 100/68 | HR 58 | Temp 98.6°F | Resp 16 | Ht 68.0 in | Wt 171.0 lb

## 2012-11-19 DIAGNOSIS — R21 Rash and other nonspecific skin eruption: Secondary | ICD-10-CM

## 2012-11-19 DIAGNOSIS — R51 Headache: Secondary | ICD-10-CM

## 2012-11-19 DIAGNOSIS — R5383 Other fatigue: Secondary | ICD-10-CM

## 2012-11-19 DIAGNOSIS — R5381 Other malaise: Secondary | ICD-10-CM

## 2012-11-19 MED ORDER — DOXYCYCLINE HYCLATE 100 MG PO CAPS: 100.0000 mg | ORAL_CAPSULE | Freq: Two times a day (BID) | ORAL | Status: DC

## 2012-11-19 MED ORDER — HYDROCODONE-ACETAMINOPHEN 5-325 MG PO TABS: 1.0000 | ORAL_TABLET | Freq: Four times a day (QID) | ORAL | Status: DC | PRN

## 2012-11-19 NOTE — Progress Notes (Signed)
Patient ID: [name] MRN: [id number], DOB: Jun 12, 1976, 37 y.o. Date of Encounter: 11/19/2012, 8:07 PM  Primary Physician: Sonda Primes, MD  Chief Complaint: Follow up  HPI: 37 y.o. female with history below presents for follow up pharyngitis and rash. Pharyngitis has improved, although when she woke up this morning she states that her throat was more swollen, that has since improved. She also feels like her rash is improving, although that too has a more "purpulish" hue to it. She does complain of a headache today. Headache is dull in nature and a 7/10. It is not the worst headache of her life. It has been gradual in onset. She feels "tired." Cough has improved. She has been without a fever since I last saw her. Still no known tick bites, or known tick exposures. No N/V/D.    Past Medical History  Diagnosis Date  . LBP (low back pain) 2011  . Vitamin B12 deficiency 2012  . Vitamin D deficiency 2012  . Depression      Home Meds: Prior to Admission medications   Medication Sig Start Date End Date Taking? Authorizing Provider  ALPRAZolam (XANAX) 0.25 MG tablet Take 0.25 mg by mouth at bedtime as needed.  06/02/12  Yes Historical Provider, MD  cyclobenzaprine (FLEXERIL) 5 MG tablet Take 5-10 mg by mouth at bedtime as needed. 11/29/11 09/04/17 Yes Georgina Quint Plotnikov, MD  escitalopram (LEXAPRO) 10 MG tablet Take 10 mg by mouth 2 (two) times daily.    Yes Historical Provider, MD  gabapentin (NEURONTIN) 100 MG capsule Take 1 capsule (100 mg total) by mouth 3 (three) times daily. 09/20/12  Yes Tresa Garter, MD  HYDROcodone-homatropine [facility]) 5-1.5 MG/5ML syrup 1 TSP PO Q 4-6 HOURS PRN COUGH 11/17/12  Yes Nelly Scriven M Tierney Behl, PA-C  nabumetone (RELAFEN) 500 MG tablet Take 1 tablet (500 mg total) by mouth 2 (two) times daily. 08/03/12  Yes Georgina Quint Plotnikov, MD  traMADol (ULTRAM) 50 MG tablet Take 1-2 tablets (50-100 mg total) by mouth 2 (two) times daily as needed for pain. 11/15/12  Yes Tresa Garter, MD                  Allergies: No Known Allergies  History   Social History  . Marital Status: Married    Spouse Name: N/A    Number of Children: N/A  . Years of Education: N/A   Occupational History  . Not on file.   Social History Main Topics  . Smoking status: Current Some Day Smoker -- 0.30 packs/day for 10 years    Types: Cigarettes  . Smokeless tobacco: Never Used  . Alcohol Use: No  . Drug Use: No  . Sexually Active: Yes   Other Topics Concern  . Not on file   Social History Narrative  . No narrative on file     Review of Systems: Constitutional: positive for fatigue. negative for chills, fever, night sweats, or weight changes  HEENT: see above Cardiovascular: negative for chest pain or palpitations Respiratory: negative for hemoptysis, wheezing, shortness of breath, or cough Abdominal: negative for abdominal pain, nausea, vomiting, diarrhea, or constipation Dermatological: positive for rash Neurologic: positive for headache. negative for dizziness, or syncope   Physical Exam: Blood pressure 100/68, pulse 58, temperature 98.6 F (37 C), temperature source Oral, resp. rate 16, height 5\' 8"  (1.727 m), weight 171 lb (77.565 kg), SpO2 100.00%., Body mass index is 26.01 kg/(m^2). General: Well developed, well nourished, in no acute distress. Head:  Normocephalic, atraumatic, eyes without discharge, sclera non-icteric, nares are without discharge. Bilateral auditory canals clear, TM's are without perforation, pearly grey and translucent with reflective cone of light bilaterally. Oral cavity moist, posterior pharynx mildly erythematous with post nasal drip. No exudate or peritonsillar abscess. Uvula midline.   Neck: Supple. No thyromegaly. Full ROM. No lymphadenopathy. Lungs: Clear bilaterally to auscultation without wheezes, rales, or rhonchi. Breathing is unlabored. Heart: RRR with S1 S2. No murmurs, rubs, or gallops appreciated. Abdomen: Soft,  non-tender, non-distended with normoactive bowel sounds. No hepatomegaly. No rebound/guarding. No obvious abdominal masses. Msk:  Strength and tone normal for age. Extremities/Skin: Improved pinpoint erythematous lesions consistent with petechiae. Lesions along the back and posterior neck have fully resolved. Lesions along the extremities have mostly resolved.   Neuro: Alert and oriented X 3. Moves all extremities spontaneously. Gait is normal. CNII-XII grossly in tact. Psych:  Responds to questions appropriately with a normal affect.   Labs: Results for orders placed in visit on 11/17/12  CULTURE, GROUP A STREP      Result Value Range   Preliminary Report No Suspicious Colonies, Continuing to Hold    POCT CBC      Result Value Range   WBC 6.9  4.6 - 10.2 K/uL   Lymph, poc 1.8  0.6 - 3.4   POC LYMPH PERCENT 25.7  10 - 50 %L   MID (cbc) 0.5  0 - 0.9   POC MID % 6.7  0 - 12 %M   POC Granulocyte 4.7  2 - 6.9   Granulocyte percent 67.6  37 - 80 %G   RBC 4.17  4.04 - 5.48 M/uL   Hemoglobin 12.3  12.2 - 16.2 g/dL   HCT, POC 40.9  81.1 - 47.9 %   MCV 93.9  80 - 97 fL   MCH, POC 29.5  27 - 31.2 pg   MCHC 31.4 (*) 31.8 - 35.4 g/dL   RDW, POC 91.4     Platelet Count, POC 278  142 - 424 K/uL   MPV 8.6  0 - 99.8 fL  POCT RAPID STREP A (OFFICE)      Result Value Range   Rapid Strep A Screen Negative  Negative     ASSESSMENT AND PLAN:  37 y.o. female with improving likely viral exanthem, pharyngitis, and headache -Like with discussed at her previous visit this is likely viral; however at this time we cannot rule out the possibility of tick borne illness. At this time I recommend coverage with Doxycycline. She denies any possibility of pregnancy. -Doxycycline 100 mg 1 po bid 320 no RF -Norco 5/325 mg 1 po q 4-6 hours prn pain #30 no RF, SED -Rest/fluids -RTC/ER precautions -Call/RTC if HA persists  -Discussed with and examined by Dr. Cleta Alberts    Signed, Eula Listen, PA-C 11/19/2012 8:07  PM

## 2012-11-20 LAB — CULTURE, GROUP A STREP: Organism ID, Bacteria: NORMAL

## 2012-12-05 ENCOUNTER — Telehealth: Payer: Self-pay | Admitting: Internal Medicine

## 2012-12-05 NOTE — Telephone Encounter (Signed)
Patient Information:  Caller Name: Ataya  Phone: [phone]  Patient: [name], [name]  Gender: Female  DOB: Mar 05, 1976  Age: 37 Years  PCP: Plotnikov, Alex (Adults only)  Pregnant: No  Office Follow Up:  Does the office need to follow up with this patient?: No  Instructions For The Office: N/A  RN Note:  Patient declines appt. and will try home care instructions. Call back parameters reviewed. Understanding expressed.  Symptoms  Reason For Call & Symptoms: Patient states onset of neck pain on Sunday. "Like a crick".  Pain with turning head side to side or forward. Describes "shooting pain with movement". Radiates to shoulders and arms.  She has tried ice, muscle relaxer and is not having any relief.  Denies injury.  No illness.  Completed Doxycycline two weeks ago for flu like symptoms. No tick bite  Reviewed Health History In EMR: Yes  Reviewed Medications In EMR: Yes  Reviewed Allergies In EMR: Yes  Reviewed Surgeries / Procedures: Yes  Date of Onset of Symptoms: 12/03/2012  Treatments Tried: ice, muscle relaxer, nabutone ?  Treatments Tried Worked: No OB / GYN:  LMP: 11/21/2012  Guideline(s) Used:  Neck Pain or Stiffness  Disposition Per Guideline:   See Within 3 Days in Office  Reason For Disposition Reached:   Moderate neck pain (e.g., interferes with normal activities like work or school)  Advice Given:  Apply Cold to the Area Or Heat:   During the first 2 days after a mild injury, apply a cold pack or an ice bag (wrapped in a towel) for 20 minutes four times a day. After 2 days, apply a heating pad or hot water bottle to the most painful area for 20 minutes whenever the pain flares up. Wrap hot water bottles or heating pads in a towel to avoid burns.  Sleep:  Sleep on your back or side, not on your abdomen.  Stretching Exercises:  After 48 hours of protecting the neck, begin gentle stretching exercises.  Improve the tone of the neck muscles with 2 or 3 minutes of  gentle stretching exercises per day such as touching the chin to each shoulder, touching the ear to each shoulder, and moving the head forward and backward.  Don't apply any resistance during these stretching exercises.  Pain Medicines:  For pain relief, you can take either acetaminophen, ibuprofen, or naproxen.  They are over-the-counter (OTC) pain drugs. You can buy them at the drugstore.  Acetaminophen (e.g., Tylenol):  Extra Strength Tylenol: Take 1,000 mg (two 500 mg pills) every 8 hours as needed. Each Extra Strength Tylenol pill has 500 mg of acetaminophen.  Ibuprofen (e.g., Motrin, Advil):  Take 400 mg (two 200 mg pills) by mouth every 6 hours.  Good Body Mechanics:  Lifting: Stand close to the object to be lifted. Keep your back straight and lift by bending your legs. Ask for help if needed.  Sleeping: Sleep on a firm mattress.  Sitting: Avoid sitting for long periods of time without a break. Avoid slouching. Place a pillow or towel behind your lower back for support.  Computer screen: place at eye level.  Posture: Maintain good posture.  Call Back If:  You become worse.  Call Back If:  Numbness or weakness occurs  You become worse.  RN Overrode Recommendation:  Follow Up With Office Later  will try home care

## 2012-12-08 ENCOUNTER — Ambulatory Visit: Payer: BC Managed Care – PPO

## 2012-12-08 ENCOUNTER — Other Ambulatory Visit: Payer: Self-pay | Admitting: Emergency Medicine

## 2012-12-08 ENCOUNTER — Ambulatory Visit (INDEPENDENT_AMBULATORY_CARE_PROVIDER_SITE_OTHER): Payer: BC Managed Care – PPO | Admitting: Emergency Medicine

## 2012-12-08 VITALS — BP 128/80 | HR 95 | Temp 98.9°F | Resp 18 | Ht 66.0 in | Wt 168.0 lb

## 2012-12-08 DIAGNOSIS — R05 Cough: Secondary | ICD-10-CM

## 2012-12-08 DIAGNOSIS — R059 Cough, unspecified: Secondary | ICD-10-CM

## 2012-12-08 DIAGNOSIS — R51 Headache: Secondary | ICD-10-CM

## 2012-12-08 DIAGNOSIS — R5381 Other malaise: Secondary | ICD-10-CM

## 2012-12-08 DIAGNOSIS — M542 Cervicalgia: Secondary | ICD-10-CM

## 2012-12-08 LAB — POCT CBC
Granulocyte percent: 70.8 %G (ref 37–80)
Hemoglobin: 14.2 g/dL (ref 12.2–16.2)
MCH, POC: 30.6 pg (ref 27–31.2)
MCV: 96.7 fL (ref 80–97)
MPV: 9 fL (ref 0–99.8)
POC MID %: 7 %M (ref 0–12)
RBC: 4.64 M/uL (ref 4.04–5.48)
WBC: 7.1 10*3/uL (ref 4.6–10.2)

## 2012-12-08 LAB — POCT URINE PREGNANCY: Preg Test, Ur: NEGATIVE

## 2012-12-08 MED ORDER — ALBUTEROL SULFATE HFA 108 (90 BASE) MCG/ACT IN AERS: 2.0000 | INHALATION_SPRAY | RESPIRATORY_TRACT | Status: DC | PRN

## 2012-12-08 MED ORDER — TIZANIDINE HCL 2 MG PO TABS: 2.0000 mg | ORAL_TABLET | Freq: Three times a day (TID) | ORAL | Status: DC | PRN

## 2012-12-08 MED ORDER — HYDROCODONE-ACETAMINOPHEN 5-325 MG PO TABS: 1.0000 | ORAL_TABLET | Freq: Four times a day (QID) | ORAL | Status: DC | PRN

## 2012-12-08 MED ORDER — ALBUTEROL SULFATE (2.5 MG/3ML) 0.083% IN NEBU
2.5000 mg | INHALATION_SOLUTION | Freq: Once | RESPIRATORY_TRACT | Status: AC
  Administered 2012-12-08: 2.5 mg via RESPIRATORY_TRACT

## 2012-12-08 NOTE — Patient Instructions (Signed)
Asthma, Adult Asthma is caused by narrowing of the air passages in the lungs. It may be triggered by pollen, dust, animal dander, molds, some foods, respiratory infections, exposure to smoke, exercise, emotional stress or other allergens (things that cause allergic reactions or allergies). Repeat attacks are common. HOME CARE INSTRUCTIONS   Use prescription medications as ordered by your caregiver.  Avoid pollen, dust, animal dander, molds, smoke and other things that cause attacks at home and at work.  You may have fewer attacks if you decrease dust in your home. Electrostatic air cleaners may help.  It may help to replace your pillows or mattress with materials less likely to cause allergies.  Talk to your caregiver about an action plan for managing asthma attacks at home, including, the use of a peak flow meter which measures the severity of your asthma attack. An action plan can help minimize or stop the attack without having to seek medical care.  If you are not on a fluid restriction, drink 8 to 10 glasses of water each day.  Always have a plan prepared for seeking medical attention, including, calling your physician, accessing local emergency care, and calling 911 (in the [place] for a severe attack.  Discuss possible exercise routines with your caregiver.  If animal dander is the cause of asthma, you may need to get rid of pets. SEEK MEDICAL CARE IF:   You have wheezing and shortness of breath even if taking medicine to prevent attacks.  You have muscle aches, chest pain or thickening of sputum.  Your sputum changes from clear or white to yellow, green, gray, or bloody.  You have any problems that may be related to the medicine you are taking (such as a rash, itching, swelling or trouble breathing). SEEK IMMEDIATE MEDICAL CARE IF:   Your usual medicines do not stop your wheezing or there is increased coughing and/or shortness of breath.  You have increased difficulty  breathing.  You have a fever. MAKE SURE YOU:   Understand these instructions.  Will watch your condition.  Will get help right away if you are not doing well or get worse. Document Released: 07/12/2005 Document Revised: 10/04/2011 Document Reviewed: 02/28/2008 [facility] Patient Information 2013 [place], [place]. Cervical Sprain A cervical sprain is an injury in the neck in which the ligaments are stretched or torn. The ligaments are the tissues that hold the bones of the neck (vertebrae) in place.Cervical sprains can range from very mild to very severe. Most cervical sprains get better in 1 to 3 weeks, but it depends on the cause and extent of the injury. Severe cervical sprains can cause the neck vertebrae to be unstable. This can lead to damage of the spinal cord and can result in serious nervous system problems. Your caregiver will determine whether your cervical sprain is mild or severe. CAUSES  Severe cervical sprains may be caused by:  Contact sport injuries (football, rugby, wrestling, hockey, auto racing, gymnastics, diving, martial arts, boxing).  Motor vehicle collisions.  Whiplash injuries. This means the neck is forcefully whipped backward and forward.  Falls. Mild cervical sprains may be caused by:   Awkward positions, such as cradling a telephone between your ear and shoulder.  Sitting in a chair that does not offer proper support.  Working at a poorly Marketing executive station.  Activities that require looking up or down for long periods of time. SYMPTOMS   Pain, soreness, stiffness, or a burning sensation in the front, back, or sides of the neck.  This discomfort may develop immediately after injury or it may develop slowly and not begin for 24 hours or more after an injury.  Pain or tenderness directly in the middle of the back of the neck.  Shoulder or upper back pain.  Limited ability to move the neck.  Headache.  Dizziness.  Weakness, numbness, or  tingling in the hands or arms.  Muscle spasms.  Difficulty swallowing or chewing.  Tenderness and swelling of the neck. DIAGNOSIS  Most of the time, your caregiver can diagnose this problem by taking your history and doing a physical exam. Your caregiver will ask about any known problems, such as arthritis in the neck or a previous neck injury. X-rays may be taken to find out if there are any other problems, such as problems with the bones of the neck. However, an X-ray often does not reveal the full extent of a cervical sprain. Other tests such as a computed tomography (CT) scan or magnetic resonance imaging (MRI) may be needed. TREATMENT  Treatment depends on the severity of the cervical sprain. Mild sprains can be treated with rest, keeping the neck in place (immobilization), and pain medicines. Severe cervical sprains need immediate immobilization and an appointment with an orthopedist or neurosurgeon. Several treatment options are available to help with pain, muscle spasms, and other symptoms. Your caregiver may prescribe:  Medicines, such as pain relievers, numbing medicines, or muscle relaxants.  Physical therapy. This can include stretching exercises, strengthening exercises, and posture training. Exercises and improved posture can help stabilize the neck, strengthen muscles, and help stop symptoms from returning.  A neck collar to be worn for short periods of time. Often, these collars are worn for comfort. However, certain collars may be worn to protect the neck and prevent further worsening of a serious cervical sprain. HOME CARE INSTRUCTIONS   Put ice on the injured area.  Put ice in a plastic bag.  Place a towel between your skin and the bag.  Leave the ice on for 15 to 20 minutes, 3 to 4 times a day.  Only take over-the-counter or prescription medicines for pain, discomfort, or fever as directed by your caregiver.  Keep all follow-up appointments as directed by your  caregiver.  Keep all physical therapy appointments as directed by your caregiver.  If a neck collar is prescribed, wear it as directed by your caregiver.  Do not drive while wearing a neck collar.  Make any needed adjustments to your work station to promote good posture.  Avoid positions and activities that make your symptoms worse.  Warm up and stretch before being active to help prevent problems. SEEK MEDICAL CARE IF:   Your pain is not controlled with medicine.  You are unable to decrease your pain medicine over time as planned.  Your activity level is not improving as expected. SEEK IMMEDIATE MEDICAL CARE IF:   You develop any bleeding, stomach upset, or signs of an allergic reaction to your medicine.  Your symptoms get worse.  You develop new, unexplained symptoms.  You have numbness, tingling, weakness, or paralysis in any part of your body. MAKE SURE YOU:   Understand these instructions.  Will watch your condition.  Will get help right away if you are not doing well or get worse. Document Released: 05/09/2007 Document Revised: 10/04/2011 Document Reviewed: 04/14/2011 [facility] Patient Information 2013 [place], [place].

## 2012-12-08 NOTE — Progress Notes (Signed)
  Subjective:    Patient ID: [name], female    DOB: 1976-03-26, 37 y.o.   MRN: [id number]  HPI  37 year old female presents with:   1. Cough, wheezing x 2-3 days- no history of asthma.  Takes allegra on a regular basis.  This year allergies have been worse.  No fever, non productive cough, feels tired No lower limb swelling or discomfort  2. Neck pain, stiffness x 5 days- Playing kickball with kids and felt a little discomfort in neck 5 days ago, the next day had trouble moving neck, pain, stiffness, no real improvement Flexeril and relafen not helping  Review of Systems     Objective:   Physical Exam patient is tachypneic  in no distress. Her neck exam reveals decreased range of motion to the right and left and decreased flexion extension. Deep tendon reflexes of the upper extremities are 2+ and symmetrical motor strength is symmetrical. Throat is normal. Chest exam reveals good breath sounds bilaterally no wheezes were heard. Cardiac exam is regular rate and rhythm. Calves are nontender there is no swelling. 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 Results for orders placed in visit on 12/08/12  POCT URINE PREGNANCY      Result Value Range   Preg Test, Ur Negative    POCT CBC      Result Value Range   WBC 7.1  4.6 - 10.2 K/uL   Lymph, poc 1.6  0.6 - 3.4   POC LYMPH PERCENT 22.2  10 - 50 %L   MID (cbc) 0.5  0 - 0.9   POC MID % 7.0  0 - 12 %M   POC Granulocyte 5.0  2 - 6.9   Granulocyte percent 70.8  37 - 80 %G   RBC 4.64  4.04 - 5.48 M/uL   Hemoglobin 14.2  12.2 - 16.2 g/dL   HCT, POC 78.2  95.6 - 47.9 %   MCV 96.7  80 - 97 fL   MCH, POC 30.6  27 - 31.2 pg   MCHC 31.6 (*) 31.8 - 35.4 g/dL   RDW, POC 21.3     Platelet Count, POC 301  142 - 424 K/uL   MPV 9.0  0 - 99.8 fL          Assessment & Plan:

## 2012-12-11 LAB — T4, FREE: Free T4: 1.1 ng/dL (ref 0.80–1.80)

## 2012-12-12 ENCOUNTER — Telehealth: Payer: Self-pay

## 2012-12-12 MED ORDER — TRAMADOL HCL 50 MG PO TABS: 50.0000 mg | ORAL_TABLET | Freq: Two times a day (BID) | ORAL | Status: DC | PRN

## 2012-12-12 NOTE — Telephone Encounter (Signed)
Phone call from pt stating she was seen in urgent care on Friday for a cough that has not gotten better. She was given a prescription for albuterol that she has not filled due to it being expensive and she states her husband already had one. She was also seen for neck pain and had an xray and given a 3 day pain medicine prescription. Her neck is still painful and she is wondering is she needs an MRI, as urgent care told her she may. She would like a refill on Tramadol. She uses [facility] in [place] on [address]. Please advise.

## 2012-12-12 NOTE — Telephone Encounter (Signed)
OK to ref Tramadol (no ref) Sch OV Thx

## 2012-12-12 NOTE — Telephone Encounter (Signed)
Left message for pt that rx discussed earlier has been refilled and sent to the pharmacy and to schedule a f/u appt

## 2012-12-13 ENCOUNTER — Telehealth: Payer: Self-pay | Admitting: Radiology

## 2012-12-13 DIAGNOSIS — M542 Cervicalgia: Secondary | ICD-10-CM

## 2012-12-13 NOTE — Telephone Encounter (Signed)
Called her about labs, she still has c/o neck pain, asking if we can proceed with MRI

## 2012-12-13 NOTE — Telephone Encounter (Signed)
Okay to proceed with MRI of the neck no contrast.

## 2012-12-14 NOTE — Telephone Encounter (Signed)
Ordered scan for her and advised her.

## 2012-12-15 ENCOUNTER — Telehealth: Payer: Self-pay | Admitting: Emergency Medicine

## 2012-12-15 DIAGNOSIS — M542 Cervicalgia: Secondary | ICD-10-CM

## 2012-12-15 DIAGNOSIS — R51 Headache: Secondary | ICD-10-CM

## 2012-12-15 MED ORDER — HYDROCODONE-ACETAMINOPHEN 5-325 MG PO TABS: 1.0000 | ORAL_TABLET | Freq: Four times a day (QID) | ORAL | Status: DC | PRN

## 2012-12-15 NOTE — Telephone Encounter (Signed)
rx printed.  Patient notified by My Chart.

## 2012-12-25 ENCOUNTER — Other Ambulatory Visit: Payer: Self-pay | Admitting: Emergency Medicine

## 2012-12-25 DIAGNOSIS — M542 Cervicalgia: Secondary | ICD-10-CM

## 2012-12-25 DIAGNOSIS — R51 Headache: Secondary | ICD-10-CM

## 2012-12-26 MED ORDER — HYDROCODONE-ACETAMINOPHEN 5-325 MG PO TABS: 1.0000 | ORAL_TABLET | Freq: Four times a day (QID) | ORAL | Status: DC | PRN

## 2012-12-27 ENCOUNTER — Telehealth: Payer: Self-pay | Admitting: Emergency Medicine

## 2012-12-27 ENCOUNTER — Telehealth: Payer: Self-pay

## 2012-12-27 NOTE — Telephone Encounter (Signed)
Patient said she saw on MyChart that her hydrocone had been filled however when she went to [facility] to pick it up it was not there. Callback#: [phone]

## 2012-12-27 NOTE — Telephone Encounter (Signed)
Called in for her called her to advise

## 2012-12-27 NOTE — Telephone Encounter (Signed)
Please call patient and let her now I do not want her to take the ultram and hydrocodone. It would be good to followup with Dr. Jennette Banker off after the MRI and then he can be managing your pain medications .

## 2012-12-27 NOTE — Telephone Encounter (Signed)
[facility] had called to get authorization to fill hydrocodone d/t pt having been Rxd Tramadol recently by Dr Posey Rea. Discussed w/Dr Cleta Alberts who gave verbal authorization for this RF, but left message below for [place] to relay to pt. Called [facility] and gave authorization for RF. LMOM for pt to CB to give her Dr Ellis Parents instr's.

## 2012-12-28 ENCOUNTER — Telehealth: Payer: Self-pay | Admitting: Radiology

## 2012-12-28 NOTE — Telephone Encounter (Signed)
Patients MRI scan was not authorized by her insurance carrier. I have called her to advise.

## 2013-01-05 ENCOUNTER — Other Ambulatory Visit: Payer: Self-pay | Admitting: Internal Medicine

## 2013-01-05 NOTE — Telephone Encounter (Signed)
Please advise refill? 

## 2013-01-09 ENCOUNTER — Other Ambulatory Visit: Payer: Self-pay | Admitting: Internal Medicine

## 2013-01-10 NOTE — Telephone Encounter (Signed)
Please advise on Rf. Pt has called multiple times.

## 2013-01-25 ENCOUNTER — Other Ambulatory Visit (INDEPENDENT_AMBULATORY_CARE_PROVIDER_SITE_OTHER): Payer: BC Managed Care – PPO

## 2013-01-25 ENCOUNTER — Encounter: Payer: Self-pay | Admitting: Internal Medicine

## 2013-01-25 ENCOUNTER — Telehealth: Payer: Self-pay | Admitting: *Deleted

## 2013-01-25 ENCOUNTER — Ambulatory Visit (INDEPENDENT_AMBULATORY_CARE_PROVIDER_SITE_OTHER): Payer: BC Managed Care – PPO | Admitting: Internal Medicine

## 2013-01-25 VITALS — BP 116/80 | HR 80 | Resp 16 | Wt 171.0 lb

## 2013-01-25 DIAGNOSIS — F32A Depression, unspecified: Secondary | ICD-10-CM

## 2013-01-25 DIAGNOSIS — R5383 Other fatigue: Secondary | ICD-10-CM

## 2013-01-25 DIAGNOSIS — M545 Low back pain, unspecified: Secondary | ICD-10-CM

## 2013-01-25 DIAGNOSIS — M79605 Pain in left leg: Secondary | ICD-10-CM

## 2013-01-25 DIAGNOSIS — R5381 Other malaise: Secondary | ICD-10-CM

## 2013-01-25 DIAGNOSIS — F3289 Other specified depressive episodes: Secondary | ICD-10-CM

## 2013-01-25 DIAGNOSIS — E538 Deficiency of other specified B group vitamins: Secondary | ICD-10-CM

## 2013-01-25 DIAGNOSIS — F329 Major depressive disorder, single episode, unspecified: Secondary | ICD-10-CM

## 2013-01-25 LAB — HEPATIC FUNCTION PANEL
ALT: 19 U/L (ref 0–35)
Bilirubin, Direct: 0.1 mg/dL (ref 0.0–0.3)
Total Bilirubin: 0.7 mg/dL (ref 0.3–1.2)

## 2013-01-25 LAB — TSH: TSH: 3.04 u[IU]/mL (ref 0.35–5.50)

## 2013-01-25 LAB — VITAMIN B12: Vitamin B-12: 913 pg/mL — ABNORMAL HIGH (ref 211–911)

## 2013-01-25 MED ORDER — ESCITALOPRAM OXALATE 10 MG PO TABS: 10.0000 mg | ORAL_TABLET | Freq: Every day | ORAL | Status: DC

## 2013-01-25 MED ORDER — GABAPENTIN 100 MG PO CAPS: 100.0000 mg | ORAL_CAPSULE | Freq: Every evening | ORAL | Status: DC | PRN

## 2013-01-25 MED ORDER — DIAZEPAM 5 MG PO TABS: 5.0000 mg | ORAL_TABLET | Freq: Every evening | ORAL | Status: DC | PRN

## 2013-01-25 MED ORDER — NABUMETONE 500 MG PO TABS: 500.0000 mg | ORAL_TABLET | Freq: Two times a day (BID) | ORAL | Status: DC

## 2013-01-25 MED ORDER — TRAMADOL HCL 50 MG PO TABS: 50.0000 mg | ORAL_TABLET | Freq: Two times a day (BID) | ORAL | Status: DC | PRN

## 2013-01-25 NOTE — Telephone Encounter (Signed)
Ok to fill early this time only 1-2 tabs bid prn is correct Thx!

## 2013-01-25 NOTE — Telephone Encounter (Signed)
[name] from the Pharmacy called requesting clarification on Tramadol Rx.  Requests why pt is receiving a 15 day early refill on medication and also requests clarification on dosage whether pt is to take 1 tab BID or 1-2 tabs BID.  Also states pt is only wanting to fill the Tramadol Rx.  Please advise

## 2013-01-25 NOTE — Progress Notes (Signed)
Subjective:   C/o being tired and sleepy all the time x 1 week Sleeping a lot. Depressed. C/o more pain. She ran out of Tramadol several days ago.   Back Pain This is a chronic problem. The current episode started more than 1 year ago. The problem occurs constantly. The problem has been gradually improving since onset. The pain is present in the lumbar spine and gluteal. The quality of the pain is described as aching and shooting. The pain radiates to the left thigh. The pain is at a severity of 4/10. The pain is moderate. The pain is worse during the day. The symptoms are aggravated by standing. Associated symptoms include numbness. Pertinent negatives include no chest pain, fever or headaches. Risk factors include obesity. The treatment provided moderate relief.  LBP is worse w/her periods  F/u depression.  She failed a drug test w/Dr Benjamine Mola - ETOH and amphet (+) - Per pt; she took Vyvance in error in place of Gabapentin 11/13. PT was advised - too $$$   Wt Readings from Last 3 Encounters:  01/25/13 171 lb (77.565 kg)  12/08/12 168 lb (76.204 kg)  11/19/12 171 lb (77.565 kg)   BP Readings from Last 3 Encounters:  01/25/13 116/80  12/08/12 128/80  11/19/12 100/68      Past Medical History  Diagnosis Date  . LBP (low back pain) 2011  . Vitamin B12 deficiency 2012  . Vitamin D deficiency 2012  . Depression      Review of Systems  Constitutional: Negative for fever.  HENT: Negative for ear pain.   Respiratory: Negative for choking, chest tightness and stridor.   Cardiovascular: Negative for chest pain and leg swelling.  Musculoskeletal: Positive for back pain and gait problem (some L hip pain). Negative for myalgias.  Skin: Negative for wound.  Neurological: Positive for numbness. Negative for seizures, syncope and headaches.  Psychiatric/Behavioral: Positive for dysphoric mood. Negative for suicidal ideas, behavioral problems, sleep disturbance, self-injury,  decreased concentration and agitation. The patient is nervous/anxious.        Objective:   Physical Exam  Constitutional: She appears well-developed and well-nourished. No distress.  HENT:  Head: Normocephalic.  Right Ear: External ear normal.  Left Ear: External ear normal.  Nose: Nose normal.  Mouth/Throat: Oropharynx is clear and moist.  Eyes: Conjunctivae are normal. Pupils are equal, round, and reactive to light. Right eye exhibits no discharge. Left eye exhibits no discharge.  Neck: Normal range of motion. Neck supple. No JVD present. No tracheal deviation present. No thyromegaly present.  Cardiovascular: Normal rate, regular rhythm and normal heart sounds.   Pulmonary/Chest: No stridor. No respiratory distress. She has no wheezes.  Abdominal: Soft. Bowel sounds are normal. She exhibits no distension and no mass. There is no tenderness. There is no rebound and no guarding.  Musculoskeletal: She exhibits tenderness. She exhibits no edema.  LS is tender Strait leg elev is pos on L L troch major is tender to palp  Lymphadenopathy:    She has no cervical adenopathy.  Neurological: She displays normal reflexes. No cranial nerve deficit. She exhibits normal muscle tone. Coordination normal.  Skin: No rash noted. No erythema.  Psychiatric: Her behavior is normal. Judgment and thought content normal.  tearful   B heels are tender to palp L LS and L SI is tender  Mouth opening is nl   Lab Results  Component Value Date   WBC 7.1 12/08/2012   HGB 14.2 12/08/2012   HCT 44.9  12/08/2012   PLT 262.0 07/01/2011   GLUCOSE 88 01/11/2012   ALT 9 01/11/2012   AST 15 01/11/2012   NA 137 01/11/2012   K 4.5 01/11/2012   CL 103 01/11/2012   CREATININE 0.82 01/11/2012   BUN 11 01/11/2012   CO2 25 01/11/2012   TSH 4.998* 12/08/2012    LS MRI    Assessment & Plan:

## 2013-01-25 NOTE — Telephone Encounter (Signed)
[name] needs a specific as to why pt is consistently refilling the Tramadol early.  Please advise

## 2013-01-25 NOTE — Assessment & Plan Note (Addendum)
Chronic - due to pain. Lexapro helped w/moodiness Continue with current prescription therapy as reflected on the Med list. Labs

## 2013-01-25 NOTE — Assessment & Plan Note (Signed)
7/14 L sterno-clav joint is swollen and a little tender x 1 mo

## 2013-01-25 NOTE — Assessment & Plan Note (Signed)
Continue with current prescription therapy as reflected on the Med list.  

## 2013-01-25 NOTE — Assessment & Plan Note (Signed)
Labs  Reduce some meds

## 2013-01-29 NOTE — Telephone Encounter (Signed)
Irving Burton at [facility] called again to ask why this patient calls every month to get an early refill.

## 2013-01-29 NOTE — Telephone Encounter (Signed)
Per pt she had to use extra due to pain Thx

## 2013-01-29 NOTE — Telephone Encounter (Signed)
Delice Bison, pharmacist informed.

## 2013-03-06 ENCOUNTER — Other Ambulatory Visit: Payer: Self-pay | Admitting: Internal Medicine

## 2013-03-09 ENCOUNTER — Encounter: Payer: Self-pay | Admitting: Internal Medicine

## 2013-03-09 ENCOUNTER — Other Ambulatory Visit: Payer: Self-pay | Admitting: Internal Medicine

## 2013-03-09 MED ORDER — HYDROCODONE-ACETAMINOPHEN 5-325 MG PO TABS: 1.0000 | ORAL_TABLET | Freq: Two times a day (BID) | ORAL | Status: DC | PRN

## 2013-03-16 ENCOUNTER — Telehealth: Payer: Self-pay | Admitting: Internal Medicine

## 2013-03-16 DIAGNOSIS — M79605 Pain in left leg: Secondary | ICD-10-CM

## 2013-03-16 DIAGNOSIS — M545 Low back pain: Secondary | ICD-10-CM

## 2013-03-16 NOTE — Telephone Encounter (Signed)
Same as before. I can make a ref to a different pain clinic. I can schedule PT. Thx

## 2013-03-16 NOTE — Telephone Encounter (Signed)
Patient is having difficulty bending, causes pain in her back.  She did send an email message.  She does have any appointment scheduled for Tues 03/27/13.  She would like a response back on what she can do in the time between.

## 2013-03-16 NOTE — Telephone Encounter (Signed)
Pt informed. Pt agrees to below.

## 2013-03-17 ENCOUNTER — Emergency Department (HOSPITAL_COMMUNITY)
Admission: EM | Admit: 2013-03-17 | Discharge: 2013-03-17 | Disposition: A | Discharge status: Home / Self Care | Payer: BC Managed Care – PPO | Attending: Emergency Medicine | Admitting: Emergency Medicine

## 2013-03-17 ENCOUNTER — Encounter (HOSPITAL_COMMUNITY): Payer: Self-pay | Admitting: Emergency Medicine

## 2013-03-17 DIAGNOSIS — Z862 Personal history of diseases of the blood and blood-forming organs and certain disorders involving the immune mechanism: Secondary | ICD-10-CM | POA: Insufficient documentation

## 2013-03-17 DIAGNOSIS — G8929 Other chronic pain: Secondary | ICD-10-CM | POA: Insufficient documentation

## 2013-03-17 DIAGNOSIS — M545 Low back pain, unspecified: Secondary | ICD-10-CM | POA: Insufficient documentation

## 2013-03-17 DIAGNOSIS — F3289 Other specified depressive episodes: Secondary | ICD-10-CM | POA: Insufficient documentation

## 2013-03-17 DIAGNOSIS — F17200 Nicotine dependence, unspecified, uncomplicated: Secondary | ICD-10-CM | POA: Insufficient documentation

## 2013-03-17 DIAGNOSIS — R209 Unspecified disturbances of skin sensation: Secondary | ICD-10-CM | POA: Insufficient documentation

## 2013-03-17 DIAGNOSIS — Z8639 Personal history of other endocrine, nutritional and metabolic disease: Secondary | ICD-10-CM | POA: Insufficient documentation

## 2013-03-17 DIAGNOSIS — Z79899 Other long term (current) drug therapy: Secondary | ICD-10-CM | POA: Insufficient documentation

## 2013-03-17 DIAGNOSIS — F329 Major depressive disorder, single episode, unspecified: Secondary | ICD-10-CM | POA: Insufficient documentation

## 2013-03-17 DIAGNOSIS — M2559 Pain in other specified joint: Secondary | ICD-10-CM | POA: Insufficient documentation

## 2013-03-17 DIAGNOSIS — IMO0001 Reserved for inherently not codable concepts without codable children: Secondary | ICD-10-CM | POA: Insufficient documentation

## 2013-03-17 MED ORDER — DEXAMETHASONE SODIUM PHOSPHATE 10 MG/ML IJ SOLN
10.0000 mg | Freq: Once | INTRAMUSCULAR | Status: AC
  Administered 2013-03-17: 10 mg via INTRAMUSCULAR
  Filled 2013-03-17: qty 1

## 2013-03-17 NOTE — ED Notes (Signed)
Pt c/o chronic back pain x 3 years.  States that today is "extra bad".  Pt tearful.

## 2013-03-17 NOTE — ED Provider Notes (Signed)
CSN: [id number]     Arrival date & time 03/17/13  1825 History  This chart was scribed for non-physician practitioner, Magnus Sinning, PA-C working with Junius Argyle, MD by Greggory Stallion, ED scribe. This patient was seen in room WTR9/WTR9 and the patient's care was started at [facility].   Chief Complaint  Patient presents with  . Back Pain    The history is provided by the patient. No language interpreter was used.   HPI Comments: [name] is a 37 y.o. female who presents to the Emergency Department complaining of gradual onset, gradually worsening back pain that radiates to her bilateral hips and shoots to her left leg that started today. Pt has numbness in her left leg. She states she has had chronic back pain for 3 years. Pain today is similar to pain she has had in the past.  Certain movements worsen the pain. She states she has had an MRI done in December 2013 and it showed a few degenerative disks. Pt denies fever, chills, bowel incontinence and urinary incontinence as associated symptoms. Pt has taken Tylenol and Relafen with no relief. She states her PCP recommended trying PT again but she doesn't think it will be helpful. 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 She has seen a neurosurgeon once before but never went back. Pt was given shots in her back with no relief. She has never seen an orthopaedist.   PCP is Dr. Jacinta Shoe  Past Medical History  Diagnosis Date  . LBP (low back pain) 2011  . Vitamin B12 deficiency 2012  . Vitamin D deficiency 2012  . Depression    Past Surgical History  Procedure Laterality Date  . Cesarean section  1-08   Family History  Problem Relation Age of Onset  . Kidney disease Other   . Cancer Mother    History  Substance Use Topics  . Smoking status: Current Some Day Smoker -- 0.30 packs/day for 10 years    Types: Cigarettes  . Smokeless tobacco: Never Used  . Alcohol Use: No   OB  History   Grav Para Term Preterm Abortions TAB SAB Ect Mult Living                 Review of Systems  Constitutional: Negative for fever and chills.  Musculoskeletal: Positive for myalgias, back pain and arthralgias.  Neurological: Positive for numbness.  All other systems reviewed and are negative.    Allergies  Review of patient's allergies indicates no known allergies.  Home Medications   Current Outpatient Rx  Name  Route  Sig  Dispense  Refill  . albuterol (PROVENTIL HFA;VENTOLIN HFA) 108 (90 BASE) MCG/ACT inhaler   Inhalation   Inhale 2 puffs into the lungs every 4 (four) hours as needed for wheezing (cough, shortness of breath or wheezing.).   1 Inhaler   1   . diazepam (VALIUM) 5 MG tablet   Oral   Take 1 tablet (5 mg total) by mouth at bedtime as needed for anxiety or sleep.   30 tablet   0   . escitalopram (LEXAPRO) 10 MG tablet   Oral   Take 1 tablet (10 mg total) by mouth at bedtime.   30 tablet   5   . gabapentin (NEURONTIN) 100 MG capsule   Oral   Take 1 capsule (100 mg total) by mouth at bedtime as needed.   90 capsule   3   .  HYDROcodone-acetaminophen (NORCO/VICODIN) 5-325 MG per tablet   Oral   Take 1 tablet by mouth 2 (two) times daily as needed for pain.   12 tablet   0   . nabumetone (RELAFEN) 500 MG tablet   Oral   Take 1 tablet (500 mg total) by mouth 2 (two) times daily.   60 tablet   3   . traMADol (ULTRAM) 50 MG tablet   Oral   Take 1 tablet (50 mg total) by mouth 2 (two) times daily as needed for pain.   100 tablet   2    BP 150/99  Pulse 90  Temp(Src) 98.5 F (36.9 C) (Oral)  Resp 18  SpO2 98%  Physical Exam  Nursing note and vitals reviewed. Constitutional: She is oriented to person, place, and time. She appears well-developed and well-nourished. No distress.  HENT:  Head: Normocephalic and atraumatic.  Eyes: EOM are normal.  Neck: Neck supple. No tracheal deviation present.  Cardiovascular: Normal rate, regular  rhythm and normal heart sounds.   No murmur heard. Pulmonary/Chest: Effort normal and breath sounds normal. No respiratory distress. She has no wheezes. She has no rales.  Musculoskeletal: Normal range of motion.       Cervical back: She exhibits normal range of motion, no tenderness, no bony tenderness, no swelling, no edema and no deformity.       Thoracic back: She exhibits normal range of motion, no tenderness, no bony tenderness, no swelling, no edema and no deformity.       Lumbar back: She exhibits tenderness. She exhibits normal range of motion, no swelling, no edema and no deformity.  2+ patellar reflexes. Strength 5/5. No ataxia with gait. Ambulates with a limp, favoring the left side.   Neurological: She is alert and oriented to person, place, and time. No cranial nerve deficit.  Skin: Skin is warm and dry.  Psychiatric: She has a normal mood and affect. Her behavior is normal.    ED Course   DIAGNOSTIC STUDIES: Oxygen Saturation is 98% on RA, normal by my interpretation.    COORDINATION OF CARE: 7:40 PM-Discussed treatment plan which includes shot of decadron with pt at bedside and pt agreed to plan. Will give pt a referral to a different neurosurgeon.   Procedures (including critical care time)  Labs Reviewed - No data to display No results found. No diagnosis found.  MDM  Patient with back pain.  No neurological deficits and normal neuro exam.  Patient can walk but states is painful.  No loss of bowel or bladder control.  No concern for cauda equina.  No fever, night sweats, weight loss, h/o cancer, IVDU.  RICE protocol and pain medicine indicated and discussed with patient.   I personally performed the services described in this documentation, which was scribed in my presence. The recorded information has been reviewed and is accurate.   Jasmen Emrich Chauvin, PA-C 03/19/13 2120

## 2013-03-20 NOTE — ED Provider Notes (Signed)
Medical screening examination/treatment/procedure(s) were performed by non-physician practitioner and as supervising physician I was immediately available for consultation/collaboration.   Junius Argyle, MD 03/20/13 1304

## 2013-03-27 ENCOUNTER — Encounter: Payer: Self-pay | Admitting: Internal Medicine

## 2013-03-27 ENCOUNTER — Ambulatory Visit (INDEPENDENT_AMBULATORY_CARE_PROVIDER_SITE_OTHER): Payer: BC Managed Care – PPO | Admitting: Internal Medicine

## 2013-03-27 VITALS — BP 130/84 | HR 86 | Temp 98.1°F | Wt 171.5 lb

## 2013-03-27 DIAGNOSIS — F329 Major depressive disorder, single episode, unspecified: Secondary | ICD-10-CM

## 2013-03-27 DIAGNOSIS — M545 Low back pain, unspecified: Secondary | ICD-10-CM

## 2013-03-27 DIAGNOSIS — E538 Deficiency of other specified B group vitamins: Secondary | ICD-10-CM

## 2013-03-27 DIAGNOSIS — F32A Depression, unspecified: Secondary | ICD-10-CM

## 2013-03-27 MED ORDER — BUPRENORPHINE 15 MCG/HR TD PTWK: 1.0000 | MEDICATED_PATCH | TRANSDERMAL | Status: DC

## 2013-03-27 NOTE — Progress Notes (Signed)
Subjective:   C/o being tired and sleepy all the time x 1 week  Sleeping a lot. Depressed. C/o more pain. She was taking Tramadol several days ago.   Back Pain This is a chronic problem. The current episode started more than 1 year ago. The problem occurs constantly. The problem has been gradually improving since onset. The pain is present in the lumbar spine and gluteal. The quality of the pain is described as aching and shooting. The pain radiates to the left thigh. The pain is at a severity of 4/10. The pain is moderate. The pain is worse during the day. The symptoms are aggravated by standing. Associated symptoms include numbness. Pertinent negatives include no chest pain, fever or headaches. Risk factors include obesity. The treatment provided moderate relief.  LBP is worse w/her periods  F/u depression.  She failed a drug test w/Dr Benjamine Mola - ETOH and amphet (+) - Per pt; she took Vyvance in error in place of Gabapentin 11/13. PT was advised - too $$$   Wt Readings from Last 3 Encounters:  03/27/13 171 lb 8 oz (77.792 kg)  01/25/13 171 lb (77.565 kg)  12/08/12 168 lb (76.204 kg)   BP Readings from Last 3 Encounters:  03/27/13 130/84  03/17/13 150/99  01/25/13 116/80      Past Medical History  Diagnosis Date  . LBP (low back pain) 2011  . Vitamin B12 deficiency 2012  . Vitamin D deficiency 2012  . Depression      Review of Systems  Constitutional: Negative for fever.  HENT: Negative for ear pain.   Respiratory: Negative for choking, chest tightness and stridor.   Cardiovascular: Negative for chest pain and leg swelling.  Musculoskeletal: Positive for back pain and gait problem (some L hip pain). Negative for myalgias.  Skin: Negative for wound.  Neurological: Positive for numbness. Negative for seizures, syncope and headaches.  Psychiatric/Behavioral: Positive for dysphoric mood. Negative for suicidal ideas, behavioral problems, sleep disturbance, self-injury,  decreased concentration and agitation. The patient is nervous/anxious.        Objective:   Physical Exam  Constitutional: She appears well-developed and well-nourished. No distress.  HENT:  Head: Normocephalic.  Right Ear: External ear normal.  Left Ear: External ear normal.  Nose: Nose normal.  Mouth/Throat: Oropharynx is clear and moist.  Eyes: Conjunctivae are normal. Pupils are equal, round, and reactive to light. Right eye exhibits no discharge. Left eye exhibits no discharge.  Neck: Normal range of motion. Neck supple. No JVD present. No tracheal deviation present. No thyromegaly present.  Cardiovascular: Normal rate, regular rhythm and normal heart sounds.   Pulmonary/Chest: No stridor. No respiratory distress. She has no wheezes.  Abdominal: Soft. Bowel sounds are normal. She exhibits no distension and no mass. There is no tenderness. There is no rebound and no guarding.  Musculoskeletal: She exhibits tenderness. She exhibits no edema.  LS is tender Strait leg elev is pos on L L troch major is tender to palp  Lymphadenopathy:    She has no cervical adenopathy.  Neurological: She displays normal reflexes. No cranial nerve deficit. She exhibits normal muscle tone. Coordination normal.  Skin: No rash noted. No erythema.  Psychiatric: Her behavior is normal. Judgment and thought content normal.  tearful   B heels are not tender to palp L LS and L SI is tender SI joints are tender B     Lab Results  Component Value Date   WBC 7.1 12/08/2012   HGB 14.2  12/08/2012   HCT 44.9 12/08/2012   PLT 262.0 07/01/2011   GLUCOSE 88 01/11/2012   ALT 19 01/25/2013   AST 21 01/25/2013   NA 137 01/11/2012   K 4.5 01/11/2012   CL 103 01/11/2012   CREATININE 0.82 01/11/2012   BUN 11 01/11/2012   CO2 25 01/11/2012   TSH 3.04 01/25/2013    LS MRI    Assessment & Plan:

## 2013-03-27 NOTE — Assessment & Plan Note (Addendum)
Zynia seems to have issues with SI joints as well at present at present Sports med Consult - ?injections Start Butrans patch

## 2013-03-28 ENCOUNTER — Encounter: Payer: Self-pay | Admitting: Internal Medicine

## 2013-03-28 NOTE — Assessment & Plan Note (Signed)
Doing fair 

## 2013-03-28 NOTE — Assessment & Plan Note (Signed)
Continue with current prescription therapy as reflected on the Med list.  

## 2013-03-30 ENCOUNTER — Ambulatory Visit: Payer: BC Managed Care – PPO | Admitting: Family Medicine

## 2013-04-10 ENCOUNTER — Other Ambulatory Visit: Payer: Self-pay | Admitting: Internal Medicine

## 2013-04-11 ENCOUNTER — Ambulatory Visit (INDEPENDENT_AMBULATORY_CARE_PROVIDER_SITE_OTHER): Payer: BC Managed Care – PPO | Admitting: Family Medicine

## 2013-04-11 ENCOUNTER — Encounter: Payer: Self-pay | Admitting: Family Medicine

## 2013-04-11 VITALS — BP 122/80 | HR 78 | Wt 171.0 lb

## 2013-04-11 DIAGNOSIS — M79605 Pain in left leg: Secondary | ICD-10-CM

## 2013-04-11 DIAGNOSIS — M999 Biomechanical lesion, unspecified: Secondary | ICD-10-CM | POA: Insufficient documentation

## 2013-04-11 DIAGNOSIS — M545 Low back pain, unspecified: Secondary | ICD-10-CM

## 2013-04-11 DIAGNOSIS — M9981 Other biomechanical lesions of cervical region: Secondary | ICD-10-CM

## 2013-04-11 DIAGNOSIS — M533 Sacrococcygeal disorders, not elsewhere classified: Secondary | ICD-10-CM

## 2013-04-11 NOTE — Patient Instructions (Addendum)
Very nice to meet you.   Sacroiliac Joint Mobilization and Rehab 1. Work on pretzel stretching, shoulder back and leg draped in front. 3-5 sets, 30 sec.. 2. hip abductor rotations. standing, hip flexion and rotation outward then inward. 3 sets, 15 reps. when can do comfortably, add ankle weights starting at 2 pounds.  3. cross over stretching - shoulder back to ground, same side leg crossover. 3-5 sets for 30 min..  4. rolling up and back knees to chest and rocking. 5. sacral tilt - 5 sets, hold for 5-10 seconds  Try slao the exercises i am giving you in the handout.   You did have injection today I hope this helps.  Call tomorrow and just tell me how you are doing  Ibuprofen 800 mg. Up to 3 times a day  Come back 3 weeks.

## 2013-04-11 NOTE — Assessment & Plan Note (Signed)
Decision today to treat with OMT was based on Physical Exam  After verbal consent patient was treated with HVLA and ME techniques in cervical, thoracic and sacral areas  Patient tolerated the procedure well with improvement in symptoms  Patient given exercises, stretches and lifestyle modifications  See medications in patient instructions if given  Patient will follow up in 3 weeks

## 2013-04-11 NOTE — Assessment & Plan Note (Signed)
Patient does have significant increased hypermobility of the left superior SI joint. I do think likely patient had ligamentous injury. Injected today with significant improvement. Patient given home exercises to try to strengthen hip abductors as well as core strengthening. Patient responded very well to osteopathic manipulation Patient and will come back again in 3 weeks for further evaluation. If she continues to have pain patient did state that it seemed to be worse around her menstruation. I would consider endometriosis in the differential diagnosis. If this is a possibility we may do a trial of Depo Provera and see if we get improvement.

## 2013-04-11 NOTE — Assessment & Plan Note (Signed)
Decision today to treat with OMT was based on Physical Exam  After verbal consent patient was treated with HVLA and ME techniques in cervical, thoracic and sacral areas  Patient tolerated the procedure well with improvement in symptoms  Patient given exercises, stretches and lifestyle modifications  See medications in patient instructions if given  Patient will follow up in 3 weeks  

## 2013-04-11 NOTE — Progress Notes (Signed)
I'm seeing this patient by the request  of:  Dr. Posey Rea  CC: Low back pain  HPI: Patient is a very pleasant 37 year old female who is coming in for evaluation of chronic low back pain.  Patient has had this back pain intermittently over the course last 3 years. Patient's R. November any true specific injury. Patient has tried multiple different medications including topical patches, Valium, Wellbutrin, Lexapro, Neurontin, Norco, nabumetone, and tramadol. Patient states all of them seem to work very minimally and only for a small amount of time. Patient states that his pain is a dull ache they can have a sharp sensation with certain movements. Patient states that he she can have radiation down the left aspect of her leg. Patient has had this pain has had multiple MRIs which I did review today. Patient is the severity of 7/10. Also some time certain stretches seem to be helpful.   Past medical, surgical, family and social history reviewed. Medications reviewed all in the electronic medical record.   Review of Systems: No headache, visual changes, nausea, vomiting, diarrhea, constipation, dizziness, abdominal pain, skin rash, fevers, chills, night sweats, weight loss, swollen lymph nodes, body aches, joint swelling, muscle aches, chest pain, shortness of breath, mood changes.   Objective:    Weight 171 lb (77.565 kg).   General: No apparent distress alert and oriented x3 mood and affect normal, dressed appropriately.  HEENT: Pupils equal, extraocular movements intact Respiratory: Patient's speak in full sentences and does not appear short of breath Cardiovascular: No lower extremity edema, non tender, no erythema Skin: Warm dry intact with no signs of infection or rash on extremities or on axial skeleton. Abdomen: Soft nontender Neuro: Cranial nerves II through XII are intact, neurovascularly intact in all extremities with 2+ DTRs and 2+ pulses. Lymph: No lymphadenopathy of posterior or  anterior cervical chain or axillae bilaterally.  MSK: Non tender with full range of motion and good stability and symmetric strength and tone of shoulders, elbows, wrist, hip, knee and ankles bilaterally.  Back Exam:  Inspection: Unremarkable  Motion: Flexion 45 deg, Extension 45 deg, Side Bending to 45 deg bilaterally,  Rotation to 45 deg bilaterally  SLR laying: Negative  XSLR laying: Negative  Palpable tenderness: None. FABER: Positive on left Sensory change: Gross sensation intact to all lumbar and sacral dermatomes.  Reflexes: 2+ at both patellar tendons, 2+ at achilles tendons, Babinski's downgoing.  Strength at foot  Plantar-flexion: 5/5 Dorsi-flexion: 5/5 Eversion: 5/5 Inversion: 5/5  Leg strength  Quad: 5/5 Hamstring: 5/5 Hip flexor: 5/5 Hip abductors: 3/5 on left compared to 5 out of 5 on right side Gait external rotation of the left leg but otherwise unremarkable.   OMT Physical Exam  Standing flexion left  Seated Flexion left  Cervical C2 extended rotated inside that right C4 flexed rotated and side bent left  Thoracic T5 extended rotated and side bent  Lumbar Neutral mild increased lordosis  Sacrum Left on left severely tender over the left superior SI joint     Previous MRI was reviewed by me. MRI of patient's lumbar spine dated June 27, 2012 shows the patient does have a moderate broad-based disc osteophyte complex in the L5-S1. There was foraminal extension. When I look at these images there is a questionable impression on the exiting L5 nerve root.  After verbal consent patient was prepped with 2 alcohol swabs over the SI joint. With the aid of ultrasound patient did have a 22-gauge 1-1/2 inch needle injected into  the left SI joint. Patient did have 2 cc of 1% Marcaine and 1 cc of Kenalog 40. Patient tolerated the procedure well with no blood loss. Patient did have significant improvement in pain immediately. Patient did have Band-Aid placed. Post  injection instructions given.   Impression and Recommendations:     This case required medical decision making of moderate complexity.

## 2013-04-11 NOTE — Assessment & Plan Note (Signed)
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 When reviewing though patient did have what appeared to be an L5 left side mild nerve root impingement. If this continues we would consider another epidural injection. Due to patient's insurance and high deductible bow patient would like to avoid this as much as possible. We'll see if she continues to respond well to osteopathic manipulation.

## 2013-04-12 ENCOUNTER — Other Ambulatory Visit: Payer: Self-pay | Admitting: Internal Medicine

## 2013-04-12 ENCOUNTER — Telehealth: Payer: Self-pay | Admitting: *Deleted

## 2013-04-12 NOTE — Telephone Encounter (Signed)
We renewed Tramadol Pls give more time for the inj to work [employer]

## 2013-04-12 NOTE — Telephone Encounter (Signed)
Spoke with pt advised of MDs message 

## 2013-04-12 NOTE — Telephone Encounter (Signed)
Pt called states injection is not effective.  Further states she is still experiencing pain.  please advise

## 2013-05-10 ENCOUNTER — Telehealth: Payer: Self-pay | Admitting: *Deleted

## 2013-05-10 NOTE — Telephone Encounter (Signed)
Pt called states back pain continued after injection.  Pt requests whats her next plan.  plesae advise

## 2013-05-11 NOTE — Telephone Encounter (Signed)
Dr Katrinka Blazing asked [name] to come for a follow up in 3 wks after her appt w/him - pls schedule f/u w/Dr Zackery Barefoot

## 2013-05-23 ENCOUNTER — Other Ambulatory Visit (INDEPENDENT_AMBULATORY_CARE_PROVIDER_SITE_OTHER): Payer: BC Managed Care – PPO

## 2013-05-23 ENCOUNTER — Encounter: Payer: Self-pay | Admitting: Family Medicine

## 2013-05-23 ENCOUNTER — Ambulatory Visit (INDEPENDENT_AMBULATORY_CARE_PROVIDER_SITE_OTHER)
Admission: RE | Admit: 2013-05-23 | Discharge: 2013-05-23 | Disposition: A | Discharge status: Home / Self Care | Payer: BC Managed Care – PPO | Source: Ambulatory Visit | Attending: Family Medicine | Admitting: Family Medicine

## 2013-05-23 ENCOUNTER — Ambulatory Visit (INDEPENDENT_AMBULATORY_CARE_PROVIDER_SITE_OTHER): Payer: BC Managed Care – PPO | Admitting: Family Medicine

## 2013-05-23 VITALS — BP 114/82 | HR 94

## 2013-05-23 DIAGNOSIS — M25552 Pain in left hip: Secondary | ICD-10-CM

## 2013-05-23 DIAGNOSIS — M545 Low back pain, unspecified: Secondary | ICD-10-CM

## 2013-05-23 DIAGNOSIS — M25559 Pain in unspecified hip: Secondary | ICD-10-CM

## 2013-05-23 DIAGNOSIS — M79605 Pain in left leg: Secondary | ICD-10-CM

## 2013-05-23 MED ORDER — METHYLPREDNISOLONE ACETATE 80 MG/ML IJ SUSP
80.0000 mg | Freq: Once | INTRAMUSCULAR | Status: AC
  Administered 2013-05-23: 80 mg via INTRAMUSCULAR

## 2013-05-23 MED ORDER — KETOROLAC TROMETHAMINE 60 MG/2ML IM SOLN
60.0000 mg | Freq: Once | INTRAMUSCULAR | Status: AC
Start: 2013-05-23 — End: 2013-05-23
  Administered 2013-05-23: 60 mg via INTRAMUSCULAR

## 2013-05-23 NOTE — Assessment & Plan Note (Signed)
Discussed with patient at great length. 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 The patient is having worsening back pain with radiculopathy make any concern for potential nerve root impingement. Patient is unable to pinpoint where the radiation was occurring which makes it difficult to say what level is affected. 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 I would believe that it is one of these levels. Patient's disease process in symptoms I would concern for more of foraminal narrowing causing L5 nerve root impingement. Toradol and dexamethasone was given today. X-rays ordered of back to see if there is any significant changes. If we do not see any significant changes in patient is still in considerable pain and 24-48 hours I would send her degrees broaching for a interforaminal injection of L4-L5 left side. There is also question that this could be potentially referred pain from her hip. We could consider doing a diagnostic intra-articular hip injection to rule out hip pathology.

## 2013-05-23 NOTE — Progress Notes (Signed)
  CC: Low back pain  HPI: Patient is here following up for low back pain. Patient was diagnosed with more of an SI joint dysfunction and did have a intra-articular SI injection as well as manipulation therapy. Patient states that unfortunately this did not make any significant improvement. Patient states over the course of time she is also started to develop more radiation down the leg. Patient is finding it very difficult to even do regular activities of daily living such as bending down to pick things up secondary to her back pain. Patient states certain movements does cause radiation to go down her leg but she states it's generalized and cannot pinpoint how far or what side of the leg it is on. Patient denies any weakness though. Patient states it is very difficult to sleep at night secondary to pain. Patient is also found it very difficult to walk putting any pressure on the left leg seems to make it worse.  Past medical, surgical, family and social history reviewed. Medications reviewed all in the electronic medical record.   Review of Systems: No headache, visual changes, nausea, vomiting, diarrhea, constipation, dizziness, abdominal pain, skin rash, fevers, chills, night sweats, weight loss, swollen lymph nodes, body aches, joint swelling, muscle aches, chest pain, shortness of breath, mood changes.   Objective:    Blood pressure 114/82, pulse 94, last menstrual period 05/13/2013, SpO2 96.00%.   General: No apparent distress alert and oriented x3 mood and affect normal, dressed appropriately.  HEENT: Pupils equal, extraocular movements intact Respiratory: Patient's speak in full sentences and does not appear short of breath Cardiovascular: No lower extremity edema, non tender, no erythema Skin: Warm dry intact with no signs of infection or rash on extremities or on axial skeleton. Abdomen: Soft nontender Neuro: Cranial nerves II through XII are intact, neurovascularly intact in all  extremities with 2+ DTRs and 2+ pulses. Lymph: No lymphadenopathy of posterior or anterior cervical chain or axillae bilaterally.  MSK: Non tender with full range of motion and good stability and symmetric strength and tone of shoulders, elbows, wrist, hip, knee and ankles bilaterally.  Back Exam:  Inspection: Unremarkable  Motion: Flexion 45 deg, Extension 45 deg, Side Bending to 45 deg bilaterally,  Rotation to 45 deg bilaterally  SLR laying: Positive on left  XSLR laying: Positive Palpable tenderness: Left paraspinal musculature and left SI joint. FABER: Positive on left Sensory change: Gross sensation intact to all lumbar and sacral dermatomes.  Reflexes: 2+ at both patellar tendons, 2+ at achilles tendons, Babinski's downgoing.  Strength at foot  Plantar-flexion: 5/5 Dorsi-flexion: 5/5 Eversion: 5/5 Inversion: 5/5  Leg strength  Quad: 5/5 Hamstring: 5/5 Hip flexor: 5/5 Hip abductors: 3/5 on left compared to 5 out of 5 on right side Gait external rotation of the left leg but otherwise unremarkable. Patient does have extreme pain with internal rotation of the hip. This seems to exacerbate her back pain.  Musculoskeletal ultrasound of the left hip was performed and interpreted by Antoine Primas, M  Ultrasound shows the patient does have a small effusion of the left hip. There appears to be a potential bony spur or questionable calcific change of the labrum on the anterior inferior portion. No other bony abnormality noted.  Impression: Questionable labral tear first osteophyte from the anterior hip with small effusion   Impression and Recommendations:     This case required medical decision making of moderate complexity.

## 2013-05-23 NOTE — Patient Instructions (Signed)
Gopd to see you Try these injections Try doing half the exercises one day and half the next day.  Start though in 48hours.  We will get xrays and I will call you about the next step. If it shows any changes then we should consider try a foraminal injection of L4-L5 which was seen on your previous MRI.  If no changes we may want to consider looking at your hip more closely.  Make an appointment in 2 weeks if you want to try manipulation.  Or at least give me a call.

## 2013-05-29 ENCOUNTER — Telehealth: Payer: Self-pay | Admitting: Internal Medicine

## 2013-05-30 ENCOUNTER — Telehealth: Payer: Self-pay | Admitting: Family Medicine

## 2013-05-30 DIAGNOSIS — M79605 Pain in left leg: Secondary | ICD-10-CM

## 2013-05-30 DIAGNOSIS — M545 Low back pain: Secondary | ICD-10-CM

## 2013-05-30 NOTE — Telephone Encounter (Signed)
05/30/2013  Pt called to tell Dr. Katrinka Blazing that the injections she received last week are not working.  Pt stated she is wearing a back brace to help with the pain.  Please advise.  Pts work # [phone], please contact pt at this #

## 2013-05-30 NOTE — Telephone Encounter (Signed)
Called patient back at her office.  Discussed with her on next process. Will send her for a intraforaminal injection of L4-L5 and L5 S1, will return 1 week after injections.

## 2013-05-31 ENCOUNTER — Other Ambulatory Visit: Payer: Self-pay

## 2013-05-31 ENCOUNTER — Encounter: Payer: Self-pay | Admitting: Internal Medicine

## 2013-06-05 ENCOUNTER — Encounter: Payer: Self-pay | Admitting: Internal Medicine

## 2013-06-05 ENCOUNTER — Other Ambulatory Visit: Payer: Self-pay | Admitting: Internal Medicine

## 2013-06-05 MED ORDER — BUPRENORPHINE 15 MCG/HR TD PTWK: 1.0000 | MEDICATED_PATCH | TRANSDERMAL | Status: DC

## 2013-06-07 ENCOUNTER — Other Ambulatory Visit: Payer: BC Managed Care – PPO

## 2013-06-07 ENCOUNTER — Ambulatory Visit
Admission: RE | Admit: 2013-06-07 | Discharge: 2013-06-07 | Disposition: A | Discharge status: Home / Self Care | Payer: BC Managed Care – PPO | Source: Ambulatory Visit | Attending: Family Medicine | Admitting: Family Medicine

## 2013-06-07 ENCOUNTER — Other Ambulatory Visit: Payer: Self-pay | Admitting: *Deleted

## 2013-06-07 ENCOUNTER — Other Ambulatory Visit: Payer: Self-pay | Admitting: Family Medicine

## 2013-06-07 DIAGNOSIS — M545 Low back pain, unspecified: Secondary | ICD-10-CM

## 2013-06-07 MED ORDER — METHYLPREDNISOLONE ACETATE 40 MG/ML INJ SUSP (RADIOLOG
120.0000 mg | Freq: Once | INTRAMUSCULAR | Status: AC
  Administered 2013-06-07: 120 mg via EPIDURAL

## 2013-06-07 MED ORDER — IOHEXOL 180 MG/ML  SOLN
1.0000 mL | Freq: Once | INTRAMUSCULAR | Status: AC | PRN
  Administered 2013-06-07: 1 mL via EPIDURAL

## 2013-06-10 ENCOUNTER — Encounter: Payer: Self-pay | Admitting: Family Medicine

## 2013-06-12 ENCOUNTER — Encounter: Payer: Self-pay | Admitting: Internal Medicine

## 2013-06-13 NOTE — Telephone Encounter (Signed)
[name], ok to fill early Thx

## 2013-06-18 ENCOUNTER — Encounter: Payer: Self-pay | Admitting: Internal Medicine

## 2013-06-19 NOTE — Telephone Encounter (Signed)
[name], please, refill Tramadol (w/0 refills) Thx

## 2013-06-20 ENCOUNTER — Other Ambulatory Visit: Payer: Self-pay | Admitting: *Deleted

## 2013-06-20 MED ORDER — TRAMADOL HCL 50 MG PO TABS: 50.0000 mg | ORAL_TABLET | Freq: Two times a day (BID) | ORAL | Status: DC | PRN

## 2013-07-03 ENCOUNTER — Ambulatory Visit (INDEPENDENT_AMBULATORY_CARE_PROVIDER_SITE_OTHER): Payer: BC Managed Care – PPO | Admitting: Internal Medicine

## 2013-07-03 ENCOUNTER — Encounter: Payer: Self-pay | Admitting: Internal Medicine

## 2013-07-03 VITALS — BP 138/80 | HR 80 | Temp 99.3°F | Resp 16 | Wt 181.0 lb

## 2013-07-03 DIAGNOSIS — L729 Follicular cyst of the skin and subcutaneous tissue, unspecified: Secondary | ICD-10-CM | POA: Insufficient documentation

## 2013-07-03 DIAGNOSIS — L723 Sebaceous cyst: Secondary | ICD-10-CM

## 2013-07-03 MED ORDER — DOXYCYCLINE HYCLATE 100 MG PO TABS: 100.0000 mg | ORAL_TABLET | Freq: Two times a day (BID) | ORAL | Status: DC

## 2013-07-03 NOTE — Progress Notes (Signed)
Pre visit review using our clinic review tool, if applicable. No additional management support is needed unless otherwise documented below in the visit note. 

## 2013-07-03 NOTE — Assessment & Plan Note (Signed)
There is an oval mobile 5x7 mm sq cyst on L temporal-occipital scalp; tender a little. No redness  Doxy x 10d If worse - will I&D

## 2013-07-03 NOTE — Progress Notes (Signed)
Subjective:     C/o a sore spot on the scalp x 2-3 d. Her dtr is sick w/a URI and ear infection  Back Pain This is a chronic problem. The current episode started more than 1 year ago. The problem occurs constantly. The problem has been gradually improving since onset. The pain is present in the lumbar spine and gluteal. The quality of the pain is described as aching and shooting. The pain radiates to the left thigh. The pain is at a severity of 4/10. The pain is moderate. The pain is worse during the day. The symptoms are aggravated by standing. Associated symptoms include numbness. Pertinent negatives include no chest pain, fever or headaches. Risk factors include obesity. The treatment provided moderate relief.  LBP is worse w/her periods  F/u depression.  She failed a drug test w/Dr Benjamine Mola - ETOH and amphet (+) - Per pt; she took Vyvance in error in place of Gabapentin 11/13. PT was advised - too $$$   Wt Readings from Last 3 Encounters:  07/03/13 181 lb (82.101 kg)  04/11/13 171 lb (77.565 kg)  03/27/13 171 lb 8 oz (77.792 kg)   BP Readings from Last 3 Encounters:  07/03/13 138/80  06/07/13 129/79  05/23/13 114/82      Past Medical History  Diagnosis Date  . LBP (low back pain) 2011  . Vitamin B12 deficiency 2012  . Vitamin D deficiency 2012  . Depression      Review of Systems  Constitutional: Negative for fever.  HENT: Negative for ear pain.   Respiratory: Negative for choking, chest tightness and stridor.   Cardiovascular: Negative for chest pain and leg swelling.  Musculoskeletal: Positive for back pain and gait problem (some L hip pain). Negative for myalgias.  Skin: Negative for wound.  Neurological: Positive for numbness. Negative for seizures, syncope and headaches.  Psychiatric/Behavioral: Positive for dysphoric mood. Negative for suicidal ideas, behavioral problems, sleep disturbance, self-injury, decreased concentration and agitation. The patient is  nervous/anxious.        Objective:   Physical Exam  Constitutional: She appears well-developed and well-nourished. No distress.  HENT:  Head: Normocephalic.  Right Ear: External ear normal.  Left Ear: External ear normal.  Nose: Nose normal.  Mouth/Throat: Oropharynx is clear and moist.  Eyes: Conjunctivae are normal. Pupils are equal, round, and reactive to light. Right eye exhibits no discharge. Left eye exhibits no discharge.  Neck: Normal range of motion. Neck supple. No JVD present. No tracheal deviation present. No thyromegaly present.  Cardiovascular: Normal rate, regular rhythm and normal heart sounds.   Pulmonary/Chest: No stridor. No respiratory distress. She has no wheezes.  Abdominal: Soft. Bowel sounds are normal. She exhibits no distension and no mass. There is no tenderness. There is no rebound and no guarding.  Musculoskeletal: She exhibits tenderness. She exhibits no edema.  LS is tender Strait leg elev is pos on L L troch major is tender to palp  Lymphadenopathy:    She has no cervical adenopathy.  Neurological: She displays normal reflexes. No cranial nerve deficit. She exhibits normal muscle tone. Coordination normal.  Skin: No rash noted. No erythema.  Psychiatric: Her behavior is normal. Judgment and thought content normal.  tearful   B heels are not tender to palp L LS and L SI is tender SI joints are tender B  There is an oval mobile 5x7 mm sq cyst on L temporal-occipital scalp; tender a little. No redness     Lab Results  Component Value Date   WBC 7.1 12/08/2012   HGB 14.2 12/08/2012   HCT 44.9 12/08/2012   PLT 262.0 07/01/2011   GLUCOSE 88 01/11/2012   ALT 19 01/25/2013   AST 21 01/25/2013   NA 137 01/11/2012   K 4.5 01/11/2012   CL 103 01/11/2012   CREATININE 0.82 01/11/2012   BUN 11 01/11/2012   CO2 25 01/11/2012   TSH 3.04 01/25/2013    LS MRI    Assessment & Plan:

## 2013-07-06 ENCOUNTER — Encounter: Payer: Self-pay | Admitting: Family Medicine

## 2013-07-12 ENCOUNTER — Encounter: Payer: Self-pay | Admitting: Family Medicine

## 2013-07-15 ENCOUNTER — Other Ambulatory Visit: Payer: Self-pay | Admitting: Internal Medicine

## 2013-07-18 ENCOUNTER — Other Ambulatory Visit: Payer: Self-pay | Admitting: Internal Medicine

## 2013-07-20 ENCOUNTER — Telehealth: Payer: Self-pay | Admitting: Internal Medicine

## 2013-07-24 MED ORDER — BUPRENORPHINE 15 MCG/HR TD PTWK: 1.0000 | MEDICATED_PATCH | TRANSDERMAL | Status: DC

## 2013-07-24 MED ORDER — TRAMADOL HCL 50 MG PO TABS: 50.0000 mg | ORAL_TABLET | Freq: Two times a day (BID) | ORAL | Status: DC | PRN

## 2013-07-24 MED ORDER — DIAZEPAM 5 MG PO TABS: 5.0000 mg | ORAL_TABLET | Freq: Every evening | ORAL | Status: DC | PRN

## 2013-07-24 NOTE — Telephone Encounter (Signed)
Done

## 2013-07-24 NOTE — Telephone Encounter (Signed)
[name], please, refill Butrans; disregard Valium Rx Thx

## 2013-07-24 NOTE — Telephone Encounter (Signed)
Will fax.

## 2013-07-24 NOTE — Telephone Encounter (Signed)
Per MD Butrans phoned in to pharmacy. Tramadol Rx faxed.

## 2013-07-26 LAB — HM PAP SMEAR: HM PAP: NORMAL

## 2013-08-07 ENCOUNTER — Other Ambulatory Visit: Payer: Self-pay | Admitting: Obstetrics and Gynecology

## 2013-08-07 DIAGNOSIS — R928 Other abnormal and inconclusive findings on diagnostic imaging of breast: Secondary | ICD-10-CM

## 2013-09-14 ENCOUNTER — Other Ambulatory Visit: Payer: Self-pay | Admitting: Internal Medicine

## 2013-09-14 ENCOUNTER — Encounter: Payer: Self-pay | Admitting: Internal Medicine

## 2013-09-19 NOTE — Telephone Encounter (Signed)
Patient is calling to check the status of her refill request for Tramadol. Please advise.

## 2013-09-21 ENCOUNTER — Encounter: Payer: Self-pay | Admitting: Internal Medicine

## 2013-09-21 NOTE — Telephone Encounter (Signed)
Needs to see Alisha Thx

## 2013-10-01 ENCOUNTER — Ambulatory Visit (INDEPENDENT_AMBULATORY_CARE_PROVIDER_SITE_OTHER): Payer: BC Managed Care – PPO | Admitting: Internal Medicine

## 2013-10-01 ENCOUNTER — Encounter: Payer: Self-pay | Admitting: Internal Medicine

## 2013-10-01 VITALS — BP 132/102 | HR 72 | Temp 98.7°F | Resp 16 | Wt 179.0 lb

## 2013-10-01 DIAGNOSIS — N62 Hypertrophy of breast: Secondary | ICD-10-CM | POA: Insufficient documentation

## 2013-10-01 DIAGNOSIS — M545 Low back pain, unspecified: Secondary | ICD-10-CM

## 2013-10-01 DIAGNOSIS — M79605 Pain in left leg: Secondary | ICD-10-CM

## 2013-10-01 MED ORDER — ESCITALOPRAM OXALATE 20 MG PO TABS: 20.0000 mg | ORAL_TABLET | Freq: Every day | ORAL | Status: DC

## 2013-10-01 MED ORDER — PREGABALIN 75 MG PO CAPS: 75.0000 mg | ORAL_CAPSULE | Freq: Two times a day (BID) | ORAL | Status: DC

## 2013-10-01 MED ORDER — BUPROPION HCL ER (XL) 300 MG PO TB24: 300.0000 mg | ORAL_TABLET | Freq: Every day | ORAL | Status: DC

## 2013-10-01 MED ORDER — BUPRENORPHINE 15 MCG/HR TD PTWK: 1.0000 | MEDICATED_PATCH | TRANSDERMAL | Status: DC

## 2013-10-01 NOTE — Assessment & Plan Note (Addendum)
L sciatica is worse DDD breasts are contributing - breast reduction should be helpful

## 2013-10-01 NOTE — Progress Notes (Signed)
Subjective:     C/o more pain in the back and L leg. C/o large breasts aggravating LBP   Back Pain This is a chronic problem. The current episode started more than 1 year ago. The problem occurs constantly. The problem has been gradually improving since onset. The pain is present in the lumbar spine and gluteal. The quality of the pain is described as aching and shooting. The pain radiates to the left thigh. The pain is at a severity of 4/10. The pain is moderate. The pain is worse during the day. The symptoms are aggravated by standing. Associated symptoms include numbness. Pertinent negatives include no chest pain, fever or headaches. Risk factors include obesity. The treatment provided moderate relief.  LBP is worse - LLE pain  F/u depression.  She failed a drug test w/Dr Benjamine Mola - ETOH and amphet (+) - Per pt; she took Vyvance in error in place of Gabapentin 11/13. PT was advised - too $$$   Wt Readings from Last 3 Encounters:  10/01/13 179 lb (81.194 kg)  07/03/13 181 lb (82.101 kg)  04/11/13 171 lb (77.565 kg)   BP Readings from Last 3 Encounters:  10/01/13 132/102  07/03/13 138/80  06/07/13 129/79      Past Medical History  Diagnosis Date  . LBP (low back pain) 2011  . Vitamin B12 deficiency 2012  . Vitamin D deficiency 2012  . Depression      Review of Systems  Constitutional: Negative for fever.  HENT: Negative for ear pain.   Respiratory: Negative for choking, chest tightness and stridor.   Cardiovascular: Negative for chest pain and leg swelling.  Musculoskeletal: Positive for back pain and gait problem (some L hip pain). Negative for myalgias.  Skin: Negative for wound.  Neurological: Positive for numbness. Negative for seizures, syncope and headaches.  Psychiatric/Behavioral: Positive for dysphoric mood. Negative for suicidal ideas, behavioral problems, sleep disturbance, self-injury, decreased concentration and agitation. The patient is  nervous/anxious.        Objective:   Physical Exam  Constitutional: She appears well-developed and well-nourished. No distress.  HENT:  Head: Normocephalic.  Right Ear: External ear normal.  Left Ear: External ear normal.  Nose: Nose normal.  Mouth/Throat: Oropharynx is clear and moist.  Eyes: Conjunctivae are normal. Pupils are equal, round, and reactive to light. Right eye exhibits no discharge. Left eye exhibits no discharge.  Neck: Normal range of motion. Neck supple. No JVD present. No tracheal deviation present. No thyromegaly present.  Cardiovascular: Normal rate, regular rhythm and normal heart sounds.   Pulmonary/Chest: No stridor. No respiratory distress. She has no wheezes.  Abdominal: Soft. Bowel sounds are normal. She exhibits no distension and no mass. There is no tenderness. There is no rebound and no guarding.  Musculoskeletal: She exhibits tenderness. She exhibits no edema.  LS is tender Strait leg elev is pos on L L troch major is tender to palp  Lymphadenopathy:    She has no cervical adenopathy.  Neurological: She displays normal reflexes. No cranial nerve deficit. She exhibits normal muscle tone. Coordination normal.  Skin: No rash noted. No erythema.  Psychiatric: Her behavior is normal. Judgment and thought content normal.  tearful   B heels are not tender to palp L LS and L SI is tender SI joints are tender B     Lab Results  Component Value Date   WBC 7.1 12/08/2012   HGB 14.2 12/08/2012   HCT 44.9 12/08/2012   PLT 262.0 07/01/2011  GLUCOSE 88 01/11/2012   ALT 19 01/25/2013   AST 21 01/25/2013   NA 137 01/11/2012   K 4.5 01/11/2012   CL 103 01/11/2012   CREATININE 0.82 01/11/2012   BUN 11 01/11/2012   CO2 25 01/11/2012   TSH 3.04 01/25/2013    LS MRI  A complex case    Assessment & Plan:

## 2013-10-01 NOTE — Assessment & Plan Note (Signed)
Continue with current prescription therapy as reflected on the Med list.  

## 2013-10-01 NOTE — Assessment & Plan Note (Signed)
DDD breasts are contributing to LBP Platic surg ref Dr Izora Ribasoley

## 2013-10-16 ENCOUNTER — Ambulatory Visit
Admission: RE | Admit: 2013-10-16 | Discharge: 2013-10-16 | Disposition: A | Discharge status: Home / Self Care | Payer: BC Managed Care – PPO | Source: Ambulatory Visit | Attending: Obstetrics and Gynecology | Admitting: Obstetrics and Gynecology

## 2013-10-16 DIAGNOSIS — R928 Other abnormal and inconclusive findings on diagnostic imaging of breast: Secondary | ICD-10-CM

## 2013-10-21 ENCOUNTER — Telehealth: Payer: Self-pay | Admitting: Internal Medicine

## 2013-10-22 NOTE — Telephone Encounter (Signed)
Phone call from patient [phone] stating she would prefer just to have the refill on Tramadol instead of Lyrica since the Lyrica is more expensive. Please advise.

## 2013-10-23 MED ORDER — TRAMADOL HCL 50 MG PO TABS: ORAL_TABLET | ORAL | Status: DC

## 2013-10-23 NOTE — Telephone Encounter (Signed)
[facility] [phone]

## 2013-10-23 NOTE — Telephone Encounter (Signed)
Ok Tramadol Thx

## 2013-11-07 ENCOUNTER — Other Ambulatory Visit: Payer: Self-pay | Admitting: *Deleted

## 2013-11-07 MED ORDER — NABUMETONE 500 MG PO TABS: 500.0000 mg | ORAL_TABLET | Freq: Two times a day (BID) | ORAL | Status: DC

## 2013-11-14 ENCOUNTER — Telehealth: Payer: Self-pay | Admitting: Internal Medicine

## 2013-11-14 NOTE — Telephone Encounter (Signed)
Patient currently has swelling in both legs and pain is very bad. What patient currently has prescribed is not helping for pain.  Please advice patient.

## 2013-11-16 NOTE — Telephone Encounter (Signed)
Yes, Lyrica may cause swelling  - ok to decrease dose or to stop it. Use Tramadol and Nabumeton for pain. We can go up on Butrans patch dose to 20 mg  Thx

## 2013-11-16 NOTE — Telephone Encounter (Signed)
Pt states she will also call neurology to see if they will give her pain medicine.

## 2013-11-16 NOTE — Telephone Encounter (Signed)
Pt called back to see why no one has called her.  She has swelling in L. Leg.  She is on lyrica. Could this cause the swelling.  She didn't sign the waiver for the neurologist for pain medicine.  Could she have something for pain until she gets scheduled for surgery.  She is on a patch.  She had to take off the patch to have the MRI.  Please call her.

## 2013-11-16 NOTE — Telephone Encounter (Signed)
I can suggest  Neurology consultation. Is it ok to schedule? Thx

## 2013-11-16 NOTE — Telephone Encounter (Signed)
She states she has an upcoming appt but She was wanting something for pain until appt.  I can find out when appt is?

## 2013-11-19 NOTE — Telephone Encounter (Signed)
Patient states that she needs an rx for Tramadol. She just picked up rx for Butrans patch 15mg  at her pharmacy, so she will use what she has for now.

## 2013-11-20 MED ORDER — TRAMADOL HCL 50 MG PO TABS: ORAL_TABLET | ORAL | Status: DC

## 2013-11-20 NOTE — Telephone Encounter (Signed)
Ok Thx 

## 2013-11-20 NOTE — Telephone Encounter (Signed)
Call[facility] spoke with Snehal/pharmacist gave md approval.../lmb

## 2013-11-23 ENCOUNTER — Encounter: Payer: Self-pay | Admitting: Family Medicine

## 2013-12-12 ENCOUNTER — Ambulatory Visit (INDEPENDENT_AMBULATORY_CARE_PROVIDER_SITE_OTHER): Payer: BC Managed Care – PPO | Admitting: Emergency Medicine

## 2013-12-12 VITALS — BP 128/84 | HR 86 | Temp 97.7°F | Resp 16 | Ht 66.0 in | Wt 184.0 lb

## 2013-12-12 DIAGNOSIS — M549 Dorsalgia, unspecified: Secondary | ICD-10-CM

## 2013-12-12 NOTE — Progress Notes (Signed)
[facility] [address], [place] [address] [phone]  Date:  12/12/2013   Name:  [name] Peeters   DOB:  08/25/1975   MRN:  [id number]  PCP:  Sonda PrimesAlex Plotnikov, MD    Chief Complaint: Back Pain   History of Present Illness:  [name] Menefee is a 38 y.o. very pleasant female patient who presents with the following:  Patient has a very confusing story that is inconsistent.  She apparently has had low back pain over the past 6-8 years and has a small bulging of her L4-5 disc.  She has undergone an epidural injection with no improvement in her pain. Says she has severe low back pain that increases with weight bearing and ambulation.  Pain radiates down the MEDIAL thigh on occasion. No neuro symptoms.  Says she has such severe pain that she can barely turn or get out of a chair.  At times she is quite tearful.  She tells me that she refused "all" pain meds in past but is now on Butrans patch and tramadol.  Was [facility] but she failed to mention that medication.  She apparently had discussed an appt with her doctor for tomorrow but chose to come here. She says she was told by the neurosurgeon that her pain was not from her "back" but likely the hip joint and recommended an MRI that she has not undergone due to concern about cost.   Specifically denies any request for additional medication. No improvement with over the counter medications or other home remedies. Denies other complaint or health concern today.   Patient Active Problem List   Diagnosis Date Noted  . Large breasts 10/01/2013  . Scalp cyst 07/03/2013  . Sacroiliac joint dysfunction of left side 04/11/2013  . Nonallopathic lesion of cervical region 04/11/2013  . Nonallopathic lesion of lumbosacral region 04/11/2013  . Nonallopathic lesion of thoracic region 04/11/2013  . Low back pain radiating to left leg 06/05/2012  . Jaw dislocation 02/21/2012  . Plantar  fasciitis 02/09/2012  . Abdominal tenderness, epigastric 07/01/2011  . GERD (gastroesophageal reflux disease) 07/01/2011  . Preventative health care 01/21/2011  . Acute bronchitis 01/21/2011  . Wheezing 01/21/2011  . LBP (low back pain) 12/12/2010  . Paresthesia 12/12/2010  . Fatigue 12/12/2010  . Vitamin B 12 deficiency 12/12/2010  . Unspecified vitamin D deficiency 12/12/2010  . Depression 12/12/2010    Past Medical History  Diagnosis Date  . LBP (low back pain) 2011  . Vitamin B12 deficiency 2012  . Vitamin D deficiency 2012  . Depression     Past Surgical History  Procedure Laterality Date  . Cesarean section  1-08    History  Substance Use Topics  . Smoking status: Current Some Day Smoker -- 0.30 packs/day for 10 years    Types: Cigarettes  . Smokeless tobacco: Never Used  . Alcohol Use: No    Family History  Problem Relation Age of Onset  . Kidney disease Other   . Cancer Mother     No Known Allergies  Medication list has been reviewed and updated.  Current Outpatient Prescriptions on File Prior to Visit  Medication Sig Dispense Refill  . albuterol (PROVENTIL HFA;VENTOLIN HFA) 108 (90 BASE) MCG/ACT inhaler Inhale 2 puffs into the lungs every 4 (four) hours as needed for wheezing (cough, shortness of breath or wheezing.).  1 Inhaler  1  . Buprenorphine (BUTRANS) 15 MCG/HR PTWK Place 1  patch onto the skin once a week.  4 patch  2  . buPROPion (WELLBUTRIN XL) 300 MG 24 hr tablet Take 1 tablet (300 mg total) by mouth daily.  30 tablet  5  . escitalopram (LEXAPRO) 20 MG tablet Take 1 tablet (20 mg total) by mouth daily.  30 tablet  5  . nabumetone (RELAFEN) 500 MG tablet Take 1 tablet (500 mg total) by mouth 2 (two) times daily.  60 tablet  3  . traMADol (ULTRAM) 50 MG tablet TAKE 1-2 TABLETS BY MOUTH TWICE DAILY AS NEEDED FOR SEVERE PAIN  100 tablet  0   No current facility-administered medications on file prior to visit.    Review of Systems:  As per HPI,  otherwise negative.    Physical Examination: Filed Vitals:   12/12/13 1928  BP: 128/84  Pulse: 86  Temp: 97.7 F (36.5 C)  Resp: 16   Filed Vitals:   12/12/13 1928  Height: 5\' 6"  (1.676 m)  Weight: 184 lb (83.462 kg)   Body mass index is 29.71 kg/(m^2). Ideal Body Weight: Weight in (lb) to have BMI = 25: 154.6  GEN: WDWN, NAD, Non-toxic, A & O x 3 HEENT: Atraumatic, Normocephalic. Neck supple. No masses, No LAD. Ears and Nose: No external deformity. CV: RRR, No M/G/R. No JVD. No thrill. No extra heart sounds. PULM: CTA B, no wheezes, crackles, rhonchi. No retractions. No resp. distress. No accessory muscle use. ABD: S, NT, ND, +BS. No rebound. No HSM. EXTR: No c/c/e NEURO Normal gait.  PSYCH: Normally interactive. Conversant. Tearful at times, others matter of fact.  Back:  Tender across low back.  Neuro and motor intact  Assessment and Plan: Chronic low back pain Looks musculoskeletal to me will suggest ortho referral Suggested change in NSAID and muscle relaxant and she refused. She says she did well with PT and suggested a return and again she complained of cost of treatment.  She is quite frustrated.  Signed,  Phillips OdorJeffery Khalib Fendley, MD

## 2013-12-13 ENCOUNTER — Telehealth: Payer: Self-pay | Admitting: *Deleted

## 2013-12-13 NOTE — Telephone Encounter (Signed)
Pt called yesterday and left msg on triage ? Referral. Called pt back she stated she didn't know if she need to see some one. Still having issues with her back. Went to urgent care yesterday, and the md she saw was not helpful. Since pt saw Dr. Katrinka BlazingSmith concerning her back advise pt to make a f/u appt with Dr. Katrinka BlazingSmith, and if he think that she need to be referred out to someone else he could do a referral. Pt agreed she states she will call to make a appt she was at work...Raechel Chute/lmb

## 2013-12-14 ENCOUNTER — Encounter: Payer: Self-pay | Admitting: Internal Medicine

## 2013-12-14 NOTE — Telephone Encounter (Signed)
Pt called requesting an increase on her Butrans Patch.  Please advise

## 2013-12-18 ENCOUNTER — Other Ambulatory Visit: Payer: Self-pay | Admitting: Internal Medicine

## 2013-12-18 MED ORDER — BUPRENORPHINE 20 MCG/HR TD PTWK: 20.0000 ug | MEDICATED_PATCH | TRANSDERMAL | Status: DC

## 2013-12-19 ENCOUNTER — Telehealth: Payer: Self-pay | Admitting: *Deleted

## 2013-12-19 NOTE — Telephone Encounter (Signed)
[facility] Triage Call Report Triage Record Num: [id number] Operator: Lesli Albee Patient Name: [name] Call Date & Time: 12/15/2013 1:58:48PM Patient Phone: [phone] PCP: Sonda Primes Patient Gender: Female PCP Fax : [phone] Patient DOB: 03-Mar-1976 Practice Name: [facility] Reason for Call: Caller: [name]/Patient; PCP: Sonda Primes (Adults only); CB#: [phone]; Call regarding Abdominal bloating ; Pt appears "as if she is 3 months pregnant.". Pt is routinely constipated. She uses a pain patch -" Butrans " for low back pain since September 2015. She has a bm q 5 days x the past year. Pt is calling to say now that she has not had a bm in 6 days. Pt also takes Promethazine and Mirilax. She feels full. Rn triaged per constipation and advised pt to See "Provider within 72 hours for pain or feeling of pressure with bowel movements". Protocol(s) Used: Constipation Recommended Outcome per Protocol: See Provider within 72 Hours Reason for Outcome: Pain or feeling of pressure with bowel movement at least twice within last week AND not previously evaluated or not responding to provider recommended treatment Care Advice: Constipation Care Measures: - Drink 8 to 10 glasses of liquid per day, more if breastfeeding. - Drink warm water or coffee early in the morning. - Gradually increase dietary fiber (fresh fruits/vegetables, whole grain bread and cereals). - As tolerated, walk 30 minutes at a steady pace daily. - Consider nonprescription stool softeners (Colace) per label, pharmacist or provider recommendations. Stool softners are not habit forming as some stimulant laxatives may become. - Consider nonprescription bulk forming laxatives (such as Metamucil, FiberCon, Citrucel, etc.); follow package directions. - Avoid routine use of strong laxatives/enemas/suppositories unless ordered by provider. - Do not delay having bowel movement when having urge. - Keep a  routine; attempt bowel movement within half hour after a meal or after some exercise. ~ 05

## 2013-12-21 ENCOUNTER — Other Ambulatory Visit: Payer: Self-pay | Admitting: Internal Medicine

## 2013-12-28 ENCOUNTER — Other Ambulatory Visit: Payer: Self-pay | Admitting: Internal Medicine

## 2014-01-01 ENCOUNTER — Other Ambulatory Visit: Payer: Self-pay | Admitting: Internal Medicine

## 2014-01-14 ENCOUNTER — Telehealth: Payer: Self-pay | Admitting: Internal Medicine

## 2014-01-14 NOTE — Telephone Encounter (Signed)
Patient Information:  Caller Name: [name]  Phone: [phone]  Patient: Maricela CuretHoward, Deania M  Gender: Female  DOB: 09/28/1975  Age: 38 Years  PCP: Plotnikov, Alex (Adults only)  Pregnant: No  Office Follow Up:  Does the office need to follow up with this patient?: Yes  Instructions For The Office: She is refusing to be seen due to needing to go to work, she has had this X1 before and states steroid was given and she also has been on lyrica for 2 months and states she has joing swelling. Please advise if Dr. wants any medication called in.  RN Note:  Afebrile. She went camping this weekend and today, 01/14/2014 uvula has swollen down touching her tongue (not width wise, length). Her voice quality is audibly different and she got SOB walking home from the pool with her son,which she normally does not. RN/CAN advising the ER and called office per the protocol for permission to send. Per [facility]: All emergent signs and symptoms of Edema, Atraumatic protocol ruled out except 'New onset or worsening shortness of breath or difficulty breathing. ER advised. "Adero" at the office told Dr. and he agreed to be seen in the ER. She is refusing due to needing to be at work. RN/CAN increased education regarding airway and edema and strongly advised she be seen somewhere and including sleep at night when the tongue can fall back towards the uvula and increase the issue. She will try and go somewhere.  Symptoms  Reason For Call & Symptoms: Went camping over the weekend and this am, 01/14/2014 uvula is swollen length wise not width  Reviewed Health History In EMR: Yes  Reviewed Medications In EMR: Yes  Reviewed Allergies In EMR: Yes  Reviewed Surgeries / Procedures: Yes  Date of Onset of Symptoms: 01/14/2014 OB / GYN:  LMP: Unknown  Guideline(s) Used:  Sore Throat  No Protocol Available - Sick Adult  Disposition Per Guideline:   Go to ED Now (or to Office with PCP Approval)  Reason For Disposition Reached:    Nursing judgment  Advice Given:  N/A  Patient Refused Recommendation:  Patient Will Follow Up With Office Later  She has work and not sure if she can go to ER, despite RN/CAN advice with edema and airway issues.

## 2014-01-28 ENCOUNTER — Encounter: Payer: Self-pay | Admitting: Internal Medicine

## 2014-01-28 ENCOUNTER — Ambulatory Visit (INDEPENDENT_AMBULATORY_CARE_PROVIDER_SITE_OTHER): Payer: BC Managed Care – PPO | Admitting: Internal Medicine

## 2014-01-28 VITALS — BP 120/80 | HR 80 | Temp 98.3°F | Resp 16 | Wt 188.0 lb

## 2014-01-28 DIAGNOSIS — F32A Depression, unspecified: Secondary | ICD-10-CM

## 2014-01-28 DIAGNOSIS — F3289 Other specified depressive episodes: Secondary | ICD-10-CM

## 2014-01-28 DIAGNOSIS — N62 Hypertrophy of breast: Secondary | ICD-10-CM

## 2014-01-28 DIAGNOSIS — E538 Deficiency of other specified B group vitamins: Secondary | ICD-10-CM

## 2014-01-28 DIAGNOSIS — M545 Low back pain, unspecified: Secondary | ICD-10-CM

## 2014-01-28 DIAGNOSIS — F329 Major depressive disorder, single episode, unspecified: Secondary | ICD-10-CM

## 2014-01-28 DIAGNOSIS — E559 Vitamin D deficiency, unspecified: Secondary | ICD-10-CM

## 2014-01-28 DIAGNOSIS — M79605 Pain in left leg: Secondary | ICD-10-CM

## 2014-01-28 MED ORDER — TRAMADOL HCL 50 MG PO TABS: ORAL_TABLET | ORAL | Status: DC

## 2014-01-28 MED ORDER — PREGABALIN 75 MG PO CAPS: 75.0000 mg | ORAL_CAPSULE | Freq: Two times a day (BID) | ORAL | Status: DC

## 2014-01-28 NOTE — Progress Notes (Signed)
Pre visit review using our clinic review tool, if applicable. No additional management support is needed unless otherwise documented below in the visit note. 

## 2014-01-28 NOTE — Assessment & Plan Note (Signed)
F/u w/Dr Contagianis

## 2014-01-28 NOTE — Assessment & Plan Note (Signed)
Continue with current prescription therapy as reflected on the Med list. F/u w/Dr Mikal Planeabell

## 2014-01-28 NOTE — Assessment & Plan Note (Signed)
Stable Continue with current prescription therapy as reflected on the Med list.  

## 2014-01-28 NOTE — Progress Notes (Signed)
Subjective:     F/u more pain in the back and L leg. Seeing Dr Franky Machoabbell - had an MRI in 5/15. On Lyrica, Butrans. Tramadol prn C/o large breasts aggravating LBP: Dr Sandy Salaamontagianis C/o wt gain   Back Pain This is a chronic problem. The current episode started more than 1 year ago. The problem occurs constantly. The problem has been gradually improving since onset. The pain is present in the lumbar spine and gluteal. The quality of the pain is described as aching and shooting. The pain radiates to the left thigh. The pain is at a severity of 4/10. The pain is moderate. The pain is worse during the day. The symptoms are aggravated by standing. Associated symptoms include numbness. Pertinent negatives include no chest pain, fever or headaches. Risk factors include obesity. The treatment provided moderate relief.  LBP is worse - LLE pain  F/u depression.  She failed a drug test w/Dr Benjamine MolaKirchmeir - ETOH and amphet (+) - Per pt; she took Vyvance in error in place of Gabapentin 11/13. PT was advised - too $$$   Wt Readings from Last 3 Encounters:  01/28/14 188 lb (85.276 kg)  12/12/13 184 lb (83.462 kg)  10/01/13 179 lb (81.194 kg)   BP Readings from Last 3 Encounters:  01/28/14 120/80  12/12/13 128/84  10/01/13 132/102      Past Medical History  Diagnosis Date  . LBP (low back pain) 2011  . Vitamin B12 deficiency 2012  . Vitamin D deficiency 2012  . Depression      Review of Systems  Constitutional: Negative for fever.  HENT: Negative for ear pain.   Respiratory: Negative for choking, chest tightness and stridor.   Cardiovascular: Negative for chest pain and leg swelling.  Musculoskeletal: Positive for back pain and gait problem (some L hip pain). Negative for myalgias.  Skin: Negative for wound.  Neurological: Positive for numbness. Negative for seizures, syncope and headaches.  Psychiatric/Behavioral: Positive for dysphoric mood. Negative for suicidal ideas, behavioral  problems, sleep disturbance, self-injury, decreased concentration and agitation. The patient is not nervous/anxious.        Objective:   Physical Exam  Constitutional: She appears well-developed and well-nourished. No distress.  HENT:  Head: Normocephalic.  Right Ear: External ear normal.  Left Ear: External ear normal.  Nose: Nose normal.  Mouth/Throat: Oropharynx is clear and moist.  Eyes: Conjunctivae are normal. Pupils are equal, round, and reactive to light. Right eye exhibits no discharge. Left eye exhibits no discharge.  Neck: Normal range of motion. Neck supple. No JVD present. No tracheal deviation present. No thyromegaly present.  Cardiovascular: Normal rate, regular rhythm and normal heart sounds.   Pulmonary/Chest: No stridor. No respiratory distress. She has no wheezes.  Large breasts  Abdominal: Soft. Bowel sounds are normal. She exhibits no distension and no mass. There is no tenderness. There is no rebound and no guarding.  Musculoskeletal: She exhibits tenderness. She exhibits no edema.  LS is tender Strait leg elev is pos on L L troch major is tender to palp  Lymphadenopathy:    She has no cervical adenopathy.  Neurological: She displays normal reflexes. No cranial nerve deficit. She exhibits normal muscle tone. Coordination normal.  Skin: No rash noted. No erythema.  Psychiatric: Her behavior is normal. Judgment and thought content normal.  Not tearful    L LS and L SI is tender SI joints are tender B     Lab Results  Component Value Date  WBC 7.1 12/08/2012   HGB 14.2 12/08/2012   HCT 44.9 12/08/2012   PLT 262.0 07/01/2011   GLUCOSE 88 01/11/2012   ALT 19 01/25/2013   AST 21 01/25/2013   NA 137 01/11/2012   K 4.5 01/11/2012   CL 103 01/11/2012   CREATININE 0.82 01/11/2012   BUN 11 01/11/2012   CO2 25 01/11/2012   TSH 3.04 01/25/2013    LS MRI      Assessment & Plan:

## 2014-01-28 NOTE — Assessment & Plan Note (Signed)
Continue with current prescription therapy as reflected on the Med list.  

## 2014-02-01 ENCOUNTER — Telehealth: Payer: Self-pay | Admitting: *Deleted

## 2014-02-01 NOTE — Telephone Encounter (Signed)
Pt states she had talk with md about getting a breast reduction. Needing to get a letter from md stating it is medically neccessary  for her to have a breast reduction for tear lower back pain. Need letter address to Dr. Sherald Hessontogiannis...Raechel Chute/lmb

## 2014-02-05 NOTE — Telephone Encounter (Signed)
Notified pt letter is ready for pick-up...Raechel Chute/lmb

## 2014-02-05 NOTE — Telephone Encounter (Signed)
OK, done Thx

## 2014-03-01 ENCOUNTER — Other Ambulatory Visit: Payer: Self-pay | Admitting: Internal Medicine

## 2014-03-02 ENCOUNTER — Other Ambulatory Visit: Payer: Self-pay | Admitting: Internal Medicine

## 2014-03-05 NOTE — Telephone Encounter (Signed)
Pt called requesting status on refill for her Butran patches. Pls advise...Raechel Chute/lmb

## 2014-03-06 NOTE — Telephone Encounter (Signed)
Pt has called again she stated she has use her last patch, and the pharmacy has to special order med. Requesting refill to be done today pls...Raechel Chute/lmb

## 2014-03-07 MED ORDER — BUPRENORPHINE 20 MCG/HR TD PTWK: MEDICATED_PATCH | TRANSDERMAL | Status: DC

## 2014-03-07 NOTE — Addendum Note (Signed)
Addended by: Deatra JamesBRAND, LUCY M on: 03/07/2014 08:17 AM   Modules accepted: Orders

## 2014-03-07 NOTE — Telephone Encounter (Signed)
Called pt no answer LMOM rx has been fax to [facility]...Raechel Chute/lmb

## 2014-03-12 ENCOUNTER — Telehealth: Payer: Self-pay | Admitting: Internal Medicine

## 2014-03-12 ENCOUNTER — Ambulatory Visit: Payer: BC Managed Care – PPO | Admitting: Internal Medicine

## 2014-03-12 DIAGNOSIS — Z0289 Encounter for other administrative examinations: Secondary | ICD-10-CM

## 2014-03-12 NOTE — Telephone Encounter (Signed)
Patient no showed for acute visit today 03/12/2014.  Please advise.

## 2014-03-13 NOTE — Telephone Encounter (Signed)
Patient called and states that the traMADol (ULTRAM) 50 MG medication that she takes for her pain is not helping her. She wants to know if she can have something stronger for the pain. She was unable to come in for her appointment today that she scheduled for her knee and back pain. Please advise.

## 2014-03-14 ENCOUNTER — Other Ambulatory Visit: Payer: Self-pay | Admitting: Internal Medicine

## 2014-03-14 NOTE — Telephone Encounter (Signed)
Pt informed- she wants to know if she can increase Lyrica dose and ok to Rf Tramadol? Please advise.

## 2014-03-14 NOTE — Telephone Encounter (Signed)
Ok Lyrica 75 mg tid Ok to ref Tramadol Thx

## 2014-03-14 NOTE — Telephone Encounter (Signed)
Cont w/current meds please Thx

## 2014-03-15 MED ORDER — TRAMADOL HCL 50 MG PO TABS: ORAL_TABLET | ORAL | Status: DC

## 2014-03-15 NOTE — Telephone Encounter (Signed)
Called pt no answer LMOM md ok TID for lyrica & tramadol has been sent to [facility]....Raechel Chute/lmb

## 2014-03-22 ENCOUNTER — Other Ambulatory Visit: Payer: Self-pay

## 2014-03-22 MED ORDER — PREGABALIN 75 MG PO CAPS: 75.0000 mg | ORAL_CAPSULE | Freq: Two times a day (BID) | ORAL | Status: DC

## 2014-03-29 ENCOUNTER — Telehealth: Payer: Self-pay

## 2014-03-29 MED ORDER — PREGABALIN 75 MG PO CAPS: 75.0000 mg | ORAL_CAPSULE | Freq: Three times a day (TID) | ORAL | Status: DC

## 2014-03-29 NOTE — Telephone Encounter (Signed)
Tid is correct\ Thx

## 2014-03-29 NOTE — Telephone Encounter (Signed)
Received fax from pharmacy stating that they were told by the pt that she takes lyrica 75 mg po tid instead of bid.  Please advise

## 2014-04-22 ENCOUNTER — Other Ambulatory Visit: Payer: Self-pay | Admitting: Internal Medicine

## 2014-04-23 NOTE — Telephone Encounter (Signed)
Ok to Rf in PCP's absence?  

## 2014-05-01 ENCOUNTER — Telehealth: Payer: Self-pay | Admitting: Internal Medicine

## 2014-05-01 ENCOUNTER — Ambulatory Visit: Payer: BC Managed Care – PPO | Admitting: Internal Medicine

## 2014-05-01 NOTE — Telephone Encounter (Signed)
Patient Information:  Caller Name: [name]  Phone: [phone]  Patient: [name], [name]  Gender: Female  DOB: 04/25/1976  Age: 38 Years  PCP: Plotnikov, Alex (Adults only)  Pregnant: No  Office Follow Up:  Does the office need to follow up with this patient?: No  Instructions For The Office: N/A   Symptoms  Reason For Call & Symptoms: Pt is calling and states that she has not hada BM in over 1 week; this happens frequently; stomach is very bloated/swollen; having upper abdominal pain; rates 3/10; constipation is bigger complaint;  Reviewed Health History In EMR: Yes  Reviewed Medications In EMR: Yes  Reviewed Allergies In EMR: Yes  Reviewed Surgeries / Procedures: Yes  Date of Onset of Symptoms: 04/24/2014  Treatments Tried: Miralax 1 capful daily for 4 days;  Treatments Tried Worked: No OB / GYN:  LMP: 04/18/2014  Guideline(s) Used:  Constipation  Disposition Per Guideline:   See Today in Office  Reason For Disposition Reached:   Abdomen is more swollen than usual  Advice Given:  High Fiber Diet:  Try to eat fresh fruit and vegetables at each meal (peas, prunes, citrus, apples, beans, corn).  Liquids:  Drink 6-8 glasses of water a day (Caution: certain medical conditions require fluid restriction).  Call Back If:  You become worse  Patient Will Follow Care Advice:  YES  Appointment Scheduled:  05/01/2014 15:45:00 Appointment Scheduled Provider:  Oliver BarreJohn, James (Adults only)

## 2014-05-03 ENCOUNTER — Encounter: Payer: Self-pay | Admitting: Internal Medicine

## 2014-05-03 ENCOUNTER — Other Ambulatory Visit (INDEPENDENT_AMBULATORY_CARE_PROVIDER_SITE_OTHER): Payer: BC Managed Care – PPO

## 2014-05-03 ENCOUNTER — Ambulatory Visit (INDEPENDENT_AMBULATORY_CARE_PROVIDER_SITE_OTHER): Payer: BC Managed Care – PPO | Admitting: Internal Medicine

## 2014-05-03 VITALS — BP 100/80 | HR 60 | Temp 98.2°F | Resp 14 | Wt 177.2 lb

## 2014-05-03 DIAGNOSIS — R109 Unspecified abdominal pain: Secondary | ICD-10-CM

## 2014-05-03 DIAGNOSIS — K625 Hemorrhage of anus and rectum: Secondary | ICD-10-CM

## 2014-05-03 DIAGNOSIS — K5900 Constipation, unspecified: Secondary | ICD-10-CM

## 2014-05-03 DIAGNOSIS — R1084 Generalized abdominal pain: Secondary | ICD-10-CM

## 2014-05-03 LAB — BASIC METABOLIC PANEL
BUN: 9 mg/dL (ref 6–23)
CALCIUM: 9.5 mg/dL (ref 8.4–10.5)
CO2: 25 mEq/L (ref 19–32)
CREATININE: 0.9 mg/dL (ref 0.4–1.2)
Chloride: 105 mEq/L (ref 96–112)
GFR: 76.53 mL/min (ref >60.00)
GLUCOSE: 98 mg/dL (ref 70–99)
Potassium: 4 mEq/L (ref 3.5–5.1)
Sodium: 136 mEq/L (ref 135–145)

## 2014-05-03 LAB — HEPATIC FUNCTION PANEL
ALBUMIN: 3.8 g/dL (ref 3.5–5.2)
ALT: 11 U/L (ref 0–35)
AST: 16 U/L (ref 0–37)
Alkaline Phosphatase: 43 U/L (ref 39–117)
Bilirubin, Direct: 0.1 mg/dL (ref 0.0–0.3)
Total Bilirubin: 0.4 mg/dL (ref 0.2–1.2)
Total Protein: 7.5 g/dL (ref 6.0–8.3)

## 2014-05-03 LAB — CBC WITH DIFFERENTIAL/PLATELET
Basophils Absolute: 0 10*3/uL (ref 0.0–0.1)
Basophils Relative: 0.4 % (ref 0.0–3.0)
EOS PCT: 0.9 % (ref 0.0–5.0)
Eosinophils Absolute: 0 10*3/uL (ref 0.0–0.7)
HCT: 38.6 % (ref 36.0–46.0)
Hemoglobin: 12.7 g/dL (ref 12.0–15.0)
LYMPHS PCT: 27 % (ref 12.0–46.0)
Lymphs Abs: 1.3 10*3/uL (ref 0.7–4.0)
MCHC: 33 g/dL (ref 30.0–36.0)
MCV: 85.9 fl (ref 78.0–100.0)
Monocytes Absolute: 0.4 10*3/uL (ref 0.1–1.0)
Monocytes Relative: 8.2 % (ref 3.0–12.0)
NEUTROS PCT: 63.5 % (ref 43.0–77.0)
Neutro Abs: 3.1 10*3/uL (ref 1.4–7.7)
PLATELETS: 280 10*3/uL (ref 150.0–400.0)
RBC: 4.49 Mil/uL (ref 3.87–5.11)
RDW: 13.6 % (ref 11.5–15.5)
WBC: 5 10*3/uL (ref 4.0–10.5)

## 2014-05-03 LAB — URINALYSIS
Hgb urine dipstick: NEGATIVE
Ketones, ur: NEGATIVE
LEUKOCYTES UA: NEGATIVE
Nitrite: NEGATIVE
Total Protein, Urine: NEGATIVE
UROBILINOGEN UA: 0.2 (ref 0.0–1.0)
Urine Glucose: NEGATIVE
pH: 5.5 (ref 5.0–8.0)

## 2014-05-03 LAB — TSH: TSH: 3.17 u[IU]/mL (ref 0.35–4.50)

## 2014-05-03 LAB — LIPASE: LIPASE: 23 U/L (ref 11.0–59.0)

## 2014-05-03 LAB — AMYLASE: AMYLASE: 45 U/L (ref 27–131)

## 2014-05-03 NOTE — Patient Instructions (Signed)
Your next office appointment will be determined based upon review of your pending labs. Those instructions will be transmitted to you through My Chart  Please report any significant change in your symptoms. The GI  referral will be scheduled and you'll be notified of the time.Please call the Referral Co-Ordinator @ [phone] if you have not been notified of appointment time within 7-10 days.

## 2014-05-03 NOTE — Progress Notes (Signed)
Pre visit review using our clinic review tool, if applicable. No additional management support is needed unless otherwise documented below in the visit note. 

## 2014-05-03 NOTE — Progress Notes (Signed)
   Subjective:    Patient ID: [name], female    DOB: 01/21/1976, 38 y.o.   MRN: [id number]  HPI   She is here with diffuse abdominal pain and bloating in the context of severe constipation.  She has been on narcotic pain patch for over a year but her constipation symptoms have been progressive over the last 2 months  On 04/28/14 she began to take MiraLax which she's done daily through today. As of 10/7-8 she took a full bottle of mag citrate. This resulted in a long thin stool which repeated shortly thereafter.  Typically she will have a bowel movement every 3 days 10 days.  She has intermittent painless rectal bleeding.  She also describes fatigue.  She was to have a colonoscopy in 2012 but this was not pursued because of financial reasons.  There is no family history of GI disease.  She has chronic low back pain for which she takes a narcotic patch. There is no history of surgery or injury to the back. She has  2 degenerative discs as well as bulging disc. Intermittently she'll have radiculopathy pain in the left lower extremity.  She works as a Associate Professorharmacy Tech @ [employer]  She is on Valium and Cymbalta from Dr. Rana SnareLowe, Gyn.  She denies any other bleeding dyscrasias    Review of Systems   She denies dysuria, pyuria, or hematuria.  She has no epistaxis, hemoptysis, melena, or abnormal bruising or bleeding.      Objective:   Physical Exam    Positive or pertinent findings include: She's wearing the pain patch over the lower spine. There is a tatoo inferior to the patch. She exhibits a modified low back crawl as she lies supine and sits back up.  General appearance :adequately nourished; in no distress. Eyes: No conjunctival inflammation or scleral icterus is present. Oral exam: Dental hygiene is good. Lips and gums are healthy appearing.There is no oropharyngeal erythema or exudate noted.  Heart:  Normal rate and regular rhythm. S1 and S2 normal without gallop,  murmur, click, rub or other extra sounds   Lungs:Chest clear to auscultation; no wheezes, rhonchi,rales ,or rubs present.No increased work of breathing.  Abdomen: bowel sounds normal, soft and non-tender without masses, organomegaly or hernias noted.  No guarding or rebound. No flank tenderness to percussion. Vascular : all pulses equal ; no bruits present. Skin:Warm & dry.  Intact without suspicious lesions or rashes ; no jaundice or tenting Lymphatic: No lymphadenopathy is noted about the head, neck, axilla            Assessment & Plan:  #1 diffuse abdominal pain and bloating  #2 constipation in the context of chronic narcotic administration  #3 intermittent rectal bleeding  #4 chronic low back pain syndrome  Plan: See orders & recommendations.

## 2014-05-06 ENCOUNTER — Telehealth: Payer: Self-pay | Admitting: Internal Medicine

## 2014-05-06 NOTE — Telephone Encounter (Signed)
emmi emailed °

## 2014-05-13 ENCOUNTER — Telehealth: Payer: Self-pay | Admitting: Internal Medicine

## 2014-05-13 NOTE — Telephone Encounter (Signed)
Called pt back inform her per records md has her taking the Lyrica three times a day. rx was sent to [facility] on 03/29/14. She stated they only filled for # 60. Once she need another refill she will contact md office to get correct script...[name]/lmb

## 2014-05-13 NOTE — Telephone Encounter (Signed)
Pt states her pharmacy needs and prescription edited, pt states it needs to say she can take up to  3 a day, Lyrica 75mg . [facility]

## 2014-05-16 ENCOUNTER — Other Ambulatory Visit: Payer: Self-pay | Admitting: Internal Medicine

## 2014-05-16 NOTE — Telephone Encounter (Signed)
Not due to be refilled until next week so will allow Dr. Posey ReaPlotnikov to refill next week. i

## 2014-05-17 ENCOUNTER — Other Ambulatory Visit: Payer: Self-pay | Admitting: Internal Medicine

## 2014-05-19 ENCOUNTER — Other Ambulatory Visit: Payer: Self-pay | Admitting: Internal Medicine

## 2014-05-20 ENCOUNTER — Telehealth: Payer: Self-pay | Admitting: Internal Medicine

## 2014-05-20 ENCOUNTER — Other Ambulatory Visit: Payer: Self-pay

## 2014-05-20 NOTE — Telephone Encounter (Signed)
Patients is almost out of tramadol [facility]  She is also requesting a script for butrans to be sent to pharmacy to be kept on file until she needs it.

## 2014-05-20 NOTE — Telephone Encounter (Signed)
OK to fill Tramadol and Butrans  prescriptions with additional refills x0. Needs OV w/me q 3 mo Thank you!

## 2014-05-21 MED ORDER — BUPRENORPHINE 20 MCG/HR TD PTWK: MEDICATED_PATCH | TRANSDERMAL | Status: DC

## 2014-05-21 MED ORDER — TRAMADOL HCL 50 MG PO TABS: ORAL_TABLET | ORAL | Status: DC

## 2014-05-21 NOTE — Telephone Encounter (Signed)
Notified pt rx's ready for pick-up../lmb 

## 2014-05-24 ENCOUNTER — Other Ambulatory Visit: Payer: Self-pay | Admitting: Geriatric Medicine

## 2014-05-24 MED ORDER — PREGABALIN 75 MG PO CAPS: 75.0000 mg | ORAL_CAPSULE | Freq: Three times a day (TID) | ORAL | Status: DC

## 2014-05-24 NOTE — Telephone Encounter (Signed)
Called pt back she stated pharmacy never received updated script from 03/29/14 on her Lyrica. Spoke with Janie/pharmacist @ [facility] inform her direction change toTID on 03/29/14 rx was fax per Wille Celestejanie they didn't receive. Gave authorization from 03/29/14...Raechel Chute/lmb

## 2014-05-24 NOTE — Telephone Encounter (Signed)
Patient wants to increase Lyrica from from 2 capsules daily to 3 capsules daily. Please advise, thanks.

## 2014-05-24 NOTE — Telephone Encounter (Signed)
Ok Thx 

## 2014-05-24 NOTE — Telephone Encounter (Signed)
Pt wanted clarify with nurse about meds.  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

## 2014-05-27 ENCOUNTER — Other Ambulatory Visit: Payer: Self-pay | Admitting: Geriatric Medicine

## 2014-05-27 MED ORDER — PREGABALIN 75 MG PO CAPS: 75.0000 mg | ORAL_CAPSULE | Freq: Three times a day (TID) | ORAL | Status: DC

## 2014-05-27 NOTE — Telephone Encounter (Signed)
[facility]

## 2014-06-07 ENCOUNTER — Other Ambulatory Visit: Payer: Self-pay | Admitting: Internal Medicine

## 2014-06-11 ENCOUNTER — Encounter: Payer: Self-pay | Admitting: Internal Medicine

## 2014-06-24 ENCOUNTER — Other Ambulatory Visit: Payer: Self-pay | Admitting: Internal Medicine

## 2014-06-25 ENCOUNTER — Other Ambulatory Visit: Payer: Self-pay | Admitting: Internal Medicine

## 2014-06-26 ENCOUNTER — Other Ambulatory Visit: Payer: Self-pay | Admitting: Internal Medicine

## 2014-06-26 NOTE — Telephone Encounter (Signed)
Se refill request

## 2014-06-26 NOTE — Telephone Encounter (Signed)
OK to fill this prescription with additional refills x0 Sch OV w/me Thank you!

## 2014-06-27 ENCOUNTER — Other Ambulatory Visit: Payer: Self-pay | Admitting: Internal Medicine

## 2014-06-27 NOTE — Telephone Encounter (Signed)
Please send to [facility]

## 2014-06-27 NOTE — Telephone Encounter (Signed)
Called refill into [facility] gave md authorization...Raechel Chute/lmb

## 2014-07-01 ENCOUNTER — Other Ambulatory Visit: Payer: Self-pay | Admitting: Internal Medicine

## 2014-07-02 ENCOUNTER — Encounter: Payer: Self-pay | Admitting: Internal Medicine

## 2014-07-04 NOTE — Telephone Encounter (Signed)
Pt calling as she is out of Tramadol, prescription sent to office 12/7 electronically. Pls advise

## 2014-07-04 NOTE — Telephone Encounter (Signed)
OK Pls call in Thx

## 2014-07-04 NOTE — Telephone Encounter (Signed)
Called [facility] had to leave msg on pharmacist vm. Left md authorization for tramadol...Raechel Chute/lmb

## 2014-07-22 ENCOUNTER — Other Ambulatory Visit: Payer: Self-pay | Admitting: Internal Medicine

## 2014-07-23 ENCOUNTER — Other Ambulatory Visit: Payer: Self-pay | Admitting: Internal Medicine

## 2014-07-24 ENCOUNTER — Encounter: Payer: Self-pay | Admitting: *Deleted

## 2014-07-24 NOTE — Telephone Encounter (Signed)
I need to see Allsion q 3 months for ViacomButrans Rx Thx

## 2014-07-24 NOTE — Telephone Encounter (Signed)
Faxed script to [facility] also sent pt mychart msg with md response...[name]/lmb

## 2014-07-29 ENCOUNTER — Ambulatory Visit (INDEPENDENT_AMBULATORY_CARE_PROVIDER_SITE_OTHER): Payer: BLUE CROSS/BLUE SHIELD | Admitting: Internal Medicine

## 2014-07-29 ENCOUNTER — Other Ambulatory Visit: Payer: Self-pay

## 2014-07-29 ENCOUNTER — Encounter: Payer: Self-pay | Admitting: Internal Medicine

## 2014-07-29 VITALS — BP 124/82 | HR 90 | Temp 98.4°F

## 2014-07-29 DIAGNOSIS — M5442 Lumbago with sciatica, left side: Secondary | ICD-10-CM

## 2014-07-29 DIAGNOSIS — M5441 Lumbago with sciatica, right side: Secondary | ICD-10-CM

## 2014-07-29 DIAGNOSIS — E538 Deficiency of other specified B group vitamins: Secondary | ICD-10-CM

## 2014-07-29 MED ORDER — CYANOCOBALAMIN 1000 MCG/ML IJ SOLN
1000.0000 ug | Freq: Once | INTRAMUSCULAR | Status: AC
  Administered 2014-07-29: 1000 ug via INTRAMUSCULAR

## 2014-07-29 MED ORDER — VITAMIN B-12 1000 MCG SL SUBL: 1.0000 | SUBLINGUAL_TABLET | Freq: Every day | SUBLINGUAL | Status: DC

## 2014-07-29 MED ORDER — BUPRENORPHINE 20 MCG/HR TD PTWK: MEDICATED_PATCH | TRANSDERMAL | Status: DC

## 2014-07-29 MED ORDER — VITAMIN D3 50 MCG (2000 UT) PO CAPS: 2000.0000 [IU] | ORAL_CAPSULE | Freq: Every day | ORAL | Status: DC

## 2014-07-29 NOTE — Progress Notes (Signed)
   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 Seeing Dr Franky Macho - had an MRI in 5/15. On Lyrica, Butrans. Tramadol prn  F/u large breasts aggravating LBP: Dr Sandy Salaam  No wt gain   Back Pain Associated symptoms include numbness. Pertinent negatives include no chest pain, fever or headaches.  LBP is worse - LLE pain  F/u depression.  She failed a drug test w/Dr Benjamine Mola - ETOH and amphet (+) - Per pt; she took Vyvance in error in place of Gabapentin 11/13. PT was advised - too $$$   Wt Readings from Last 3 Encounters:  05/03/14 177 lb 4 oz (80.4 kg)  01/28/14 188 lb (85.276 kg)  12/12/13 184 lb (83.462 kg)   BP Readings from Last 3 Encounters:  07/29/14 124/82  05/03/14 100/80  01/28/14 120/80      Past Medical History  Diagnosis Date  . LBP (low back pain) 2011  . Vitamin B12 deficiency 2012  . Vitamin D deficiency 2012  . Depression      Review of Systems  Constitutional: Negative for fever.  HENT: Negative for ear pain.   Respiratory: Negative for choking, chest tightness and stridor.   Cardiovascular: Negative for chest pain and leg swelling.  Musculoskeletal: Positive for back pain and gait problem (some L hip pain). Negative for myalgias.  Skin: Negative for wound.  Neurological: Positive for numbness. Negative for seizures, syncope and headaches.  Psychiatric/Behavioral: Positive for dysphoric mood. Negative for suicidal ideas, behavioral problems, sleep disturbance, self-injury, decreased concentration and agitation. The patient is not nervous/anxious.        Objective:   Physical Exam  Constitutional: She appears well-developed. No distress.  HENT:  Head: Normocephalic.  Right Ear: External ear normal.  Left Ear: External ear normal.  Nose: Nose normal.  Mouth/Throat: Oropharynx is clear and moist.  Eyes: Conjunctivae are normal. Pupils are equal,  round, and reactive to light. Right eye exhibits no discharge. Left eye exhibits no discharge.  Neck: Normal range of motion. Neck supple. No JVD present. No tracheal deviation present. No thyromegaly present.  Cardiovascular: Normal rate, regular rhythm and normal heart sounds.   Pulmonary/Chest: No stridor. No respiratory distress. She has no wheezes.  Abdominal: Soft. Bowel sounds are normal. She exhibits no distension and no mass. There is no tenderness. There is no rebound and no guarding.  Musculoskeletal: She exhibits tenderness. She exhibits no edema.  Lymphadenopathy:    She has no cervical adenopathy.  Neurological: She displays normal reflexes. No cranial nerve deficit. She exhibits normal muscle tone. Coordination normal.  Skin: No rash noted. No erythema.  Psychiatric: She has a normal mood and affect. Her behavior is normal. Judgment and thought content normal.   L LS and L SI is tender SI joints are tender B     Lab Results  Component Value Date   WBC 5.0 05/03/2014   HGB 12.7 05/03/2014   HCT 38.6 05/03/2014   PLT 280.0 05/03/2014   GLUCOSE 98 05/03/2014   ALT 11 05/03/2014   AST 16 05/03/2014   NA 136 05/03/2014   K 4.0 05/03/2014   CL 105 05/03/2014   CREATININE 0.9 05/03/2014   BUN 9 05/03/2014   CO2 25 05/03/2014   TSH 3.17 05/03/2014    LS MRI      Assessment & Plan:

## 2014-07-29 NOTE — Progress Notes (Signed)
Pre visit review using our clinic review tool, if applicable. No additional management support is needed unless otherwise documented below in the visit note. 

## 2014-07-29 NOTE — Assessment & Plan Note (Signed)
Continue with current prescription therapy as reflected on the Med list.  

## 2014-07-29 NOTE — Assessment & Plan Note (Signed)
Re- start Vit B12 °

## 2014-08-28 ENCOUNTER — Other Ambulatory Visit: Payer: Self-pay | Admitting: Internal Medicine

## 2014-09-05 ENCOUNTER — Encounter: Payer: Self-pay | Admitting: Internal Medicine

## 2014-09-06 NOTE — Telephone Encounter (Signed)
Called refill into pharmacy spoke with Denny Peonrin gave md approval.../lmb

## 2014-09-16 ENCOUNTER — Other Ambulatory Visit (INDEPENDENT_AMBULATORY_CARE_PROVIDER_SITE_OTHER): Payer: BLUE CROSS/BLUE SHIELD

## 2014-09-16 ENCOUNTER — Encounter: Payer: Self-pay | Admitting: Family Medicine

## 2014-09-16 ENCOUNTER — Ambulatory Visit (INDEPENDENT_AMBULATORY_CARE_PROVIDER_SITE_OTHER): Payer: BLUE CROSS/BLUE SHIELD | Admitting: Family Medicine

## 2014-09-16 VITALS — BP 126/82 | HR 93 | Ht 67.0 in | Wt 193.0 lb

## 2014-09-16 DIAGNOSIS — M533 Sacrococcygeal disorders, not elsewhere classified: Secondary | ICD-10-CM

## 2014-09-16 DIAGNOSIS — G8929 Other chronic pain: Secondary | ICD-10-CM

## 2014-09-16 DIAGNOSIS — M2550 Pain in unspecified joint: Secondary | ICD-10-CM

## 2014-09-16 LAB — SEDIMENTATION RATE: Sed Rate: 8 mm/hr (ref 0–22)

## 2014-09-16 LAB — FERRITIN: FERRITIN: 4 ng/mL — AB (ref 10.0–291.0)

## 2014-09-16 LAB — VITAMIN D 25 HYDROXY (VIT D DEFICIENCY, FRACTURES): VITD: 25.88 ng/mL — ABNORMAL LOW (ref 30.00–100.00)

## 2014-09-16 NOTE — Assessment & Plan Note (Addendum)
Patient has been treated for the MRI previously with 2 epidurals with no significant improvement. He did try a sacroiliac joint injection with some mild improvement today. We discussed home exercises and patient did work with the Event organiserathletic trainer in greater detail today. We discussed the icing regimen as well as over-the-counter medications that might be helpful. Patient has had this pain for 4 years. I do feel that further workup with labs could be helpful. We did check vitamin D,Lime titers , and other labs as order states. Patient and will come back and see me again in 2 weeks for further evaluation and treatment.  Patient continues to have pain I would like to consider an EMG to rule out any other nerve root impingement that could be concerning. Patient may be a candidate for MR neurography if we can not find reason.

## 2014-09-16 NOTE — Patient Instructions (Addendum)
Good to see you.  Ice 20 minutes 2 times daily. Usually after activity and before bed. Exercises 3 times a week.  We will get labs, will call you with the results   Take tylenol 650 mg three times a day is the best evidence based medicine we have for arthritis.  Glucosamine sulfate $RemoveBefor [id number] help moderate to severe arthritis. Vitamin D 2000 IU daily Fish oil 2 grams daily.  Tumeric 500mg  twice daily.  Continue the other medicines Capsaicin topically up to four times a day may also help with pain. Come back and see me in 2 weeks.   Sacroiliac Joint Mobilization and Rehab 1. Work on pretzel stretching, shoulder back and leg draped in front. 3-5 sets, 30 sec.. 2. hip abductor rotations. standing, hip flexion and rotation outward then inward. 3 sets, 15 reps. when can do comfortably, add ankle weights starting at 2 pounds.  3. cross over stretching - shoulder back to ground, same side leg crossover. 3-5 sets for 30 min..  4. rolling up and back knees to chest and rocking. 5. sacral tilt - 5 sets, hold for 5-10 seconds

## 2014-09-16 NOTE — Progress Notes (Signed)
Tawana ScaleZach Smith D.O. [facility] [address] [place], [place] [address] Phone: [phone] Subjective:    I'm seeing this patient by the request  of:  Sonda PrimesAlex Plotnikov, MD   CC: Back pain  BJY:NWGNFAOZHYHPI:Subjective Ebony StainHeather Tippets is a 39 y.o. female coming in with complaint of back pain. Patient has had this pain for quite some time and is seen a neurosurgeon. Patient did have an MRI may 2015. Patient even had an epidural in November 2015 with no significant improvement. Patient is on multiple different medications including Lyrica, butrans and tramadol. Patient describes the pain as a unrelenting pain that seems to be in the back that does radiate down the left leg. Patient is concerned because there has not been any significant findings that correspond to her findings. Patient has tried other options and was going to have a breast reduction surgery but did not have this approved by her insurance. Patient states from the pain she is not able to do the exercises like she is supposed to and she is gaining weight which is making her concern. Patient is taking the medications on a regular basis. Patient had not seen me for greater than one year.    Past medical history, social, surgical and family history all reviewed in electronic medical record.   Review of Systems: No headache, visual changes, nausea, vomiting, diarrhea, constipation, dizziness, abdominal pain, skin rash, fevers, chills, night sweats, weight loss, swollen lymph nodes, body aches, joint swelling, muscle aches, chest pain, shortness of breath, mood changes.   Objective Blood pressure 126/82, pulse 93, height 5\' 7"  (1.702 m), weight 193 lb (87.544 kg), SpO2 98 %.  General: No apparent distress alert and oriented x3 mood and affect normal, dressed appropriately.  HEENT: Pupils equal, extraocular movements intact  Respiratory: Patient's speak in full sentences and does not appear short of breath  Cardiovascular: No lower extremity  edema, non tender, no erythema  Skin: Warm dry intact with no signs of infection or rash on extremities or on axial skeleton.  Abdomen: Soft nontender  Neuro: Cranial nerves II through XII are intact, neurovascularly intact in all extremities with 2+ DTRs and 2+ pulses.  Lymph: No lymphadenopathy of posterior or anterior cervical chain or axillae bilaterally.  Gait normal with good balance and coordination.  MSK:  Non tender with full range of motion and good stability and symmetric strength and tone of shoulders, elbows, wrist, hip, knee and ankles bilaterally.  Back Exam:  Inspection: Unremarkable  Motion: Flexion 45 deg, Extension 25 deg, Side Bending to 45 deg bilaterally,  Rotation to 45 deg bilaterally  SLR laying: Negative  XSLR laying: Negative  Palpable tenderness: Tenderness to palpation over the left SI joint FABER: Positive left Sensory change: Gross sensation intact to all lumbar and sacral dermatomes.  Reflexes: 2+ at both patellar tendons, 2+ at achilles tendons, Babinski's downgoing.  Strength at foot  Plantar-flexion: 5/5 Dorsi-flexion: 5/5 Eversion: 5/5 Inversion: 5/5  Leg strength  Quad: 5/5 Hamstring: 5/5 Hip flexor: 4/5 Hip abductors: 4/5  Gait unremarkable.  Procedure: Real-time Ultrasound Guided Injection of left sacroiliac joint Device: GE Logiq E  Ultrasound guided injection is preferred based studies that show increased duration, increased effect, greater accuracy, decreased procedural pain, increased response rate, and decreased cost with ultrasound guided versus blind injection.  Verbal informed consent obtained.  Time-out conducted.  Noted no overlying erythema, induration, or other signs of local infection.  Skin prepped in a sterile fashion.  Local anesthesia: Topical Ethyl  chloride.  With sterile technique and under real time ultrasound guidance: With a 21-gauge 3 inch needle patient was injected with 2 mL of 0.5% Marcaine and 1 mL of Kenalog 41 g/dL    Completed without difficulty  Pain immediately resolved suggesting accurate placement of the medication.  Advised to call if fevers/chills, erythema, induration, drainage, or persistent bleeding.  Images permanently stored and available for review in the ultrasound unit.  Impression: Technically successful ultrasound guided injection.    Impression and Recommendations:     This case required medical decision making of moderate complexity.

## 2014-09-16 NOTE — Progress Notes (Signed)
Pre visit review using our clinic review tool, if applicable. No additional management support is needed unless otherwise documented below in the visit note. 

## 2014-09-17 ENCOUNTER — Telehealth: Payer: Self-pay | Admitting: *Deleted

## 2014-09-17 ENCOUNTER — Other Ambulatory Visit: Payer: Self-pay | Admitting: Internal Medicine

## 2014-09-17 LAB — URIC ACID: Uric Acid, Serum: 4.3 mg/dL (ref 2.4–7.0)

## 2014-09-17 LAB — PTH, INTACT AND CALCIUM
Calcium: 9.3 mg/dL (ref 8.4–10.5)
PTH: 85 pg/mL — ABNORMAL HIGH (ref 14–64)

## 2014-09-17 LAB — B. BURGDORFI ANTIBODIES: B burgdorferi Ab IgG+IgM: 0.65 {ISR}

## 2014-09-17 LAB — CALCIUM, IONIZED: CALCIUM ION: 1.28 mmol/L (ref 1.12–1.32)

## 2014-09-17 MED ORDER — BUPRENORPHINE 20 MCG/HR TD PTWK: MEDICATED_PATCH | TRANSDERMAL | Status: DC

## 2014-09-17 NOTE — Telephone Encounter (Signed)
OK to fill this prescription with additional refills x0 Thank you!  

## 2014-09-17 NOTE — Telephone Encounter (Signed)
Left msg on triage requesting refill on her [name]. Is this ok...Raechel Chute/lmb

## 2014-09-17 NOTE — Telephone Encounter (Signed)
Notified pt rx faxed to [facility]...Raechel Chute/lmb

## 2014-09-23 ENCOUNTER — Encounter: Payer: Self-pay | Admitting: Family Medicine

## 2014-09-25 ENCOUNTER — Telehealth: Payer: Self-pay | Admitting: Internal Medicine

## 2014-09-25 ENCOUNTER — Ambulatory Visit (INDEPENDENT_AMBULATORY_CARE_PROVIDER_SITE_OTHER): Payer: BLUE CROSS/BLUE SHIELD | Admitting: Internal Medicine

## 2014-09-25 ENCOUNTER — Encounter: Payer: Self-pay | Admitting: Internal Medicine

## 2014-09-25 VITALS — BP 112/80 | HR 81 | Temp 98.5°F | Ht 67.0 in | Wt 192.1 lb

## 2014-09-25 DIAGNOSIS — M4647 Discitis, unspecified, lumbosacral region: Secondary | ICD-10-CM

## 2014-09-25 DIAGNOSIS — M519 Unspecified thoracic, thoracolumbar and lumbosacral intervertebral disc disorder: Secondary | ICD-10-CM

## 2014-09-25 DIAGNOSIS — M5010 Cervical disc disorder with radiculopathy, unspecified cervical region: Secondary | ICD-10-CM

## 2014-09-25 NOTE — Progress Notes (Signed)
Pre visit review using our clinic review tool, if applicable. No additional management support is needed unless otherwise documented below in the visit note. 

## 2014-09-25 NOTE — Progress Notes (Signed)
Subjective:    Patient ID: Ebony StainHeather Bahl, female    DOB: 10/11/1975, 39 y.o.   MRN: [id number]  HPI Her symptoms began 09/23/14 at approximately 3 PM as gnagging discomfort in the left neck. 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 As of of 09/24/14 it had progressed and was associated with some "tingling" in the left upper extremity .  She also describes "inflammation at the base the neck between the shoulder blades". She obtained temporary relief with 3 ibuprofen. She has not found tramadol to be of benefit.  She was concerned as she developed some laryngitis today associated with some diffuse headaches. She is noted some mild ear pain without discharge. She had no other symptoms of upper respiratory tract infection.  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 She has had ESI treatment for this.     Review of Systems Fever, chills, sweats, or unexplained weight loss not present. Mental status change or memory loss denied. Blurred vision , diplopia or vision loss absent. Vertigo, near syncope or imbalance denied. There is no  weakness in extremities.   No loss of control of bladder or bowels. Radicular type pain absent. No seizure stigmata.  Frontal headache, facial pain , nasal purulence, dental pain, sore throat , otic pain or otic discharge denied. No fever , chills or sweats.    Objective:   Physical Exam Pertinent or positive findings include: Markedly decreased cervical ROM. There is accentuation of the curvature of the upper thoracic spine.  With gait testing there's a slight limp .  There was no cranial nerve deficit or neuromuscular deficit otherwise.   Gen.: Adequately nourished in appearance. Alert, appropriate and cooperative throughout exam. BMI:30.38 Head: Normocephalic without obvious abnormalities  Eyes: No corneal or  conjunctival inflammation noted. Pupils equal round reactive to light and accommodation. Extraocular motion intact. FOV & vision WNL Ears: External  ear exam reveals no significant lesions or deformities. Canals clear .TMs normal. Hearing is grossly normal bilaterally. Nose: External nasal exam reveals no deformity or inflammation. Nasal mucosa are pink and moist. No lesions or exudates noted.   Mouth: Oral mucosa and oropharynx reveal no lesions or exudates. Teeth in good repair. Neck: No deformities, masses, or tenderness noted. Thyroid normal. Lungs: Normal respiratory effort; chest expands symmetrically. Lungs are clear to auscultation without rales, wheezes, or increased work of breathing. Heart: Normal rate and rhythm. Normal S1 and S2. No gallop, click, or rub. No murmur. Abdomen: Bowel sounds normal; abdomen soft and nontender. No masses, organomegaly or hernias noted.                                Musculoskeletal/extremities:No clubbing, cyanosis, edema, or significant extremity  deformity noted.  Tone & strength normal. Hand joints normal  Fingernail  health good. Able to lie down & sit up w/o help.  Negative SLR bilaterally Vascular: Carotid, radial artery, dorsalis pedis and  posterior tibial pulses are full and equal. No bruits present. Neurologic: Alert and oriented x3. Deep tendon reflexes symmetrical and normal.    Skin: Intact without suspicious lesions or rashes. Lymph: No cervical, axillary lymphadenopathy present. Psych: Mood and affect are normal. 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 The symptoms are most likely exacerbated by suboptimal ergonomics at her job as a Teacher, early years/pre. This involves repeated neck and waist flexion.  #2  lumbosacral radiculopathy ; not symptomatic today  Plan: It was recommended that  she try to minimize the nonsteroidals as much as possible. She does have meloxicam which could be taken. She should take the generic Flexeril she has on hand as 5-10 milligrams at bedtime as needed. Cervical pillow was recommended . Most important will be referral to a physical therapist and addressing ergonomics @ work place.

## 2014-09-25 NOTE — Telephone Encounter (Signed)
Patient Name: Ebony StainHEATHER Bennett DOB: 06/15/1976 Initial Comment Caller states she is having neck stiffness Nurse Assessment Nurse: Ebony Elizabethrumbull, RN, [name] Date/Time (Eastern Time): 09/25/2014 9:31:43 AM Confirm and document reason for call. If symptomatic, describe symptoms. ---Caller states she developed neck stiffness 2 days ago. No injury in the past 3 days. No fever. Has the patient traveled out of the country within the last 30 days? ---No Does the patient require triage? ---Yes Related visit to physician within the last 2 weeks? ---No Does the PT have any chronic conditions? (i.e. diabetes, asthma, etc.) ---Yes List chronic conditions. ---Sacro Iliac Joint problem Did the patient indicate they were pregnant? ---No Guidelines Guideline Title Affirmed Question Affirmed Notes Neck Pain or Stiffness Weakness of an arm or hand left arm Final Disposition User Go to ED Now Ebony Elizabethrumbull, RN, [name]

## 2014-09-25 NOTE — Patient Instructions (Signed)
Use an anti-inflammatory cream such as Aspercreme or Zostrix cream twice a day to the affected area as needed. In lieu of this warm moist compresses or  hot water bottle can be used. Do not apply ice . Use a cervical memory foam pillow to prevent hyperextension or hyperflexion of the cervical spine. Assess ergonomic risks for repetitive neck/spine movement at work. Usually this is related to the height @ which  computer  is placed and lack of back support while working. Flexeril 5 mg 1-2 qhs prn. The Physical Therapy  referral will be scheduled and you'll be notified of the time.Please call the Referral Co-Ordinator @ [phone] if you have not been notified of appointment time within 7-10 days.

## 2014-09-26 ENCOUNTER — Other Ambulatory Visit: Payer: Self-pay | Admitting: Obstetrics and Gynecology

## 2014-09-27 LAB — CYTOLOGY - PAP

## 2014-10-09 ENCOUNTER — Telehealth: Payer: Self-pay | Admitting: Internal Medicine

## 2014-10-09 NOTE — Telephone Encounter (Signed)
Patient came in office.  She is in a lot of pain.  The patches along with Ibuprofen and tylenol are not helping.  Patient is at point she can not walk.  Patient has appointment coming up.  Would like phone call in regards.  Please advise.

## 2014-10-10 NOTE — Telephone Encounter (Signed)
GrenadaBrittany can you give Ebony SetaHeather a call in regards to who we refer to.

## 2014-10-10 NOTE — Telephone Encounter (Signed)
We need to ref [name] to the pain clinic. OK to schedule? Thx

## 2014-10-11 ENCOUNTER — Other Ambulatory Visit: Payer: Self-pay | Admitting: Internal Medicine

## 2014-10-12 ENCOUNTER — Other Ambulatory Visit: Payer: Self-pay | Admitting: Internal Medicine

## 2014-10-14 MED ORDER — BUPRENORPHINE 20 MCG/HR TD PTWK: MEDICATED_PATCH | TRANSDERMAL | Status: DC

## 2014-10-14 NOTE — Telephone Encounter (Signed)
Rx didn't print md printed from home. Reprinted script...Raechel Chute/lmb

## 2014-10-14 NOTE — Telephone Encounter (Signed)
Faxed script to [facility].../lmb 

## 2014-10-14 NOTE — Addendum Note (Signed)
Addended by: Deatra JamesBRAND, Rudolfo Brandow M on: 10/14/2014 09:23 AM   Modules accepted: Orders

## 2014-10-15 NOTE — Telephone Encounter (Signed)
Spoke w/pt. She will call me back regarding her referrals.

## 2014-10-23 NOTE — Telephone Encounter (Signed)
Done

## 2014-10-28 ENCOUNTER — Ambulatory Visit (INDEPENDENT_AMBULATORY_CARE_PROVIDER_SITE_OTHER): Payer: BLUE CROSS/BLUE SHIELD | Admitting: Internal Medicine

## 2014-10-28 ENCOUNTER — Encounter: Payer: Self-pay | Admitting: Internal Medicine

## 2014-10-28 DIAGNOSIS — G8929 Other chronic pain: Secondary | ICD-10-CM

## 2014-10-28 DIAGNOSIS — M5441 Lumbago with sciatica, right side: Secondary | ICD-10-CM | POA: Diagnosis not present

## 2014-10-28 DIAGNOSIS — R41840 Attention and concentration deficit: Secondary | ICD-10-CM | POA: Diagnosis not present

## 2014-10-28 DIAGNOSIS — F329 Major depressive disorder, single episode, unspecified: Secondary | ICD-10-CM

## 2014-10-28 DIAGNOSIS — F341 Dysthymic disorder: Secondary | ICD-10-CM | POA: Diagnosis not present

## 2014-10-28 DIAGNOSIS — M5440 Lumbago with sciatica, unspecified side: Principal | ICD-10-CM

## 2014-10-28 MED ORDER — PHENTERMINE HCL 37.5 MG PO TABS: 37.5000 mg | ORAL_TABLET | Freq: Every day | ORAL | Status: DC

## 2014-10-28 MED ORDER — BUPRENORPHINE 20 MCG/HR TD PTWK: MEDICATED_PATCH | TRANSDERMAL | Status: DC

## 2014-10-28 MED ORDER — PREGABALIN 150 MG PO CAPS: 150.0000 mg | ORAL_CAPSULE | Freq: Two times a day (BID) | ORAL | Status: DC

## 2014-10-28 MED ORDER — PREGABALIN 150 MG PO CAPS: 150.0000 mg | ORAL_CAPSULE | Freq: Three times a day (TID) | ORAL | Status: DC

## 2014-10-28 NOTE — Progress Notes (Signed)
Pre visit review using our clinic review tool, if applicable. No additional management support is needed unless otherwise documented below in the visit note. 

## 2014-10-28 NOTE — Progress Notes (Signed)
Subjective:   C/o stress: marital issues; her 39 yo dtr was at [facility] w/opositional disorder - better; father in law was in Hospice - died  F/u pain in the back and L leg. Seeing Dr Franky Machoabbell - had an MRI in 5/15. On Lyrica, Butrans. Tramadol prn  F/u large breasts (37 DDD) aggravating LBP and neck pain: Dr Sandy Salaamontagianis (high deductible)  C/o wt gain   Back Pain Associated symptoms include numbness. Pertinent negatives include no chest pain, fever or headaches.  LBP is worse - LLE pain  F/u depression.  She failed a drug test w/Dr Benjamine MolaKirchmeir - ETOH and amphet (+) - Per pt; she took Vyvance in error in place of Gabapentin 11/13. PT was advised - too $$$   Wt Readings from Last 3 Encounters:  10/28/14 198 lb (89.812 kg)  09/25/14 192 lb 2 oz (87.147 kg)  09/16/14 193 lb (87.544 kg)   BP Readings from Last 3 Encounters:  10/28/14 122/90  09/25/14 112/80  09/16/14 126/82      Past Medical History  Diagnosis Date  . LBP (low back pain) 2011  . Vitamin B12 deficiency 2012  . Vitamin D deficiency 2012  . Depression      Review of Systems  Constitutional: Negative for fever.  HENT: Negative for ear pain.   Respiratory: Negative for choking, chest tightness and stridor.   Cardiovascular: Negative for chest pain and leg swelling.  Musculoskeletal: Positive for back pain and gait problem (some L hip pain). Negative for myalgias.  Skin: Negative for wound.  Neurological: Positive for numbness. Negative for seizures, syncope and headaches.  Psychiatric/Behavioral: Positive for dysphoric mood. Negative for suicidal ideas, behavioral problems, sleep disturbance, self-injury, decreased concentration and agitation. The patient is not nervous/anxious.        Objective:   Physical Exam  Constitutional: She appears well-developed. No distress.  HENT:  Head: Normocephalic.  Right Ear: External ear normal.  Left Ear: External ear normal.  Nose: Nose normal.  Mouth/Throat:  Oropharynx is clear and moist.  Eyes: Conjunctivae are normal. Pupils are equal, round, and reactive to light. Right eye exhibits no discharge. Left eye exhibits no discharge.  Neck: Normal range of motion. Neck supple. No JVD present. No tracheal deviation present. No thyromegaly present.  Cardiovascular: Normal rate, regular rhythm and normal heart sounds.   Pulmonary/Chest: No stridor. No respiratory distress. She has no wheezes.  Abdominal: Soft. Bowel sounds are normal. She exhibits no distension and no mass. There is no tenderness. There is no rebound and no guarding.  Musculoskeletal: She exhibits tenderness. She exhibits no edema.  Lymphadenopathy:    She has no cervical adenopathy.  Neurological: She displays normal reflexes. No cranial nerve deficit. She exhibits normal muscle tone. Coordination normal.  Skin: No rash noted. No erythema.  Psychiatric: She has a normal mood and affect. Her behavior is normal. Judgment and thought content normal.   L LS and L SI is tender SI joints are tender B     Lab Results  Component Value Date   WBC 5.0 05/03/2014   HGB 12.7 05/03/2014   HCT 38.6 05/03/2014   PLT 280.0 05/03/2014   GLUCOSE 98 05/03/2014   ALT 11 05/03/2014   AST 16 05/03/2014   NA 136 05/03/2014   K 4.0 05/03/2014   CL 105 05/03/2014   CREATININE 0.9 05/03/2014   BUN 9 05/03/2014   CO2 25 05/03/2014   TSH 3.17 05/03/2014    LS MRI  Assessment & Plan:

## 2014-10-28 NOTE — Patient Instructions (Signed)
Low carb diet 

## 2014-10-30 ENCOUNTER — Encounter: Payer: Self-pay | Admitting: Internal Medicine

## 2014-10-31 ENCOUNTER — Other Ambulatory Visit: Payer: Self-pay | Admitting: Internal Medicine

## 2014-11-01 ENCOUNTER — Other Ambulatory Visit: Payer: Self-pay | Admitting: Internal Medicine

## 2014-11-01 MED ORDER — TRAMADOL HCL 50 MG PO TABS: 50.0000 mg | ORAL_TABLET | Freq: Two times a day (BID) | ORAL | Status: DC | PRN

## 2014-11-18 ENCOUNTER — Ambulatory Visit (INDEPENDENT_AMBULATORY_CARE_PROVIDER_SITE_OTHER): Payer: BLUE CROSS/BLUE SHIELD | Admitting: Family Medicine

## 2014-11-18 ENCOUNTER — Telehealth: Payer: Self-pay | Admitting: Internal Medicine

## 2014-11-18 DIAGNOSIS — R112 Nausea with vomiting, unspecified: Secondary | ICD-10-CM | POA: Diagnosis not present

## 2014-11-18 DIAGNOSIS — R42 Dizziness and giddiness: Secondary | ICD-10-CM | POA: Diagnosis not present

## 2014-11-18 LAB — POCT CBC
Granulocyte percent: 72.4 %G (ref 37–80)
HCT, POC: 40.2 % (ref 37.7–47.9)
Hemoglobin: 13.3 g/dL (ref 12.2–16.2)
Lymph, poc: 1.3 (ref 0.6–3.4)
MCH, POC: 28.2 pg (ref 27–31.2)
MCHC: 33.1 g/dL (ref 31.8–35.4)
MCV: 85.2 fL (ref 80–97)
MID (cbc): 0.4 (ref 0–0.9)
MPV: 7.7 fL (ref 0–99.8)
POC Granulocyte: 4.5 (ref 2–6.9)
POC LYMPH PERCENT: 20.8 %L (ref 10–50)
POC MID %: 6.8 %M (ref 0–12)
Platelet Count, POC: 295 10*3/uL (ref 142–424)
RBC: 4.72 M/uL (ref 4.04–5.48)
RDW, POC: 14.3 %
WBC: 6.2 10*3/uL (ref 4.6–10.2)

## 2014-11-18 MED ORDER — PROMETHAZINE HCL 12.5 MG PO TABS: 12.5000 mg | ORAL_TABLET | Freq: Four times a day (QID) | ORAL | Status: DC | PRN

## 2014-11-18 NOTE — Telephone Encounter (Signed)
Patient has had a stomach virus.  She has taken off butran patch b/c she felt it was making her feel worse.  She is wanting to know if there are any side effects or if she needs to take in lower dosages until she is completley off.

## 2014-11-18 NOTE — Progress Notes (Addendum)
Patient ID: [name], female   DOB: 07/24/1976, 39 y.o.   MRN: [id number]  This chart was scribed for Elvina SidleKurt Nadezhda Pollitt, MD by Charline BillsEssence Howell, ED Scribe. The patient was seen in room 9. Patient's care was started at 11:23 AM.  Patient ID: Ebony StainHeather Bennett MRN: [id number], DOB: 03/17/1976, 39 y.o. Date of Encounter: 11/18/2014, 11:23 AM  Primary Physician: Sonda PrimesAlex Plotnikov, MD  Chief Complaint  Patient presents with  . Headache    Mostly at night  . Nausea  . unable to eat   HPI: 39 y.o. year old female with history below presents with gradually worsening, persistent nausea for the past 5 days. Pt reports associated dizziness 5 days ago that is exacerbated with standing and moving her head, but still present with lying still. She reports associated vomiting onset yesterday. Pt states that she has not had a meal in the past 4 days, only a few snacks. Pt denies abdominal pain, diarrhea. She reports sick contact at work with nausea and diarrhea for a few days also. Pt has been treating with Zofran with temporary relief. No h/o similar symptoms. No new medications. Pt denies possible pregnancy; her husband has had a vasectomy.   Pt has worked at [employer] for 6 years. She is married with 3 children who are all in school.   Past Medical History  Diagnosis Date  . LBP (low back pain) 2011  . Vitamin B12 deficiency 2012  . Vitamin D deficiency 2012  . Depression      Home Meds: Prior to Admission medications   Medication Sig Start Date End Date Taking? Authorizing Provider  albuterol (PROVENTIL HFA;VENTOLIN HFA) 108 (90 BASE) MCG/ACT inhaler Inhale 2 puffs into the lungs every 4 (four) hours as needed for wheezing (cough, shortness of breath or wheezing.). 12/08/12  Yes Collene GobbleSteven A Daub, MD  buprenorphine (BUTRANS) 20 MCG/HR PTWK patch APPLY 1 PATCH WEEKLY.ROTATE APPLICATION SITES. 10/28/14  Yes Aleksei Plotnikov V, MD  buPROPion (WELLBUTRIN XL) 300 MG 24 hr tablet Take 300 mg by mouth daily.    Yes Historical Provider, MD  Cholecalciferol (VITAMIN D3) 2000 UNITS capsule Take 1 capsule (2,000 Units total) by mouth daily. 07/29/14  Yes Aleksei Plotnikov V, MD  Cyanocobalamin (VITAMIN B-12) 1000 MCG SUBL Place 1 tablet (1,000 mcg total) under the tongue daily. 07/29/14  Yes Aleksei Plotnikov V, MD  Ondansetron HCl (ZOFRAN PO) Take by mouth.   Yes Historical Provider, MD  pregabalin (LYRICA) 150 MG capsule Take 1 capsule (150 mg total) by mouth 3 (three) times daily. 10/28/14  Yes Aleksei Plotnikov V, MD  traMADol (ULTRAM) 50 MG tablet Take 1-2 tablets (50-100 mg total) by mouth every 12 (twelve) hours as needed for severe pain. 11/01/14  Yes Aleksei Plotnikov V, MD  DULoxetine (CYMBALTA) 60 MG capsule Take 60 mg by mouth daily.    Historical Provider, MD  phentermine (ADIPEX-P) 37.5 MG tablet Take 1 tablet (37.5 mg total) by mouth daily before breakfast. Patient not taking: Reported on 11/18/2014 10/28/14   Tresa GarterAleksei Plotnikov V, MD    Allergies: No Known Allergies  History   Social History  . Marital Status: Married    Spouse Name: N/A  . Number of Children: N/A  . Years of Education: N/A   Occupational History  . Not on file.   Social History Main Topics  . Smoking status: Current Some Day Smoker -- 0.30 packs/day for 10 years    Types: Cigarettes  . Smokeless tobacco: Never Used  .  Alcohol Use: No  . Drug Use: No  . Sexual Activity: Yes   Other Topics Concern  . Not on file   Social History Narrative     Review of Systems: Constitutional: negative for chills, fever, night sweats, weight changes, or fatigue  HEENT: negative for vision changes, hearing loss, congestion, rhinorrhea, ST, epistaxis, or sinus pressure Cardiovascular: negative for chest pain or palpitations Respiratory: negative for hemoptysis, wheezing, shortness of breath, or cough Abdominal: negative for abdominal pain, diarrhea, or constipation, + nausea, + vomiting Dermatological: negative for rash Neurologic:  negative for headache, or syncope, + dizziness All other systems reviewed and are otherwise negative with the exception to those above and in the HPI.  Physical Exam: There were no vitals taken for this visit., There is no weight on file to calculate BMI. General: Well developed, well nourished, in no acute distress. Head: Normocephalic, atraumatic, eyes without discharge, sclera non-icteric, nares are without discharge. Bilateral auditory canals clear, TM's are without perforation, pearly grey and translucent with reflective cone of light bilaterally. Oral cavity moist, posterior pharynx without exudate, erythema, peritonsillar abscess, or post nasal drip.  Neck: Supple. No thyromegaly. Full ROM. No lymphadenopathy. Lungs: Clear bilaterally to auscultation without wheezes, rales, or rhonchi. Breathing is unlabored. Heart: RRR with S1 S2. No murmurs, rubs, or gallops appreciated. Abdomen: Soft, non-tender, non-distended with normoactive bowel sounds. No hepatomegaly. No rebound/guarding. No obvious abdominal masses. Msk:  Strength and tone normal for age. Extremities/Skin: Warm and dry. No clubbing or cyanosis. No edema. No rashes or suspicious lesions. Neuro: Alert and oriented X 3. Moves all extremities spontaneously. Gait is normal. CNII-XII grossly in tact. Psych:  Responds to questions appropriately with a normal affect.   Labs: Results for orders placed or performed in visit on 11/18/14  POCT CBC  Result Value Ref Range   WBC 6.2 4.6 - 10.2 K/uL   Lymph, poc 1.3 0.6 - 3.4   POC LYMPH PERCENT 20.8 10 - 50 %L   MID (cbc) 0.4 0 - 0.9   POC MID % 6.8 0 - 12 %M   POC Granulocyte 4.5 2 - 6.9   Granulocyte percent 72.4 37 - 80 %G   RBC 4.72 4.04 - 5.48 M/uL   Hemoglobin 13.3 12.2 - 16.2 g/dL   HCT, POC 13.0 86.5 - 47.9 %   MCV 85.2 80 - 97 fL   MCH, POC 28.2 27 - 31.2 pg   MCHC 33.1 31.8 - 35.4 g/dL   RDW, POC 78.4 %   Platelet Count, POC 295 142 - 424 K/uL   MPV 7.7 0 - 99.8 fL    12:00 PM: Check on patient, still mildly nauseated with IV infusing well, 300 mL left in first liter of IV fluids   ASSESSMENT AND PLAN:  39 y.o. year old female with  This chart was scribed in my presence and reviewed by me personally. This appears to be a viral illness    ICD-9-CM ICD-10-CM   1. Nausea and vomiting, vomiting of unspecified type 787.01 R11.2 POCT CBC  2. Lightheadedness 780.4 R42    Patient has Phenergan and Zofran at home.  Signed, Elvina Sidle, MD   Signed, Elvina Sidle, MD 11/18/2014 11:23 AM

## 2014-11-18 NOTE — Patient Instructions (Signed)
The CBC results suggest that this is a viral illness. Usually with fluid replacement, the symptoms are better and does appear in 24-48 hours. Please let us know if we need to call and Zofran for you but usually this doesn't work as well as Phenergan.

## 2014-11-18 NOTE — Telephone Encounter (Signed)
Pt informed

## 2014-11-18 NOTE — Telephone Encounter (Signed)
No need to reduce the dose Phenergan emailed for nausea. Use Imodium AD for diarrhea Take OTC Nexium qd until better Thx

## 2014-12-30 ENCOUNTER — Other Ambulatory Visit: Payer: Self-pay | Admitting: Internal Medicine

## 2014-12-31 NOTE — Telephone Encounter (Signed)
Called pharmacy had to leave refill msg on pharmacist vm.../lmb 

## 2015-02-07 ENCOUNTER — Other Ambulatory Visit: Payer: Self-pay | Admitting: Internal Medicine

## 2015-03-25 ENCOUNTER — Ambulatory Visit: Payer: BLUE CROSS/BLUE SHIELD | Admitting: Internal Medicine

## 2015-03-25 DIAGNOSIS — Z0289 Encounter for other administrative examinations: Secondary | ICD-10-CM

## 2015-04-03 ENCOUNTER — Telehealth: Payer: Self-pay | Admitting: Internal Medicine

## 2015-04-03 NOTE — Telephone Encounter (Signed)
[facility] - Client TELEPHONE ADVICE RECORD [facility]  Patient Name: [name]  DOB: [birth year]    Initial Comment Caller states that her uvula is swollen and touching the back of her tongue and feels like she is going to gag.    Nurse Assessment  Nurse: Phylliss Bob, RN, Synetta Fail Date/Time (Eastern Time): 04/03/2015 3:27:44 PM  Confirm and document reason for call. If symptomatic, describe symptoms. ---Caller states that her uvula is swollen and touching the back of her tongue and feels like she is going to gag. caller stated that she has no breathing difficulties and has had a sore throat for a bout 2 days ago and has been able to take fluids fairly well and has had no fevers and has no vomiting at present decreased appetite at present  Has the patient traveled out of the country within the last 30 days? ---No  Does the patient require triage? ---Yes  Related visit to physician within the last 2 weeks? ---No  Does the PT have any chronic conditions? (i.e. diabetes, asthma, etc.) ---No  Did the patient indicate they were pregnant? ---No     Guidelines    Guideline Title Affirmed Question Affirmed Notes  Uvula Swelling Swollen uvula    Final Disposition User   Go to ED Now Phylliss Bob, RN, Synetta Fail    Referrals  [facility] [place] - ED   Disagree/Comply: Comply

## 2015-04-04 ENCOUNTER — Ambulatory Visit (INDEPENDENT_AMBULATORY_CARE_PROVIDER_SITE_OTHER): Payer: BLUE CROSS/BLUE SHIELD | Admitting: Family Medicine

## 2015-04-04 VITALS — BP 118/64 | HR 69 | Temp 98.5°F | Resp 18 | Wt 161.6 lb

## 2015-04-04 DIAGNOSIS — K1379 Other lesions of oral mucosa: Secondary | ICD-10-CM | POA: Diagnosis not present

## 2015-04-04 DIAGNOSIS — J029 Acute pharyngitis, unspecified: Secondary | ICD-10-CM | POA: Diagnosis not present

## 2015-04-04 LAB — POCT RAPID STREP A (OFFICE): RAPID STREP A SCREEN: NEGATIVE

## 2015-04-04 MED ORDER — PREDNISONE 20 MG PO TABS: ORAL_TABLET | ORAL | Status: DC

## 2015-04-04 MED ORDER — CETIRIZINE HCL 10 MG PO TABS
10.0000 mg | ORAL_TABLET | Freq: Once | ORAL | Status: AC
Start: 2015-04-04 — End: 2015-04-04
  Administered 2015-04-04: 10 mg via ORAL

## 2015-04-04 MED ORDER — RANITIDINE HCL 150 MG PO TABS: 150.0000 mg | ORAL_TABLET | Freq: Two times a day (BID) | ORAL | Status: DC

## 2015-04-04 MED ORDER — METHYLPREDNISOLONE SODIUM SUCC 125 MG IJ SOLR
125.0000 mg | Freq: Once | INTRAMUSCULAR | Status: AC
  Administered 2015-04-04: 125 mg via INTRAMUSCULAR

## 2015-04-04 MED ORDER — RANITIDINE HCL 150 MG PO TABS
150.0000 mg | ORAL_TABLET | Freq: Once | ORAL | Status: AC
  Administered 2015-04-04: 150 mg via ORAL

## 2015-04-04 NOTE — Patient Instructions (Addendum)
Take prednisone 20 mg 3 daily for 2 days, then 2 daily for 2 days, then 1 daily for 2 days  Take cetirizine (Zyrtec) one daily  Take ranitidine (Zantac) 150 mg one twice daily  If worse at anytime go to the emergency room. If symptoms continue to persist return here.

## 2015-04-04 NOTE — Progress Notes (Signed)
Uvula edema Subjective:  Patient ID: [name], female    DOB: 10-22-1975  Age: 39 y.o. MRN: [id number]  Patient came in today complaining of her uvula being swollen. She's had this once before. She does not know of any trigger. It started about 4 days ago but his gotten bad today. She does have a sore throat. She feels like she is gagging on it. Nose no allergens she has taken. She does not have a history of much allergies.   Objective:   In some distress from the discomfort. Her TMs are normal. She did have some left ear pain but it looks normal. Her throat is erythematous, but especially the uvula is. It is about a centimeter in diameter and 2 cm long. Is not obstructing the airway. Strep test was taken. Neck supple without significant nodes. Chest clear. Heart regular without murmurs. She is anxious with it.  Assessment & Plan:   Assessment:  Uvula edema Sore throat Probable allergic process  Plan:  Cetirizine, ranitidine, and Solu-Medrol Strep screen and culture if necessary  Strep test is negative  Patient Instructions  Take prednisone 20 mg 3 daily for 2 days, then 2 daily for 2 days, then 1 daily for 2 days  Take cetirizine (Zyrtec) one daily  Take ranitidine (Zantac) 150 mg one twice daily  If worse at anytime go to the emergency room. If symptoms continue to persist return here.     Lyana Asbill, MD 04/04/2015

## 2015-04-06 LAB — CULTURE, GROUP A STREP: Organism ID, Bacteria: NORMAL

## 2015-04-14 ENCOUNTER — Telehealth: Payer: Self-pay

## 2015-04-14 NOTE — Telephone Encounter (Signed)
[facility] is requesting rx refill on tramadol  tab-----please advise, thanks

## 2015-04-14 NOTE — Telephone Encounter (Signed)
OK to fill this prescription with additional refills x0. Sch ov w/me Thank you!

## 2015-04-15 NOTE — Telephone Encounter (Signed)
Tramadol rx called in to [facility]---patient needs office visit before any further refills

## 2015-04-15 NOTE — Telephone Encounter (Signed)
Mike/pharmacist at [facility] states patient did not pick up refill for tramadol rx---i have cancelled rx at [facility]

## 2015-04-15 NOTE — Telephone Encounter (Signed)
This prescription needs to be sent to [facility] on [address].

## 2015-04-15 NOTE — Telephone Encounter (Signed)
i have called [facility] and cancelled refill for tramadol, patient wants rx sent to [facility]-----i have called another rx in to [facility] on [address]

## 2015-04-17 ENCOUNTER — Encounter: Payer: Self-pay | Admitting: Family Medicine

## 2015-04-17 ENCOUNTER — Ambulatory Visit (INDEPENDENT_AMBULATORY_CARE_PROVIDER_SITE_OTHER): Payer: BLUE CROSS/BLUE SHIELD | Admitting: Family Medicine

## 2015-04-17 VITALS — BP 130/84 | HR 96 | Ht 67.0 in | Wt 159.0 lb

## 2015-04-17 DIAGNOSIS — S76211A Strain of adductor muscle, fascia and tendon of right thigh, initial encounter: Secondary | ICD-10-CM | POA: Insufficient documentation

## 2015-04-17 NOTE — Progress Notes (Signed)
Pre visit review using our clinic review tool, if applicable. No additional management support is needed unless otherwise documented below in the visit note. 

## 2015-04-17 NOTE — Patient Instructions (Signed)
Good to see you Consider running and working out with a compression sleeve Ice 20 minutes 2 times daily. Usually after activity and before bed. Exercises 3 times a week.  Metatarsal pad can help the foot ([email or web address].com) otherwise spenco orthotics can help Don't lace the last eye of your shoe Protein and look at [place] sport See me again in 4 weeks.

## 2015-04-17 NOTE — Assessment & Plan Note (Signed)
I believe the patient does have a strain of the muscle. Patient has a negative fulcrum test I do not think that this is astress fracture. We discussed icing regimen, compression sleeve, and patient was given home exercises. Patient has tramadol for pain relief if necessary. Patient declined a anti-inflammatory. Encourage her to continue on the weight loss. Patient will follow-up in 4 weeks for further evaluation and treatment.

## 2015-04-17 NOTE — Progress Notes (Signed)
  [facility] [address] [place], [place] [address] Phone: [phone] Subjective:    I'm seeing this patient by the request  of:  Sonda Primes, MD   CC: right upper thigh pain  XNA:[id number] [name] is a 39 y.o. female coming in with complaint of right upper thigh pain. Patient has been running and has lost a total of 40 pounds. Patient has increased her running from 1 mile a week to 3 miles daily. Patient states that while running she seems to be doing relatively well but unfortunately with walking she is more of a discomfort on the right upper thigh. Denies any radiation down the leg. Rates the severity of pain a 6 out of 10. Patient was continued to be running but is concerned that she may make it worse. Patient denies any weakness. Denies any nighttime pain. Denies any pain with sitting or laying down. Denies any bowel or bladder changes. Denies any fever, chills, or any abnormal weight loss. Describes it as more of a dull aching sensation..    Past medical history, social, surgical and family history all reviewed in electronic medical record.   Review of Systems: No headache, visual changes, nausea, vomiting, diarrhea, constipation, dizziness, abdominal pain, skin rash, fevers, chills, night sweats, weight loss, swollen lymph nodes, body aches, joint swelling, muscle aches, chest pain, shortness of breath, mood changes.   Objective Blood pressure 130/84, pulse 96, height  (1.702 m), weight 159 lb (72.122 kg), last menstrual period 03/28/2015, SpO2 99 %.  General: No apparent distress alert and oriented x3 mood and affect normal, dressed appropriately.  HEENT: Pupils equal, extraocular movements intact  Respiratory: Patient's speak in full sentences and does not appear short of breath  Cardiovascular: No lower extremity edema, non tender, no erythema  Skin: Warm dry intact with no signs of infection or rash on extremities or on axial skeleton.   Abdomen: Soft nontender  Neuro: Cranial nerves II through XII are intact, neurovascularly intact in all extremities with 2+ DTRs and 2+ pulses.  Lymph: No lymphadenopathy of posterior or anterior cervical chain or axillae bilaterally.  Gait normal with good balance and coordination.  MSK:  Non tender with full range of motion and good stability and symmetric strength and tone of shoulders, elbows, wrist, hip, knee and ankles bilaterally.  Back Exam:  Inspection: Unremarkable  Motion: Flexion 45 deg, Extension 25 deg, Side Bending to 45 deg bilaterally,  Rotation to 45 deg bilaterally  SLR laying: Negative  XSLR laying: Negative  Palpable tenderness: minimal tenderness over the left SI joint FABER: Positive left Sensory change: Gross sensation intact to all lumbar and sacral dermatomes.  Reflexes: 2+ at both patellar tendons, 2+ at achilles tendons, Babinski's downgoing.  Strength at foot  Plantar-flexion: 5/5 Dorsi-flexion: 5/5 Eversion: 5/5 Inversion: 5/5  Leg strength  Quad: 5/5 Hamstring: 5/5 Hip flexor: 4/5 Hip abductors: 4/5  Gait unremarkable. Hip exam shows the patient does have some mild pain with resisted Adductionof the right hippatient is tender to palpation at the insertion of the adductor proximally. Full strength noted.     Impression and Recommendations:     This case required medical decision making of moderate complexity.

## 2015-04-22 ENCOUNTER — Ambulatory Visit: Payer: BLUE CROSS/BLUE SHIELD | Admitting: Internal Medicine

## 2015-04-24 ENCOUNTER — Encounter: Payer: Self-pay | Admitting: Internal Medicine

## 2015-04-24 ENCOUNTER — Ambulatory Visit (INDEPENDENT_AMBULATORY_CARE_PROVIDER_SITE_OTHER): Payer: BLUE CROSS/BLUE SHIELD | Admitting: Internal Medicine

## 2015-04-24 VITALS — BP 118/80 | HR 67 | Wt 160.0 lb

## 2015-04-24 DIAGNOSIS — E538 Deficiency of other specified B group vitamins: Secondary | ICD-10-CM

## 2015-04-24 DIAGNOSIS — M5440 Lumbago with sciatica, unspecified side: Secondary | ICD-10-CM | POA: Diagnosis not present

## 2015-04-24 DIAGNOSIS — F988 Other specified behavioral and emotional disorders with onset usually occurring in childhood and adolescence: Secondary | ICD-10-CM | POA: Insufficient documentation

## 2015-04-24 DIAGNOSIS — R41840 Attention and concentration deficit: Secondary | ICD-10-CM

## 2015-04-24 MED ORDER — VITAMIN D 1000 UNITS PO TABS: 1000.0000 [IU] | ORAL_TABLET | Freq: Every day | ORAL | Status: DC

## 2015-04-24 MED ORDER — ATOMOXETINE HCL 18 MG PO CAPS: 18.0000 mg | ORAL_CAPSULE | Freq: Every day | ORAL | Status: DC

## 2015-04-24 MED ORDER — ERGOCALCIFEROL 1.25 MG (50000 UT) PO CAPS: 50000.0000 [IU] | ORAL_CAPSULE | ORAL | Status: DC

## 2015-04-24 NOTE — Progress Notes (Signed)
Pre visit review using our clinic review tool, if applicable. No additional management support is needed unless otherwise documented below in the visit note. 

## 2015-04-24 NOTE — Assessment & Plan Note (Signed)
9/16 better w/wt loss - off Butrans. Tramadol prn

## 2015-04-24 NOTE — Assessment & Plan Note (Signed)
Re-start B12 

## 2015-04-24 NOTE — Assessment & Plan Note (Addendum)
Chronic per pt Behav med ref - marital difficulties stared due to forgetfulness We can try Strattera UDS

## 2015-04-24 NOTE — Progress Notes (Signed)
Subjective:  Patient ID: [name], female    DOB: [birth year]  Age: 39 y.o. MRN: [id number]  CC: No chief complaint on file.   HPI [name] presents for LBP. Pt is c/o trouble focusing:forgets meetings, paying bills, etc. LBP is better - pt lost 40 lbs. Comes w/a friend today  Outpatient Prescriptions Prior to Visit  Medication Sig Dispense Refill  . ALPRAZolam (XANAX) 0.25 MG tablet Take 0.25 mg by mouth at bedtime as needed for anxiety.    [name] buPROPion (WELLBUTRIN XL) 300 MG 24 hr tablet Take 300 mg by mouth daily.    . traMADol (ULTRAM) 50 MG tablet TAKE 1- 2 TABLETS BY MOUTH EVERY 12 HOURS AS NEEDED FOR SEVERE PAIN 60 tablet 2   No facility-administered medications prior to visit.    ROS Review of Systems  Constitutional: Negative for chills, activity change, appetite change, fatigue and unexpected weight change.  HENT: Negative for congestion, mouth sores and sinus pressure.   Eyes: Negative for visual disturbance.  Respiratory: Negative for cough and chest tightness.   Gastrointestinal: Negative for nausea and abdominal pain.  Genitourinary: Negative for frequency, difficulty urinating and vaginal pain.  Musculoskeletal: Positive for back pain. Negative for gait problem.  Skin: Negative for pallor and rash.  Neurological: Negative for dizziness, tremors, weakness, numbness and headaches.  Psychiatric/Behavioral: Positive for decreased concentration. Negative for suicidal ideas, confusion and sleep disturbance. The patient is nervous/anxious.     Objective:  BP 118/80 mmHg  Pulse 67  Wt 160 lb (72.576 kg)  SpO2 98%  LMP 03/28/2015  BP Readings from Last 3 Encounters:  04/24/15 118/80  04/17/15 130/84  04/04/15 118/64    Wt Readings from Last 3 Encounters:  04/24/15 160 lb (72.576 kg)  04/17/15 159 lb (72.122 kg)  04/04/15 161 lb 9.6 oz (73.301 kg)    Physical Exam  Constitutional: She appears well-developed. No distress.  HENT:  Head:  Normocephalic.  Right Ear: External ear normal.  Left Ear: External ear normal.  Nose: Nose normal.  Mouth/Throat: Oropharynx is clear and moist.  Eyes: Conjunctivae are normal. Pupils are equal, round, and reactive to light. Right eye exhibits no discharge. Left eye exhibits no discharge.  Neck: Normal range of motion. Neck supple. No JVD present. No tracheal deviation present. No thyromegaly present.  Cardiovascular: Normal rate, regular rhythm and normal heart sounds.   Pulmonary/Chest: No stridor. No respiratory distress. She has no wheezes.  Abdominal: Soft. Bowel sounds are normal. She exhibits no distension and no mass. There is no tenderness. There is no rebound and no guarding.  Musculoskeletal: She exhibits no edema or tenderness.  Lymphadenopathy:    She has no cervical adenopathy.  Neurological: She displays normal reflexes. No cranial nerve deficit. She exhibits normal muscle tone. Coordination normal.  Skin: No rash noted. No erythema.  Psychiatric: She has a normal mood and affect. Her behavior is normal. Judgment and thought content normal.    Lab Results  Component Value Date   WBC 6.2 11/18/2014   HGB 13.3 11/18/2014   HCT 40.2 11/18/2014   PLT 280.0 05/03/2014   GLUCOSE 98 05/03/2014   ALT 11 05/03/2014   AST 16 05/03/2014   NA 136 05/03/2014   K 4.0 05/03/2014   CL 105 05/03/2014   CREATININE 0.9 05/03/2014   BUN 9 05/03/2014   CO2 25 05/03/2014   TSH 3.17 05/03/2014    Mm Digital Diagnostic Unilat R  10/16/2013   CLINICAL DATA:  Right breast  calcifications on a recent baseline screening mammogram.  EXAM: DIGITAL DIAGNOSTIC  RIGHT MAMMOGRAM  COMPARISON:  Baseline screening mammogram dated 08/01/2013.  ACR Breast Density Category c: The breast tissue is heterogeneously dense, which may obscure small masses.  FINDINGS: Spot magnification views of the right breast demonstrate a large number of microcalcifications in a group spanning 7 cm in the upper inner right  breast. These demonstrate dependent layering in the true lateral projection.  IMPRESSION: Right breast benign milk of calcium.  No evidence of malignancy.  RECOMMENDATION: Annual screening mammography beginning at age 53.  I have discussed the findings and recommendations with the patient. Results were also provided in writing at the conclusion of the visit. If applicable, a reminder letter will be sent to the patient regarding the next appointment.  BI-RADS CATEGORY  2: Benign finding(s).   Electronically Signed   By: Gordan Payment M.D.   On: 10/16/2013 09:25    Assessment & Plan:   Diagnoses and all orders for this visit:  Midline low back pain with sciatica, sciatica laterality unspecified -     CBC with Differential/Platelet; Future -     IBC panel; Future -     Vitamin B12; Future -     TSH; Future  Attention deficit -     CBC with Differential/Platelet; Future -     IBC panel; Future -     Vitamin B12; Future -     TSH; Future -     Ambulatory referral to Psychology -     Hepatic function panel; Future  Vitamin B 12 deficiency -     CBC with Differential/Platelet; Future -     IBC panel; Future -     Vitamin B12; Future -     TSH; Future -     Hepatic function panel; Future  Other orders -     ergocalciferol (VITAMIN D2) 50000 UNITS capsule; Take 1 capsule (50,000 Units total) by mouth once a week. -     atomoxetine (STRATTERA) 18 MG capsule; Take 1 capsule (18 mg total) by mouth daily. -     cholecalciferol (VITAMIN D) 1000 UNITS tablet; Take 1 tablet (1,000 Units total) by mouth daily.  I am having [name] start on ergocalciferol, atomoxetine, and cholecalciferol. I am also having her maintain her buPROPion, traMADol, and ALPRAZolam.  Meds ordered this encounter  Medications  . ergocalciferol (VITAMIN D2) 50000 UNITS capsule    Sig: Take 1 capsule (50,000 Units total) by mouth once a week.    Dispense:  6 capsule    Refill:  0  . atomoxetine (STRATTERA) 18 MG capsule     Sig: Take 1 capsule (18 mg total) by mouth daily.    Dispense:  30 capsule    Refill:  3  . cholecalciferol (VITAMIN D) 1000 UNITS tablet    Sig: Take 1 tablet (1,000 Units total) by mouth daily.    Dispense:  100 tablet    Refill:  3     Follow-up: Return in about 3 months (around 07/24/2015) for a follow-up visit.  Sonda Primes, MD

## 2015-04-29 ENCOUNTER — Telehealth: Payer: Self-pay | Admitting: *Deleted

## 2015-04-29 NOTE — Telephone Encounter (Signed)
I called pt per PCP to inform Strattera sample pack is upfront for her to p/u.

## 2015-05-07 ENCOUNTER — Telehealth: Payer: Self-pay | Admitting: *Deleted

## 2015-05-07 NOTE — Telephone Encounter (Signed)
Rf req for Tramadol 50 mg 1-2 po q 12 hrs prn. Last filled 04/15/15. Ok to Rf?

## 2015-05-08 ENCOUNTER — Telehealth: Payer: Self-pay | Admitting: Internal Medicine

## 2015-05-08 MED ORDER — TRAMADOL HCL 50 MG PO TABS: 50.0000 mg | ORAL_TABLET | Freq: Two times a day (BID) | ORAL | Status: DC | PRN

## 2015-05-08 NOTE — Telephone Encounter (Signed)
Done

## 2015-05-08 NOTE — Telephone Encounter (Signed)
Noted. Thx.

## 2015-05-08 NOTE — Telephone Encounter (Signed)
OK to fill this prescription with additional refills x1 Thank you!  

## 2015-05-08 NOTE — Telephone Encounter (Signed)
Also, rec [facility] [address] for Tramadol 50 mg 1-2 po q 12 hr prn pain. # 60. Ok to Rf?  See below and advise on both.

## 2015-05-08 NOTE — Telephone Encounter (Signed)
Pt called stated Dr. Macario GoldsPlot gave her atomoxetine (STRATTERA) 18 MG , pt stated that she notice her agressive behavior so she stop taking this med on Monday 05/05/15. Pt stated she feel a lot better after stop this med, pt want to inform Dr. Macario GoldsPlot because she thinks that this medication was causing this behavior.

## 2015-07-04 ENCOUNTER — Telehealth: Payer: Self-pay | Admitting: Internal Medicine

## 2015-07-04 MED ORDER — TRAMADOL HCL 50 MG PO TABS: 50.0000 mg | ORAL_TABLET | Freq: Two times a day (BID) | ORAL | Status: DC | PRN

## 2015-07-04 NOTE — Telephone Encounter (Signed)
[facility] had to leave on pharmacy vm notified pt with md response..l.../lmb

## 2015-07-04 NOTE — Telephone Encounter (Signed)
OK to fill this prescription with additional refills x0 and sch an OV in 1 mo Thx Thank you!

## 2015-07-04 NOTE — Telephone Encounter (Signed)
Done. See meds.Pt informed. OV scheduled.

## 2015-07-04 NOTE — Telephone Encounter (Signed)
Pt called in and needs refill on her [facility] [address] for Tramadol 50 mg 1-2 po q 12 hr prn pain. # 60   ????

## 2015-08-04 ENCOUNTER — Ambulatory Visit (INDEPENDENT_AMBULATORY_CARE_PROVIDER_SITE_OTHER): Payer: BLUE CROSS/BLUE SHIELD | Admitting: Internal Medicine

## 2015-08-04 ENCOUNTER — Encounter: Payer: Self-pay | Admitting: Internal Medicine

## 2015-08-04 ENCOUNTER — Other Ambulatory Visit (INDEPENDENT_AMBULATORY_CARE_PROVIDER_SITE_OTHER): Payer: BLUE CROSS/BLUE SHIELD

## 2015-08-04 VITALS — BP 128/80 | HR 81 | Temp 98.5°F | Resp 20 | Ht 67.0 in | Wt 163.0 lb

## 2015-08-04 DIAGNOSIS — R41840 Attention and concentration deficit: Secondary | ICD-10-CM | POA: Diagnosis not present

## 2015-08-04 DIAGNOSIS — E559 Vitamin D deficiency, unspecified: Secondary | ICD-10-CM | POA: Diagnosis not present

## 2015-08-04 DIAGNOSIS — M5440 Lumbago with sciatica, unspecified side: Secondary | ICD-10-CM | POA: Diagnosis not present

## 2015-08-04 DIAGNOSIS — E538 Deficiency of other specified B group vitamins: Secondary | ICD-10-CM | POA: Diagnosis not present

## 2015-08-04 DIAGNOSIS — M545 Low back pain: Secondary | ICD-10-CM

## 2015-08-04 DIAGNOSIS — M79605 Pain in left leg: Secondary | ICD-10-CM

## 2015-08-04 LAB — CBC WITH DIFFERENTIAL/PLATELET
BASOS ABS: 0 10*3/uL (ref 0.0–0.1)
Basophils Relative: 0.3 % (ref 0.0–3.0)
EOS ABS: 0.1 10*3/uL (ref 0.0–0.7)
Eosinophils Relative: 0.6 % (ref 0.0–5.0)
HEMATOCRIT: 39.2 % (ref 36.0–46.0)
HEMOGLOBIN: 13.2 g/dL (ref 12.0–15.0)
LYMPHS ABS: 1.6 10*3/uL (ref 0.7–4.0)
Lymphocytes Relative: 18.7 % (ref 12.0–46.0)
MCHC: 33.8 g/dL (ref 30.0–36.0)
MCV: 89.3 fl (ref 78.0–100.0)
MONO ABS: 0.7 10*3/uL (ref 0.1–1.0)
Monocytes Relative: 7.8 % (ref 3.0–12.0)
NEUTROS ABS: 6.3 10*3/uL (ref 1.4–7.7)
Neutrophils Relative %: 72.6 % (ref 43.0–77.0)
PLATELETS: 318 10*3/uL (ref 150.0–400.0)
RBC: 4.39 Mil/uL (ref 3.87–5.11)
RDW: 13.6 % (ref 11.5–15.5)
WBC: 8.6 10*3/uL (ref 4.0–10.5)

## 2015-08-04 LAB — TSH: TSH: 3.49 u[IU]/mL (ref 0.35–4.50)

## 2015-08-04 LAB — HEPATIC FUNCTION PANEL
ALT: 9 U/L (ref 0–35)
AST: 12 U/L (ref 0–37)
Albumin: 4.4 g/dL (ref 3.5–5.2)
Alkaline Phosphatase: 43 U/L (ref 39–117)
BILIRUBIN DIRECT: 0.1 mg/dL (ref 0.0–0.3)
BILIRUBIN TOTAL: 0.4 mg/dL (ref 0.2–1.2)
Total Protein: 6.6 g/dL (ref 6.0–8.3)

## 2015-08-04 LAB — IBC PANEL
Iron: 46 ug/dL (ref 42–145)
SATURATION RATIOS: 9.1 % — AB (ref 20.0–50.0)
Transferrin: 362 mg/dL — ABNORMAL HIGH (ref 212.0–360.0)

## 2015-08-04 LAB — VITAMIN B12: Vitamin B-12: 389 pg/mL (ref 211–911)

## 2015-08-04 MED ORDER — ERGOCALCIFEROL 1.25 MG (50000 UT) PO CAPS: 50000.0000 [IU] | ORAL_CAPSULE | ORAL | Status: DC

## 2015-08-04 MED ORDER — VITAMIN B-12 1000 MCG SL SUBL: SUBLINGUAL_TABLET | SUBLINGUAL | Status: DC

## 2015-08-04 MED ORDER — TRAMADOL HCL 50 MG PO TABS: 50.0000 mg | ORAL_TABLET | Freq: Two times a day (BID) | ORAL | Status: DC | PRN

## 2015-08-04 MED ORDER — VITAMIN D3 50 MCG (2000 UT) PO CAPS: 2000.0000 [IU] | ORAL_CAPSULE | Freq: Every day | ORAL | Status: DC

## 2015-08-04 NOTE — Progress Notes (Signed)
Subjective:  Patient ID: [name], female    DOB: [birth year]  Age: 40 y.o. MRN: [id number]  CC: No chief complaint on file.   HPI [name] presents for LBP, anxiety, B12 def. Pt not taking Vit D and B12  Outpatient Prescriptions Prior to Visit  Medication Sig Dispense Refill  . ALPRAZolam (XANAX) 0.25 MG tablet Take 0.25 mg by mouth at bedtime as needed for anxiety.    [name] buPROPion (WELLBUTRIN XL) 300 MG 24 hr tablet Take 300 mg by mouth daily.    . ergocalciferol (VITAMIN D2) 50000 UNITS capsule Take 1 capsule (50,000 Units total) by mouth once a week. 6 capsule 0  . traMADol (ULTRAM) 50 MG tablet Take 1-2 tablets (50-100 mg total) by mouth every 12 (twelve) hours as needed. 60 tablet 0  . cholecalciferol (VITAMIN D) 1000 UNITS tablet Take 1 tablet (1,000 Units total) by mouth daily. (Patient not taking: Reported on 08/04/2015) 100 tablet 3   No facility-administered medications prior to visit.    ROS Review of Systems  Constitutional: Negative for chills, activity change, appetite change, fatigue and unexpected weight change.  HENT: Negative for congestion, mouth sores and sinus pressure.   Eyes: Negative for visual disturbance.  Respiratory: Negative for cough and chest tightness.   Gastrointestinal: Negative for nausea and abdominal pain.  Genitourinary: Negative for frequency, difficulty urinating and vaginal pain.  Musculoskeletal: Negative for back pain and gait problem.  Skin: Negative for pallor and rash.  Neurological: Negative for dizziness, tremors, weakness, numbness and headaches.  Psychiatric/Behavioral: Negative for suicidal ideas, confusion and sleep disturbance. The patient is not nervous/anxious.     Objective:  BP 128/80 mmHg  Pulse 81  Temp(Src) 98.5 F (36.9 C) (Oral)  Resp 20  Ht 5\' 7"  (1.702 m)  Wt 163 lb (73.936 kg)  BMI 25.52 kg/m2  SpO2 99%  BP Readings from Last 3 Encounters:  08/04/15 128/80  04/24/15 118/80  04/17/15 130/84     Wt Readings from Last 3 Encounters:  08/04/15 163 lb (73.936 kg)  04/24/15 160 lb (72.576 kg)  04/17/15 159 lb (72.122 kg)    Physical Exam  Constitutional: She appears well-developed. No distress.  HENT:  Head: Normocephalic.  Right Ear: External ear normal.  Left Ear: External ear normal.  Nose: Nose normal.  Mouth/Throat: Oropharynx is clear and moist.  Eyes: Conjunctivae are normal. Pupils are equal, round, and reactive to light. Right eye exhibits no discharge. Left eye exhibits no discharge.  Neck: Normal range of motion. Neck supple. No JVD present. No tracheal deviation present. No thyromegaly present.  Cardiovascular: Normal rate, regular rhythm and normal heart sounds.   Pulmonary/Chest: No stridor. No respiratory distress. She has no wheezes.  Abdominal: Soft. Bowel sounds are normal. She exhibits no distension and no mass. There is no tenderness. There is no rebound and no guarding.  Musculoskeletal: She exhibits no edema or tenderness.  Lymphadenopathy:    She has no cervical adenopathy.  Neurological: She displays normal reflexes. No cranial nerve deficit. She exhibits normal muscle tone. Coordination normal.  Skin: No rash noted. No erythema.  Psychiatric: She has a normal mood and affect. Her behavior is normal. Judgment and thought content normal.    Lab Results  Component Value Date   WBC 8.6 08/04/2015   HGB 13.2 08/04/2015   HCT 39.2 08/04/2015   PLT 318.0 08/04/2015   GLUCOSE 98 05/03/2014   ALT 9 08/04/2015   AST 12 08/04/2015   NA 136  05/03/2014   K 4.0 05/03/2014   CL 105 05/03/2014   CREATININE 0.9 05/03/2014   BUN 9 05/03/2014   CO2 25 05/03/2014   TSH 3.49 08/04/2015    Mm Digital Diagnostic Unilat R  10/16/2013  CLINICAL DATA:  Right breast calcifications on a recent baseline screening mammogram. EXAM: DIGITAL DIAGNOSTIC  RIGHT MAMMOGRAM COMPARISON:  Baseline screening mammogram dated 08/01/2013. ACR Breast Density Category c: The breast  tissue is heterogeneously dense, which may obscure small masses. FINDINGS: Spot magnification views of the right breast demonstrate a large number of microcalcifications in a group spanning 7 cm in the upper inner right breast. These demonstrate dependent layering in the true lateral projection. IMPRESSION: Right breast benign milk of calcium.  No evidence of malignancy. RECOMMENDATION: Annual screening mammography beginning at age 39. I have discussed the findings and recommendations with the patient. Results were also provided in writing at the conclusion of the visit. If applicable, a reminder letter will be sent to the patient regarding the next appointment. BI-RADS CATEGORY  2: Benign finding(s). Electronically Signed   By: Gordan PaymentSteve  Reid M.D.   On: 10/16/2013 09:25    Assessment & Plan:   Diagnoses and all orders for this visit:  Vitamin B 12 deficiency  Low back pain radiating to left leg  Vitamin D deficiency  Other orders -     Cholecalciferol (VITAMIN D3) 2000 units capsule; Take 1 capsule (2,000 Units total) by mouth daily. -     ergocalciferol (VITAMIN D2) 50000 units capsule; Take 1 capsule (50,000 Units total) by mouth once a week. -     traMADol (ULTRAM) 50 MG tablet; Take 1-2 tablets (50-100 mg total) by mouth every 12 (twelve) hours as needed. -     Cyanocobalamin (VITAMIN B-12) 1000 MCG SUBL; 1 sl qd   I have discontinued Ms. Hird's cholecalciferol. I am also having her start on Vitamin D3 and Vitamin B-12. Additionally, I am having her maintain her buPROPion, ALPRAZolam, ergocalciferol, and traMADol.  Meds ordered this encounter  Medications  . Cholecalciferol (VITAMIN D3) 2000 units capsule    Sig: Take 1 capsule (2,000 Units total) by mouth daily.    Dispense:  100 capsule    Refill:  3  . ergocalciferol (VITAMIN D2) 50000 units capsule    Sig: Take 1 capsule (50,000 Units total) by mouth once a week.    Dispense:  6 capsule    Refill:  0  . traMADol (ULTRAM) 50 MG  tablet    Sig: Take 1-2 tablets (50-100 mg total) by mouth every 12 (twelve) hours as needed.    Dispense:  60 tablet    Refill:  3  . Cyanocobalamin (VITAMIN B-12) 1000 MCG SUBL    Sig: 1 sl qd    Dispense:  100 tablet    Refill:  0     Follow-up: Return in about 4 months (around 12/02/2015) for a follow-up visit.  Sonda PrimesAlex Plotnikov, MD

## 2015-08-04 NOTE — Assessment & Plan Note (Addendum)
Re- start Vit B12 °

## 2015-08-04 NOTE — Assessment & Plan Note (Signed)
Re-start Vit D Labs 

## 2015-08-04 NOTE — Assessment & Plan Note (Signed)
Tramadol prn  Potential benefits of a long term opioids use as well as potential risks (i.e. addiction risk, apnea etc) and complications (i.e. Somnolence, constipation and others) were explained to the patient and were aknowledged. Re-start Vit D

## 2015-08-04 NOTE — Progress Notes (Signed)
Pre visit review using our clinic review tool, if applicable. No additional management support is needed unless otherwise documented below in the visit note. 

## 2015-08-05 ENCOUNTER — Encounter: Payer: Self-pay | Admitting: Internal Medicine

## 2015-10-31 ENCOUNTER — Telehealth: Payer: Self-pay | Admitting: Internal Medicine

## 2015-10-31 NOTE — Telephone Encounter (Signed)
OK to fill this prescription with additional refills x0. Sch OV pls Thank you!  

## 2015-10-31 NOTE — Telephone Encounter (Signed)
Pt request refill for traMADol (ULTRAM) 50 MG tablet. Please help

## 2015-11-03 NOTE — Telephone Encounter (Signed)
LVM for pt to call back as soon as possible.   RE: need to know what pharmacy to call rx into.    Pt needs an appt with PCP.

## 2015-11-03 NOTE — Telephone Encounter (Signed)
Left vm for patient to call back to make appt with Plot and to let us know where to send script.

## 2015-11-03 NOTE — Telephone Encounter (Signed)
Can you schedule appt for pt pls?

## 2015-11-04 MED ORDER — TRAMADOL HCL 50 MG PO TABS: 50.0000 mg | ORAL_TABLET | Freq: Two times a day (BID) | ORAL | Status: DC | PRN

## 2015-11-04 NOTE — Telephone Encounter (Signed)
[facility] [address]

## 2015-11-04 NOTE — Telephone Encounter (Signed)
rx faxed to pharmacy

## 2015-11-27 ENCOUNTER — Ambulatory Visit: Payer: BLUE CROSS/BLUE SHIELD | Admitting: Internal Medicine

## 2015-11-27 DIAGNOSIS — Z0289 Encounter for other administrative examinations: Secondary | ICD-10-CM

## 2016-01-08 ENCOUNTER — Encounter: Payer: Self-pay | Admitting: Family Medicine

## 2016-03-09 DIAGNOSIS — F902 Attention-deficit hyperactivity disorder, combined type: Secondary | ICD-10-CM | POA: Diagnosis not present

## 2016-03-09 DIAGNOSIS — Z79899 Other long term (current) drug therapy: Secondary | ICD-10-CM | POA: Diagnosis not present

## 2016-04-12 DIAGNOSIS — N390 Urinary tract infection, site not specified: Secondary | ICD-10-CM | POA: Diagnosis not present

## 2016-04-29 DIAGNOSIS — H5213 Myopia, bilateral: Secondary | ICD-10-CM | POA: Diagnosis not present

## 2016-08-05 ENCOUNTER — Telehealth: Payer: Self-pay | Admitting: Internal Medicine

## 2016-08-05 NOTE — Telephone Encounter (Signed)
PLEASE NOTE: All timestamps contained within this report are represented as [place] Standard Time. CONFIDENTIALTY NOTICE: This fax transmission is intended only for the addressee. It contains information that is legally privileged, confidential or otherwise protected from use or disclosure. If you are not the intended recipient, you are strictly prohibited from reviewing, disclosing, copying using or disseminating any of this information or taking any action in reliance on or regarding this information. If you have received this fax in error, please notify us immediately by telephone so that we can arrange for its return to us. Phone: [phone], Toll-Free: [phone], Fax: [phone] Page: 1 of 1 Call Id: [id number] [facility] - Client TELEPHONE ADVICE RECORD [facility] Patient Name: [name] DOB: 09/29/1975 Initial Comment Caller states she wants to know what the symptoms are for the flu? She has tightness in her chest, achy, tired and a runny nose Nurse Assessment Nurse: Kemper Durielarke, RN, Lurena Joinerebecca Date/Time (Eastern Time): 08/05/2016 8:54:36 AM Confirm and document reason for call. If symptomatic, describe symptoms. ---Caller states she has tightness in chest and behind shoulder blades, cough, chills, no fever. Does the patient have any new or worsening symptoms? ---Yes Will a triage be completed? ---Yes Related visit to physician within the last 2 weeks? ---No Does the PT have any chronic conditions? (i.e. diabetes, asthma, etc.) ---No Is the patient pregnant or possibly pregnant? (Ask all females between the ages of 3312-55) ---No Is this a behavioral health or substance abuse call? ---No Guidelines Guideline Title Affirmed Question Affirmed Notes Breathing Difficulty [1] MILD difficulty breathing (e.g., minimal/no SOB at rest, SOB with walking, pulse <100) AND [2] NEW-onset or WORSE than normal Final Disposition User See Physician within  4 Hours (or PCP triage) Kemper Durielarke, RN, Lurena Joinerebecca Comments CALLER REFUSED OUTCOME AND REQUESTS A CALL BACK. Referrals REFERRED TO PCP OFFICE Disagree/Comply: Comply

## 2016-08-06 NOTE — Telephone Encounter (Signed)
Left vm to call back to schedule with our office or to visit ER or Urgent Care today.

## 2016-08-06 NOTE — Telephone Encounter (Signed)
OV w/any provider or ER Thx 

## 2016-08-24 ENCOUNTER — Encounter: Payer: Self-pay | Admitting: Internal Medicine

## 2016-08-25 ENCOUNTER — Encounter: Payer: Self-pay | Admitting: Internal Medicine

## 2016-08-25 DIAGNOSIS — F419 Anxiety disorder, unspecified: Secondary | ICD-10-CM | POA: Diagnosis not present

## 2016-08-25 DIAGNOSIS — F902 Attention-deficit hyperactivity disorder, combined type: Secondary | ICD-10-CM | POA: Diagnosis not present

## 2016-08-25 DIAGNOSIS — Z79899 Other long term (current) drug therapy: Secondary | ICD-10-CM | POA: Diagnosis not present

## 2016-09-16 DIAGNOSIS — F411 Generalized anxiety disorder: Secondary | ICD-10-CM | POA: Diagnosis not present

## 2016-09-30 DIAGNOSIS — F411 Generalized anxiety disorder: Secondary | ICD-10-CM | POA: Diagnosis not present

## 2016-10-18 ENCOUNTER — Encounter: Payer: Self-pay | Admitting: Internal Medicine

## 2016-10-28 DIAGNOSIS — Z01419 Encounter for gynecological examination (general) (routine) without abnormal findings: Secondary | ICD-10-CM | POA: Diagnosis not present

## 2016-10-28 DIAGNOSIS — Z6827 Body mass index (BMI) 27.0-27.9, adult: Secondary | ICD-10-CM | POA: Diagnosis not present

## 2016-11-02 ENCOUNTER — Ambulatory Visit (INDEPENDENT_AMBULATORY_CARE_PROVIDER_SITE_OTHER): Payer: BLUE CROSS/BLUE SHIELD | Admitting: Nurse Practitioner

## 2016-11-02 ENCOUNTER — Other Ambulatory Visit: Payer: BLUE CROSS/BLUE SHIELD

## 2016-11-02 ENCOUNTER — Encounter: Payer: Self-pay | Admitting: Nurse Practitioner

## 2016-11-02 VITALS — BP 122/90 | HR 100 | Temp 98.2°F | Ht 67.0 in | Wt 170.0 lb

## 2016-11-02 DIAGNOSIS — K219 Gastro-esophageal reflux disease without esophagitis: Secondary | ICD-10-CM | POA: Diagnosis not present

## 2016-11-02 DIAGNOSIS — K5900 Constipation, unspecified: Secondary | ICD-10-CM | POA: Diagnosis not present

## 2016-11-02 DIAGNOSIS — K529 Noninfective gastroenteritis and colitis, unspecified: Secondary | ICD-10-CM | POA: Diagnosis not present

## 2016-11-02 MED ORDER — LINACLOTIDE 72 MCG PO CAPS: 72.0000 ug | ORAL_CAPSULE | Freq: Every day | ORAL | 1 refills | Status: DC

## 2016-11-02 MED ORDER — OMEPRAZOLE 20 MG PO CPDR: 20.0000 mg | DELAYED_RELEASE_CAPSULE | Freq: Two times a day (BID) | ORAL | 3 refills | Status: DC

## 2016-11-02 NOTE — Patient Instructions (Addendum)
You will be contacted with GI appt  Heartburn Heartburn is a type of pain or discomfort that can happen in the throat or chest. It is often described as a burning pain. It may also cause a bad taste in the mouth. Heartburn may feel worse when you lie down or bend over, and it is often worse at night. Heartburn may be caused by stomach contents that move back up into the esophagus (reflux). Follow these instructions at home: Take these actions to decrease your discomfort and to help avoid complications. Diet   Follow a diet as recommended by your health care provider. This may involve avoiding foods and drinks such as:  Coffee and tea (with or without caffeine).  Drinks that contain alcohol.  Energy drinks and sports drinks.  Carbonated drinks or sodas.  Chocolate and cocoa.  Peppermint and mint flavorings.  Garlic and onions.  Horseradish.  Spicy and acidic foods, including peppers, chili powder, curry powder, vinegar, hot sauces, and barbecue sauce.  Citrus fruit juices and citrus fruits, such as oranges, lemons, and limes.  Tomato-based foods, such as red sauce, chili, salsa, and pizza with red sauce.  Fried and fatty foods, such as donuts, french fries, potato chips, and high-fat dressings.  High-fat meats, such as hot dogs and fatty cuts of red and white meats, such as rib eye steak, sausage, ham, and bacon.  High-fat dairy items, such as whole milk, butter, and cream cheese.  Eat small, frequent meals instead of large meals.  Avoid drinking large amounts of liquid with your meals.  Avoid eating meals during the 2-3 hours before bedtime.  Avoid lying down right after you eat.  Do not exercise right after you eat. General instructions   Pay attention to any changes in your symptoms.  Take over-the-counter and prescription medicines only as told by your health care provider. Do not take aspirin, ibuprofen, or other NSAIDs unless your health care provider told you  to do so.  Do not use any tobacco products, including cigarettes, chewing tobacco, and e-cigarettes. If you need help quitting, ask your health care provider.  Wear loose-fitting clothing. Do not wear anything tight around your waist that causes pressure on your abdomen.  Raise (elevate) the head of your bed about 6 inches (15 cm).  Try to reduce your stress, such as with yoga or meditation. If you need help reducing stress, ask your health care provider.  If you are overweight, reduce your weight to an amount that is healthy for you. Ask your health care provider for guidance about a safe weight loss goal.  Keep all follow-up visits as told by your health care provider. This is important. Contact a health care provider if:  You have new symptoms.  You have unexplained weight loss.  You have difficulty swallowing, or it hurts to swallow.  You have wheezing or a persistent cough.  Your symptoms do not improve with treatment.  You have frequent heartburn for more than two weeks. Get help right away if:  You have pain in your arms, neck, jaw, teeth, or back.  You feel sweaty, dizzy, or light-headed.  You have chest pain or shortness of breath.  You vomit and your vomit looks like blood or coffee grounds.  Your stool is bloody or black. This information is not intended to replace advice given to you by your health care provider. Make sure you discuss any questions you have with your health care provider. Document Released: 11/28/2008 Document Revised: 12/18/2015  Document Reviewed: 11/06/2014 Elsevier Interactive Patient Education  2017 [employer].

## 2016-11-02 NOTE — Progress Notes (Signed)
Pre visit review using our clinic review tool, if applicable. No additional management support is needed unless otherwise documented below in the visit note. 

## 2016-11-02 NOTE — Progress Notes (Signed)
Subjective:  Patient ID: [name], female    DOB: [birth year]  Age: 41 y.o. MRN: [id number]  CC: Constipation (had heart burn--constipation--took miralx to help---diarrhea with alot of blood of blood at one itme--not more blood but long piece of stool)  Constipation  This is a chronic problem. The current episode started more than 1 year ago. The problem has been waxing and waning since onset. Her stool frequency is 2 to 3 times per week. The stool is described as formed and pellet like. The patient is on a high fiber diet. She does not exercise regularly. There has been adequate water intake. Associated symptoms include abdominal pain and anorexia.  Laxative used prn with relief.  GERD:  Use of pepcid BID and TUMS.  Diarrhea: Episodes of loose stool with blood. GI evaluation recommended in past, but never went due to cost.  No endoscopy or colonoscopy.  Outpatient Medications Prior to Visit  Medication Sig Dispense Refill  . ALPRAZolam (XANAX) 0.25 MG tablet Take 0.25 mg by mouth at bedtime as needed for anxiety.    [name] buPROPion (WELLBUTRIN XL) 300 MG 24 hr tablet Take 300 mg by mouth daily.    . Cholecalciferol (VITAMIN D3) 2000 units capsule Take 1 capsule (2,000 Units total) by mouth daily. (Patient not taking: Reported on 11/02/2016) 100 capsule 3  . Cyanocobalamin (VITAMIN B-12) 1000 MCG SUBL 1 sl qd (Patient not taking: Reported on 11/02/2016) 100 tablet 0  . ergocalciferol (VITAMIN D2) 50000 units capsule Take 1 capsule (50,000 Units total) by mouth once a week. (Patient not taking: Reported on 11/02/2016) 6 capsule 0  . traMADol (ULTRAM) 50 MG tablet Take 1-2 tablets (50-100 mg total) by mouth every 12 (twelve) hours as needed. (Patient not taking: Reported on 11/02/2016) 60 tablet 0   No facility-administered medications prior to visit.     ROS See HPI  Objective:  BP 122/90   Pulse 100   Temp 98.2 F (36.8 C)   Ht  (1.702 m)   Wt 170 lb (77.1 kg)   SpO2 99%    BMI 26.63 kg/m   BP Readings from Last 3 Encounters:  11/02/16 122/90  08/04/15 128/80  04/24/15 118/80    Wt Readings from Last 3 Encounters:  11/02/16 170 lb (77.1 kg)  08/04/15 163 lb (73.9 kg)  04/24/15 160 lb (72.6 kg)    Physical Exam  Constitutional: She is oriented to person, place, and time. No distress.  Neck: Normal range of motion. Neck supple.  Cardiovascular: Normal rate and normal heart sounds.   Pulmonary/Chest: Effort normal and breath sounds normal.  Abdominal: Soft. Bowel sounds are normal. She exhibits no distension. There is no tenderness.  Musculoskeletal: She exhibits no edema.  Neurological: She is alert and oriented to person, place, and time.  Skin: Skin is warm and dry.  Vitals reviewed.   Lab Results  Component Value Date   WBC 8.6 08/04/2015   HGB 13.2 08/04/2015   HCT 39.2 08/04/2015   PLT 318.0 08/04/2015   GLUCOSE 98 05/03/2014   ALT 9 08/04/2015   AST 12 08/04/2015   NA 136 05/03/2014   K 4.0 05/03/2014   CL 105 05/03/2014   CREATININE 0.9 05/03/2014   BUN 9 05/03/2014   CO2 25 05/03/2014   TSH 3.49 08/04/2015    Mm Digital Diagnostic Unilat R  Result Date: 10/16/2013 CLINICAL DATA:  Right breast calcifications on a recent baseline screening mammogram. EXAM: DIGITAL DIAGNOSTIC  RIGHT MAMMOGRAM COMPARISON:  Baseline screening mammogram dated 08/01/2013. ACR Breast Density Category c: The breast tissue is heterogeneously dense, which may obscure small masses. FINDINGS: Spot magnification views of the right breast demonstrate a large number of microcalcifications in a group spanning 7 cm in the upper inner right breast. These demonstrate dependent layering in the true lateral projection. IMPRESSION: Right breast benign milk of calcium.  No evidence of malignancy. RECOMMENDATION: Annual screening mammography beginning at age 25. I have discussed the findings and recommendations with the patient. Results were also provided in writing at the  conclusion of the visit. If applicable, a reminder letter will be sent to the patient regarding the next appointment. BI-RADS CATEGORY  2: Benign finding(s). Electronically Signed   By: Gordan Payment M.D.   On: 10/16/2013 09:25    Assessment & Plan:   [name] was seen today for constipation.  Diagnoses and all orders for this visit:  Constipation, unspecified constipation type -     Ambulatory referral to Gastroenterology -     linaclotide (LINZESS) 72 MCG capsule; Take 1 capsule (72 mcg total) by mouth daily before breakfast.  Chronic diarrhea -     Ambulatory referral to Gastroenterology  Gastroesophageal reflux disease, esophagitis presence not specified -     omeprazole (PRILOSEC) 20 MG capsule; Take 1 capsule (20 mg total) by mouth 2 (two) times daily before a meal. -     H Pylori, IGM, IGG, IGA AB; Future   I have discontinued [name]'s Vitamin D3, ergocalciferol, Vitamin B-12, and traMADol. I am also having her start on omeprazole and linaclotide. Additionally, I am having her maintain her buPROPion, ALPRAZolam, and amphetamine-dextroamphetamine.  Meds ordered this encounter  Medications  . omeprazole (PRILOSEC) 20 MG capsule    Sig: Take 1 capsule (20 mg total) by mouth 2 (two) times daily before a meal.    Dispense:  60 capsule    Refill:  3    Order Specific Question:   Supervising Provider    Answer:   Tresa Garter [[id number]]  . linaclotide (LINZESS) 72 MCG capsule    Sig: Take 1 capsule (72 mcg total) by mouth daily before breakfast.    Dispense:  30 capsule    Refill:  1    Order Specific Question:   Supervising Provider    Answer:   Tresa Garter [[id number]]  . amphetamine-dextroamphetamine (ADDERALL) 30 MG tablet    Sig: Take 1 tablet by mouth daily.    Refill:  0    Order Specific Question:   Supervising Provider    Answer:   Tresa Garter [[id number]]    Follow-up: Return if symptoms worsen or fail to improve.  Alysia Penna, NP

## 2016-11-04 LAB — H PYLORI, IGM, IGG, IGA AB

## 2016-11-11 ENCOUNTER — Encounter: Payer: Self-pay | Admitting: Nurse Practitioner

## 2016-11-18 ENCOUNTER — Ambulatory Visit: Payer: BLUE CROSS/BLUE SHIELD | Admitting: Nurse Practitioner

## 2016-11-24 ENCOUNTER — Ambulatory Visit (INDEPENDENT_AMBULATORY_CARE_PROVIDER_SITE_OTHER): Payer: BLUE CROSS/BLUE SHIELD | Admitting: Internal Medicine

## 2016-11-24 ENCOUNTER — Encounter: Payer: Self-pay | Admitting: Internal Medicine

## 2016-11-24 DIAGNOSIS — J019 Acute sinusitis, unspecified: Secondary | ICD-10-CM | POA: Insufficient documentation

## 2016-11-24 DIAGNOSIS — J309 Allergic rhinitis, unspecified: Secondary | ICD-10-CM | POA: Insufficient documentation

## 2016-11-24 MED ORDER — METHYLPREDNISOLONE ACETATE 80 MG/ML IJ SUSP
80.0000 mg | Freq: Once | INTRAMUSCULAR | Status: AC
  Administered 2016-11-24: 80 mg via INTRAMUSCULAR

## 2016-11-24 MED ORDER — LEVOFLOXACIN 500 MG PO TABS: 500.0000 mg | ORAL_TABLET | Freq: Every day | ORAL | 0 refills | Status: AC

## 2016-11-24 MED ORDER — TRIAMCINOLONE ACETONIDE 55 MCG/ACT NA AERO: 2.0000 | INHALATION_SPRAY | Freq: Every day | NASAL | 12 refills | Status: DC

## 2016-11-24 NOTE — Assessment & Plan Note (Signed)
Mild to mod seasonal flare,  for depomedrol IM 80, change flonase to nasacort to avoid nosebleeds,  to f/u any worsening symptoms or concerns

## 2016-11-24 NOTE — Progress Notes (Signed)
   Subjective:    Patient ID: [name], female    DOB: [birth year], 41 y.o.   MRN: [id number]  HPI   Here with 2-3 days acute onset fever, left facial/nasal pain, pressure, headache, general weakness and malaise, and greenish d/c, with mild ST and cough, but pt denies chest pain, wheezing, increased sob or doe, orthopnea, PND, increased LE swelling, palpitations, dizziness or syncope.  Thought she might have some sort of obstruction, but can breath with left nares and nettie pott seems to work.  Pt denies new neurological symptoms such as new headache, or facial or extremity weakness or numbness   Pt denies polydipsia, polyuria Past Medical History:  Diagnosis Date  . Depression   . LBP (low back pain) 2011  . Vitamin B12 deficiency 2012  . Vitamin D deficiency 2012   Past Surgical History:  Procedure Laterality Date  . CESAREAN SECTION  1-08    reports that she has never smoked. She has never used smokeless tobacco. She reports that she does not drink alcohol or use drugs. family history includes Cancer in her mother; Kidney disease in her other. Allergies  Allergen Reactions  . Strattera [Atomoxetine Hcl]     Aggressive behavior    Current Outpatient Prescriptions on File Prior to Visit  Medication Sig Dispense Refill  . amphetamine-dextroamphetamine (ADDERALL) 30 MG tablet Take 1 tablet by mouth daily.  0  . buPROPion (WELLBUTRIN XL) 300 MG 24 hr tablet Take 300 mg by mouth daily.    [name] omeprazole (PRILOSEC) 20 MG capsule Take 1 capsule (20 mg total) by mouth 2 (two) times daily before a meal. 60 capsule 3   No current facility-administered medications on file prior to visit.    Review of Systems All other system neg per pt    Objective:   Physical Exam BP 136/90   Pulse 98   Ht  (1.702 m)   Wt 171 lb (77.6 kg)   SpO2 100%   BMI 26.78 kg/m  VS noted, mild ill Constitutional: Pt appears in NAD HENT: Head: NCAT.  Right Ear: External ear normal.  Left Ear:  External ear normal.  Bilat tm's with mild erythema.  Max sinus areas severe left tender but right nontender,.  Pharynx with mild erythema, no exudate Eyes: . Pupils are equal, round, and reactive to light. Conjunctivae and EOM are normal Nose: without d/c or deformity Neck: Neck supple. Gross normal ROM Cardiovascular: Normal rate and regular rhythm.   Pulmonary/Chest: Effort normal and breath sounds without rales or wheezing.  Neurological: Pt is alert. At baseline orientation, motor grossly intact Skin: Skin is warm. No rashes, other new lesions, no LE edema Psychiatric: Pt behavior is normal without agitation  No other exam findings     Assessment & Plan:

## 2016-11-24 NOTE — Assessment & Plan Note (Addendum)
Mild to mod, for antibx course,  to f/u any worsening symptoms or concerns, consider ENT if not improved 2-3 days

## 2016-11-24 NOTE — Patient Instructions (Signed)
You had the steroid shot today  Please take all new medication as prescribed - the antibiotic  OK to change the flonase to nasacort for allergies, and continue the allegra  Please call in 2-3 days if not improved, for ENT referral  Please continue all other medications as before, and refills have been done if requested.  Please have the pharmacy call with any other refills you may need.  Please keep your appointments with your specialists as you may have planned

## 2016-11-24 NOTE — Progress Notes (Signed)
Pre visit review using our clinic review tool, if applicable. No additional management support is needed unless otherwise documented below in the visit note. 

## 2016-12-01 ENCOUNTER — Encounter: Payer: Self-pay | Admitting: Internal Medicine

## 2016-12-06 ENCOUNTER — Encounter: Payer: Self-pay | Admitting: Internal Medicine

## 2016-12-06 ENCOUNTER — Ambulatory Visit (INDEPENDENT_AMBULATORY_CARE_PROVIDER_SITE_OTHER): Payer: BLUE CROSS/BLUE SHIELD | Admitting: Internal Medicine

## 2016-12-06 DIAGNOSIS — G8929 Other chronic pain: Secondary | ICD-10-CM

## 2016-12-06 DIAGNOSIS — M25511 Pain in right shoulder: Secondary | ICD-10-CM | POA: Diagnosis not present

## 2016-12-06 DIAGNOSIS — R41840 Attention and concentration deficit: Secondary | ICD-10-CM

## 2016-12-06 MED ORDER — KETOROLAC TROMETHAMINE 60 MG/2ML IM SOLN
60.0000 mg | Freq: Once | INTRAMUSCULAR | Status: AC
  Administered 2016-12-06: 60 mg via INTRAMUSCULAR

## 2016-12-06 MED ORDER — INDOMETHACIN 50 MG PO CAPS
50.0000 mg | ORAL_CAPSULE | Freq: Two times a day (BID) | ORAL | 1 refills | Status: DC | PRN
Start: 2016-12-06 — End: 2017-01-24

## 2016-12-06 NOTE — Progress Notes (Signed)
Subjective:  Patient ID: [name], female    DOB: Nov 25, 1975  Age: 41 y.o. MRN: [id number]  CC: No chief complaint on file.   HPI Kemani Heidel presents for severe R shoulder pain x 3 weeks, no injury   Outpatient Medications Prior to Visit  Medication Sig Dispense Refill  . ALPRAZolam (XANAX) 0.5 MG tablet Take 0.5 mg by mouth at bedtime as needed for anxiety.    . Amphetamine ER (ADZENYS XR-ODT) 18.8 MG TBED Take by mouth.    [name] amphetamine-dextroamphetamine (ADDERALL) 30 MG tablet Take 1 tablet by mouth daily.  0  . buPROPion (WELLBUTRIN XL) 300 MG 24 hr tablet Take 300 mg by mouth daily.    [name] omeprazole (PRILOSEC) 20 MG capsule Take 1 capsule (20 mg total) by mouth 2 (two) times daily before a meal. 60 capsule 3  . triamcinolone (NASACORT AQ) 55 MCG/ACT AERO nasal inhaler Place 2 sprays into the nose daily. 1 Inhaler 12   No facility-administered medications prior to visit.     ROS Review of Systems  Constitutional: Negative for activity change, appetite change, chills, fatigue and unexpected weight change.  HENT: Negative for congestion, mouth sores and sinus pressure.   Eyes: Negative for visual disturbance.  Respiratory: Negative for cough and chest tightness.   Gastrointestinal: Negative for abdominal pain and nausea.  Genitourinary: Negative for difficulty urinating, frequency and vaginal pain.  Musculoskeletal: Positive for arthralgias. Negative for back pain and gait problem.  Skin: Negative for pallor and rash.  Neurological: Negative for dizziness, tremors, weakness, numbness and headaches.  Psychiatric/Behavioral: Negative for confusion and sleep disturbance.    Objective:  BP (!) 138/98 (BP Location: Left Arm, Patient Position: Sitting, Cuff Size: Normal)   Pulse (!) 102   Temp 98.4 F (36.9 C) (Oral)   Ht 5\' 7"  (1.702 m)   Wt 171 lb 1.3 oz (77.6 kg)   SpO2 98%   BMI 26.79 kg/m   BP Readings from Last 3 Encounters:  12/06/16 (!) 138/98  11/24/16  136/90  11/02/16 122/90    Wt Readings from Last 3 Encounters:  12/06/16 171 lb 1.3 oz (77.6 kg)  11/24/16 171 lb (77.6 kg)  11/02/16 170 lb (77.1 kg)    Physical Exam  Constitutional: She appears well-developed. No distress.  HENT:  Head: Normocephalic.  Right Ear: External ear normal.  Left Ear: External ear normal.  Nose: Nose normal.  Mouth/Throat: Oropharynx is clear and moist.  Eyes: Conjunctivae are normal. Pupils are equal, round, and reactive to light. Right eye exhibits no discharge. Left eye exhibits no discharge.  Neck: Normal range of motion. Neck supple. No JVD present. No tracheal deviation present. No thyromegaly present.  Cardiovascular: Normal rate, regular rhythm and normal heart sounds.   Pulmonary/Chest: No stridor. No respiratory distress. She has no wheezes.  Abdominal: Soft. Bowel sounds are normal. She exhibits no distension and no mass. There is no tenderness. There is no rebound and no guarding.  Musculoskeletal: She exhibits tenderness. She exhibits no edema.  Lymphadenopathy:    She has no cervical adenopathy.  Neurological: She displays normal reflexes. No cranial nerve deficit. She exhibits normal muscle tone. Coordination normal.  Skin: No rash noted. No erythema.  Psychiatric: She has a normal mood and affect. Her behavior is normal. Judgment and thought content normal.   R trap and less  shoulder hurts w/ROM  Lab Results  Component Value Date   WBC 8.6 08/04/2015   HGB 13.2 08/04/2015   HCT  39.2 08/04/2015   PLT 318.0 08/04/2015   GLUCOSE 98 05/03/2014   ALT 9 08/04/2015   AST 12 08/04/2015   NA 136 05/03/2014   K 4.0 05/03/2014   CL 105 05/03/2014   CREATININE 0.9 05/03/2014   BUN 9 05/03/2014   CO2 25 05/03/2014   TSH 3.49 08/04/2015    Mm Digital Diagnostic Unilat R  Result Date: 10/16/2013 CLINICAL DATA:  Right breast calcifications on a recent baseline screening mammogram. EXAM: DIGITAL DIAGNOSTIC  RIGHT MAMMOGRAM COMPARISON:   Baseline screening mammogram dated 08/01/2013. ACR Breast Density Category c: The breast tissue is heterogeneously dense, which may obscure small masses. FINDINGS: Spot magnification views of the right breast demonstrate a large number of microcalcifications in a group spanning 7 cm in the upper inner right breast. These demonstrate dependent layering in the true lateral projection. IMPRESSION: Right breast benign milk of calcium.  No evidence of malignancy. RECOMMENDATION: Annual screening mammography beginning at age 40. I have discussed the findings and recommendations with the patient. Results were also provided in writing at the conclusion of the visit. If applicable, a reminder letter will be sent to the patient regarding the next appointment. BI-RADS CATEGORY  2: Benign finding(s). Electronically Signed   By: Gordan PaymentSteve  Reid M.D.   On: 10/16/2013 09:25    Assessment & Plan:   There are no diagnoses linked to this encounter. I am having Ms. Proud maintain her buPROPion, omeprazole, amphetamine-dextroamphetamine, ALPRAZolam, Amphetamine ER, and triamcinolone.  No orders of the defined types were placed in this encounter.    Follow-up: No Follow-up on file.  Sonda PrimesAlex Plotnikov, MD

## 2016-12-06 NOTE — Assessment & Plan Note (Signed)
Amy Carmela HurtStephenson is prescribing meds

## 2016-12-06 NOTE — Progress Notes (Deleted)
[facility] [address] [place], [place] [address] Phone: [phone] Subjective:    I'm seeing this patient by the request  of:    CC:   [id number]  [name] is a 41 y.o. female coming in with complaint of ***     Past Medical History:  Diagnosis Date  . Depression   . LBP (low back pain) 2011  . Vitamin B12 deficiency 2012  . Vitamin D deficiency 2012   Past Surgical History:  Procedure Laterality Date  . CESAREAN SECTION  1-08   Social History   Social History  . Marital status: Married    Spouse name: N/A  . Number of children: N/A  . Years of education: N/A   Social History Main Topics  . Smoking status: Never Smoker  . Smokeless tobacco: Never Used  . Alcohol use No  . Drug use: No  . Sexual activity: Yes   Other Topics Concern  . Not on file   Social History Narrative  . No narrative on file   Allergies  Allergen Reactions  . Strattera [Atomoxetine Hcl]     Aggressive behavior    Family History  Problem Relation Age of Onset  . Cancer Mother   . Kidney disease Other     Past medical history, social, surgical and family history all reviewed in electronic medical record.  No pertanent information unless stated regarding to the chief complaint.   Review of Systems:Review of systems updated and as accurate as of 12/06/16  No headache, visual changes, nausea, vomiting, diarrhea, constipation, dizziness, abdominal pain, skin rash, fevers, chills, night sweats, weight loss, swollen lymph nodes, body aches, joint swelling, muscle aches, chest pain, shortness of breath, mood changes.   Objective  There were no vitals taken for this visit. Systems examined below as of 12/06/16   General: No apparent distress alert and oriented x3 mood and affect normal, dressed appropriately.  HEENT: Pupils equal, extraocular movements intact  Respiratory: Patient's speak in full sentences and does not appear short of breath    Cardiovascular: No lower extremity edema, non tender, no erythema  Skin: Warm dry intact with no signs of infection or rash on extremities or on axial skeleton.  Abdomen: Soft nontender  Neuro: Cranial nerves II through XII are intact, neurovascularly intact in all extremities with 2+ DTRs and 2+ pulses.  Lymph: No lymphadenopathy of posterior or anterior cervical chain or axillae bilaterally.  Gait normal with good balance and coordination.  MSK:  Non tender with full range of motion and good stability and symmetric strength and tone of  elbows, wrist, hip, knee and ankles bilaterally.  Shoulder: Right Inspection reveals no abnormalities, atrophy or asymmetry. Palpation is normal with no tenderness over AC joint or bicipital groove. ROM is full in all planes passively. Rotator cuff strength normal throughout. signs of impingement with positive Neer and Hawkin's tests, but negative empty can sign. Speeds and Yergason's tests normal. No labral pathology noted with negative Obrien's, negative clunk and good stability. Normal scapular function observed. No painful arc and no drop arm sign. No apprehension sign  MSK US performed of: Right This study was ordered, performed, and interpreted by Terrilee Files D.O.  Shoulder:   Supraspinatus:  Appears normal on long and transverse views, Bursal bulge seen with shoulder abduction on impingement view. Infraspinatus:  Appears normal on long and transverse views. Significant increase in Doppler flow Subscapularis:  Appears normal on long and transverse views. Positive  bursa Teres Minor:  Appears normal on long and transverse views. AC joint:  Capsule undistended, no geyser sign. Glenohumeral Joint:  Appears normal without effusion. Glenoid Labrum:  Intact without visualized tears. Biceps Tendon:  Appears normal on long and transverse views, no fraying of tendon, tendon located in intertubercular groove, no subluxation with shoulder internal or  external rotation.  Impression: Subacromial bursitis  Procedure: Real-time Ultrasound Guided Injection of right glenohumeral joint Device: GE Logiq E  Ultrasound guided injection is preferred based studies that show increased duration, increased effect, greater accuracy, decreased procedural pain, increased response rate with ultrasound guided versus blind injection.  Verbal informed consent obtained.  Time-out conducted.  Noted no overlying erythema, induration, or other signs of local infection.  Skin prepped in a sterile fashion.  Local anesthesia: Topical Ethyl chloride.  With sterile technique and under real time ultrasound guidance:  Joint visualized.  23g 1  inch needle inserted posterior approach. Pictures taken for needle placement. Patient did have injection of 2 cc of 1% lidocaine, 2 cc of 0.5% Marcaine, and 1.0 cc of Kenalog 40 mg/dL. Completed without difficulty  Pain immediately resolved suggesting accurate placement of the medication.  Advised to call if fevers/chills, erythema, induration, drainage, or persistent bleeding.  Images permanently stored and available for review in the ultrasound unit.  Impression: Technically successful ultrasound guided injection.    Impression and Recommendations:     This case required medical decision making of moderate complexity.      Note: This dictation was prepared with Dragon dictation along with smaller phrase technology. Any transcriptional errors that result from this process are unintentional.

## 2016-12-06 NOTE — Assessment & Plan Note (Signed)
Indocin po Toradol inj now Dr Orlinda BlalockSmith Rice bag

## 2016-12-06 NOTE — Patient Instructions (Signed)
Rice bag 

## 2016-12-07 ENCOUNTER — Ambulatory Visit: Payer: BLUE CROSS/BLUE SHIELD | Admitting: Family Medicine

## 2016-12-10 NOTE — Assessment & Plan Note (Addendum)
Off Lexapro Wellbutrin

## 2016-12-10 NOTE — Assessment & Plan Note (Signed)
Adderall

## 2016-12-10 NOTE — Assessment & Plan Note (Signed)
Tramadol prn Dr Katrinka BlazingSmith

## 2016-12-13 DIAGNOSIS — Z1231 Encounter for screening mammogram for malignant neoplasm of breast: Secondary | ICD-10-CM | POA: Diagnosis not present

## 2016-12-23 DIAGNOSIS — Z79899 Other long term (current) drug therapy: Secondary | ICD-10-CM | POA: Diagnosis not present

## 2016-12-23 DIAGNOSIS — F902 Attention-deficit hyperactivity disorder, combined type: Secondary | ICD-10-CM | POA: Diagnosis not present

## 2016-12-23 DIAGNOSIS — F419 Anxiety disorder, unspecified: Secondary | ICD-10-CM | POA: Diagnosis not present

## 2017-01-15 ENCOUNTER — Encounter: Payer: Self-pay | Admitting: Family Medicine

## 2017-01-15 ENCOUNTER — Ambulatory Visit (INDEPENDENT_AMBULATORY_CARE_PROVIDER_SITE_OTHER): Payer: BLUE CROSS/BLUE SHIELD | Admitting: Family Medicine

## 2017-01-15 ENCOUNTER — Encounter: Payer: Self-pay | Admitting: Physician Assistant

## 2017-01-15 VITALS — BP 143/100 | HR 93 | Temp 99.0°F | Resp 16 | Ht 67.0 in | Wt 172.0 lb

## 2017-01-15 DIAGNOSIS — G5701 Lesion of sciatic nerve, right lower limb: Secondary | ICD-10-CM | POA: Diagnosis not present

## 2017-01-15 MED ORDER — PREDNISONE 10 MG PO TABS: ORAL_TABLET | ORAL | 0 refills | Status: DC

## 2017-01-15 NOTE — Progress Notes (Signed)
Chief Complaint  Patient presents with  . Back Pain    radiates down right leg, hurts to walk     HPI  Pt reports a pinching sensation in her buttocks that has been worsening over the past week and worse with running She states that after hurting she stretched her gluteus that did not help. The pain feels like it is beneath there gluteus max muscle She went running around 10 days ago She went running two times after and the pain was still there Now she has limp on the left side She would like to get help with the pain She stis all day at work and ice packs have not help   Past Medical History:  Diagnosis Date  . Depression   . LBP (low back pain) 2011  . Vitamin B12 deficiency 2012  . Vitamin D deficiency 2012    Current Outpatient Prescriptions  Medication Sig Dispense Refill  . ALPRAZolam (XANAX) 0.5 MG tablet Take 0.5 mg by mouth at bedtime as needed for anxiety.    . Amphetamine ER (ADZENYS XR-ODT) 18.8 MG TBED Take by mouth.    [name] amphetamine-dextroamphetamine (ADDERALL) 30 MG tablet Take 1 tablet by mouth daily.  0  . buPROPion (WELLBUTRIN XL) 300 MG 24 hr tablet Take 300 mg by mouth daily.    . Levonorgestrel-Ethinyl Estradiol (SEASONIQUE) 0.15-0.03 &0.01 MG tablet Take 1 tablet by mouth daily.    [name] omeprazole (PRILOSEC) 20 MG capsule Take 1 capsule (20 mg total) by mouth 2 (two) times daily before a meal. 60 capsule 3  . indomethacin (INDOCIN) 50 MG capsule Take 1 capsule (50 mg total) by mouth 2 (two) times daily as needed. Take pc (Patient not taking: Reported on 01/15/2017) 30 capsule 1  . predniSONE (DELTASONE) 10 MG tablet 6 tabs day 1, 5 tabs day 2, 4 tabs day 3, 3 tabs day 4, 2 tabs day 5, 1 tab day 6 21 tablet 0   No current facility-administered medications for this visit.     Allergies:  Allergies  Allergen Reactions  . Strattera [Atomoxetine Hcl]     Aggressive behavior     Past Surgical History:  Procedure Laterality Date  . CESAREAN SECTION  1-08      Social History   Social History  . Marital status: Married    Spouse name: N/A  . Number of children: N/A  . Years of education: N/A   Social History Main Topics  . Smoking status: Never Smoker  . Smokeless tobacco: Never Used  . Alcohol use No  . Drug use: No  . Sexual activity: Yes   Other Topics Concern  . None   Social History Narrative  . None    ROS Review of Systems See HPI Constitution: No fevers or chills No malaise No diaphoresis Skin: No rash or itching Eyes: no blurry vision, no double vision GU: no dysuria or hematuria Neuro: no dizziness or headaches  Objective: Vitals:   01/15/17 1549  BP: (!) 143/100  Pulse: 93  Resp: 16  Temp: 99 F (37.2 C)  TempSrc: Oral  SpO2: 99%  Weight: 172 lb (78 kg)  Height: 5\' 7"  (1.702 m)    Physical Exam  Constitutional: She is oriented to person, place, and time. She appears well-developed and well-nourished.  HENT:  Head: Normocephalic and atraumatic.  Cardiovascular: Normal rate, regular rhythm and normal heart sounds.   Pulmonary/Chest: Effort normal and breath sounds normal. No respiratory distress.  Musculoskeletal:  Right hip: She exhibits normal range of motion, normal strength, no tenderness, no bony tenderness, no swelling and no crepitus.       Left hip: She exhibits normal range of motion, normal strength, no tenderness, no bony tenderness and no swelling.       Right knee: She exhibits normal range of motion, no swelling, no effusion and no ecchymosis. No tenderness found.       Left knee: She exhibits normal range of motion, no swelling, no effusion and no ecchymosis. No tenderness found.       Lumbar back: She exhibits normal range of motion, no tenderness, no bony tenderness, no swelling, no edema, no deformity, no laceration and no pain.  Neurological: She is alert and oriented to person, place, and time. She displays normal reflexes. She exhibits normal muscle tone. Coordination  normal.    Assessment and Plan [name] was seen today for back pain.  Diagnoses and all orders for this visit:  Piriformis syndrome of right side -     predniSONE (DELTASONE) 10 MG tablet; 6 tabs day 1, 5 tabs day 2, 4 tabs day 3, 3 tabs day 4, 2 tabs day 5, 1 tab day 6     Sienna Stonehocker A Tequisha Maahs

## 2017-01-15 NOTE — Patient Instructions (Addendum)
Continue your topical relief meds such as aspercreme with lidocaine and complete the steroid taper   [facility] [phone] Please call Julieanne Cotton, Office Manager to set up an appointment Office Hours: Weekdays [address] [place], [place] [address] (Inside the [facility])    IF you received an x-ray today, you will receive an invoice from [facility]. Please contact [facility] at [phone] with questions or concerns regarding your invoice.   IF you received labwork today, you will receive an invoice from [facility]. Please contact [facility] at [phone] with questions or concerns regarding your invoice.   Our billing staff will not be able to assist you with questions regarding bills from these companies.  You will be contacted with the lab results as soon as they are available. The fastest way to get your results is to activate your My Chart account. Instructions are located on the last page of this paperwork. If you have not heard from [place] regarding the results in 2 weeks, please contact this office.      Piriformis Syndrome Rehab Ask your health care provider which exercises are safe for you. Do exercises exactly as told by your health care provider and adjust them as directed. It is normal to feel mild stretching, pulling, tightness, or discomfort as you do these exercises, but you should stop right away if you feel sudden pain or your pain gets worse.Do not begin these exercises until told by your health care provider. Stretching and range of motion exercises These exercises warm up your muscles and joints and improve the movement and flexibility of your hip and pelvis. These exercises also help to relieve pain, numbness, and tingling. Exercise A: Hip rotators  1. Lie on your back on a firm surface. 2. Pull your left / right knee toward your same shoulder with your left / right hand until your knee is pointing toward the ceiling.  Hold your left / right ankle with your other hand. 3. Keeping your knee steady, gently pull your left / right ankle toward your other shoulder until you feel a stretch in your buttocks. 4. Hold this position for __________ seconds. Repeat __________ times. Complete this stretch __________ times a day. Exercise B: Hip extensors 1. Lie on your back on a firm surface. Both of your legs should be straight. 2. Pull your left / right knee to your chest. Hold your leg in this position by holding onto the back of your thigh or the front of your knee. 3. Hold this position for __________ seconds. 4. Slowly return to the starting position. Repeat __________ times. Complete this stretch __________ times a day. Strengthening exercises These exercises build strength and endurance in your hip and thigh muscles. Endurance is the ability to use your muscles for a long time, even after they get tired. Exercise C: Straight leg raises ( hip abductors) 1. Lie on your side with your left / right leg in the top position. Lie so your head, shoulder, knee, and hip line up. Bend your bottom knee to help you balance. 2. Lift your top leg up 4-6 inches (10-15 cm), keeping your toes pointed straight ahead. 3. Hold this position for __________ seconds. 4. Slowly lower your leg to the starting position. Let your muscles relax completely. Repeat __________ times. Complete this exercise__________ times a day. Exercise D: Hip abductors and rotators, quadruped  1. Get on your hands and knees on a firm, lightly padded surface. Your hands should be directly below your  shoulders, and your knees should be directly below your hips. 2. Lift your left / right knee out to the side. Keep your knee bent. Do not twist your body. 3. Hold this position for __________ seconds. 4. Slowly lower your leg. Repeat __________ times. Complete this exercise__________ times a day. Exercise E: Straight leg raises ( hip extensors) 1. Lie on your  abdomen on a bed or a firm surface with a pillow under your hips. 2. Squeeze your buttock muscles and lift your left / right thigh off the bed. Do not let your back arch. 3. Hold this position for __________ seconds. 4. Slowly return to the starting position. Let your muscles relax completely before doing another repetition. Repeat __________ times. Complete this exercise__________ times a day. This information is not intended to replace advice given to you by your health care provider. Make sure you discuss any questions you have with your health care provider. Document Released: 07/12/2005 Document Revised: 03/16/2016 Document Reviewed: 06/24/2015 Elsevier Interactive Patient Education  [employer]2018 Elsevier Inc.

## 2017-01-16 ENCOUNTER — Encounter: Payer: Self-pay | Admitting: Internal Medicine

## 2017-01-18 ENCOUNTER — Telehealth: Payer: Self-pay | Admitting: Internal Medicine

## 2017-01-18 NOTE — Telephone Encounter (Signed)
Pt said that she had to go to the urgent care and cant walk and would like to know if Dr Katrinka Blazingsmith could work her in asap?

## 2017-01-21 ENCOUNTER — Encounter: Payer: Self-pay | Admitting: Internal Medicine

## 2017-01-21 NOTE — Telephone Encounter (Signed)
lmovm for pt to return call.  Dr. Berline Choughigby is not an option.  Dr. Katrinka BlazingSmith will work patient in the week of July 9.

## 2017-01-21 NOTE — Telephone Encounter (Signed)
Spoke to pt, unfortunatly she was not able to wait until July 9. advsed her to call [facility] & ask for Dr. Denyse Amassorey or Dr. TKarie Schwalbe

## 2017-01-21 NOTE — Telephone Encounter (Signed)
Sorry out of town.  Ask maybe Dr. Berline Choughigby or will try my best next 10 days.

## 2017-01-24 ENCOUNTER — Ambulatory Visit (INDEPENDENT_AMBULATORY_CARE_PROVIDER_SITE_OTHER): Payer: BLUE CROSS/BLUE SHIELD | Admitting: Family Medicine

## 2017-01-24 ENCOUNTER — Ambulatory Visit (INDEPENDENT_AMBULATORY_CARE_PROVIDER_SITE_OTHER): Payer: BLUE CROSS/BLUE SHIELD

## 2017-01-24 VITALS — BP 155/108 | HR 109 | Temp 98.6°F | Wt 172.0 lb

## 2017-01-24 DIAGNOSIS — M5136 Other intervertebral disc degeneration, lumbar region: Secondary | ICD-10-CM | POA: Diagnosis not present

## 2017-01-24 DIAGNOSIS — M5442 Lumbago with sciatica, left side: Secondary | ICD-10-CM

## 2017-01-24 DIAGNOSIS — M5441 Lumbago with sciatica, right side: Secondary | ICD-10-CM | POA: Diagnosis not present

## 2017-01-24 DIAGNOSIS — M48061 Spinal stenosis, lumbar region without neurogenic claudication: Secondary | ICD-10-CM

## 2017-01-24 MED ORDER — HYDROCODONE-ACETAMINOPHEN 5-325 MG PO TABS: 0.5000 | ORAL_TABLET | Freq: Four times a day (QID) | ORAL | 0 refills | Status: DC | PRN

## 2017-01-24 MED ORDER — METHOCARBAMOL 500 MG PO TABS: 500.0000 mg | ORAL_TABLET | Freq: Every evening | ORAL | 0 refills | Status: DC | PRN

## 2017-01-24 NOTE — Patient Instructions (Addendum)
Thank you for coming in today. Take norco at bedtime as needed for severe pain.  Use robaxin at bedtime for pain.  USe a heating pad.  Use a tens unit. Attend PT.   You should hear about MRI scheduling soon.    Come back or go to the emergency room if you notice new weakness new numbness problems walking or bowel or bladder problems.   Lumbosacral Strain Lumbosacral strain is an injury that causes pain in the lower back (lumbosacral spine). This injury usually occurs from overstretching the muscles or ligaments along your spine. A strain can affect one or more muscles or cord-like tissues that connect bones to other bones (ligaments). What are the causes? This condition may be caused by:  A hard, direct hit (blow) to the back.  Excessive stretching of the lower back muscles. This may result from: ? A fall. ? Lifting something heavy. ? Repetitive movements such as bending or crouching.  What increases the risk? The following factors may increase your risk of getting this condition:  Participating in sports or activities that involve: ? A sudden twist of the back. ? Pushing or pulling motions.  Being overweight or obese.  Having poor strength and flexibility, especially tight hamstrings or weak muscles in the back or abdomen.  Having too much of a curve in the lower back.  Having a pelvis that is tilted forward.  What are the signs or symptoms? The main symptom of this condition is pain in the lower back, at the site of the strain. Pain may extend (radiate) down one or both legs. How is this diagnosed? This condition is diagnosed based on:  Your symptoms.  Your medical history.  A physical exam. ? Your health care provider may push on certain areas of your back to determine the source of your pain. ? You may be asked to bend forward, backward, and side to side to assess the severity of your pain and your range of motion.  Imaging tests, such  as: ? X-rays. ? MRI.  How is this treated? Treatment for this condition may include:  Putting heat and cold on the affected area.  Medicines to help relieve pain and relax your muscles (muscle relaxants).  NSAIDs to help reduce swelling and discomfort.  When your symptoms improve, it is important to gradually return to your normal routine as soon as possible to reduce pain, avoid stiffness, and avoid loss of muscle strength. Generally, symptoms should improve within 6 weeks of treatment. However, recovery time varies. Follow these instructions at home: Managing pain, stiffness, and swelling   If directed, put ice on the injured area during the first 24 hours after your strain. ? Put ice in a plastic bag. ? Place a towel between your skin and the bag. ? Leave the ice on for 20 minutes, 2-3 times a day.  If directed, put heat on the affected area as often as told by your health care provider. Use the heat source that your health care provider recommends, such as a moist heat pack or a heating pad. ? Place a towel between your skin and the heat source. ? Leave the heat on for 20-30 minutes. ? Remove the heat if your skin turns bright red. This is especially important if you are unable to feel pain, heat, or cold. You may have a greater risk of getting burned. Activity  Rest and return to your normal activities as told by your health care provider. Ask your health care  provider what activities are safe for you.  Avoid activities that take a lot of energy for as long as told by your health care provider. General instructions  Take over-the-counter and prescription medicines only as told by your health care provider.  Donot drive or use heavy machinery while taking prescription pain medicine.  Do not use any products that contain nicotine or tobacco, such as cigarettes and e-cigarettes. If you need help quitting, ask your health care provider.  Keep all follow-up visits as told by  your health care provider. This is important. How is this prevented?  Use correct form when playing sports and lifting heavy objects.  Use good posture when sitting and standing.  Maintain a healthy weight.  Sleep on a mattress with medium firmness to support your back.  Be safe and responsible while being active to avoid falls.  Do at least 150 minutes of moderate-intensity exercise each week, such as brisk walking or water aerobics. Try a form of exercise that takes stress off your back, such as swimming or stationary cycling.  Maintain physical fitness, including: ? Strength. ? Flexibility. ? Cardiovascular fitness. ? Endurance. Contact a health care provider if:  Your back pain does not improve after 6 weeks of treatment.  Your symptoms get worse. Get help right away if:  Your back pain is severe.  You cannot stand or walk.  You have difficulty controlling when you urinate or when you have a bowel movement.  You feel nauseous or you vomit.  Your feet get very cold.  You have numbness, tingling, weakness, or problems using your arms or legs.  You develop any of the following: ? Shortness of breath. ? Dizziness. ? Pain in your legs. ? Weakness in your buttocks or legs. ? Discoloration of the skin on your toes or legs. This information is not intended to replace advice given to you by your health care provider. Make sure you discuss any questions you have with your health care provider. Document Released: 04/21/2005 Document Revised: 01/30/2016 Document Reviewed: 12/14/2015 Elsevier Interactive Patient Education  2017 [employer]

## 2017-01-25 NOTE — Progress Notes (Signed)
Subjective:    I'm seeing this patient as a consultation for:  [name], MD and PCP Plotnikov, Ebony QuintAleksei V, MD   CC: Back pain  HPI: [name] Has a 3 week history of intense pain in her bilateral low back worse on her right. The pain is also in the right lateral hip and buttocks. She denies any pain radiating to the legs but does note some numbness and tingling in the bilateral lateral calves and plantar feet as well as some mild subjective numbness in her groin. She denies any weakness or bowel or bladder dysfunction. She notes pain with walking however. Additionally she has intense pain when she stands from a seated position. She has tried some treatment including relative rest and heat and oral NSAIDs. This is only worked up a little bit. In the past she has not tolerated Flexeril well. She can't think of any specific injury that may have caused her pain but she notes that she did return to running before the pain started.  Past medical history, Surgical history, Family history not pertinant except as noted below, Social history, Allergies, and medications have been entered into the medical record, reviewed, and no changes needed.   Review of Systems: No headache, visual changes, nausea, vomiting, diarrhea, constipation, dizziness, abdominal pain, skin rash, fevers, chills, night sweats, weight loss, swollen lymph nodes, body aches, joint swelling, muscle aches, chest pain, shortness of breath, mood changes, visual or auditory hallucinations.   Objective:    Vitals:   01/24/17 1537  BP: (!) 155/108  Pulse: (!) 109  Temp: 98.6 F (37 C)   General: Well Developed, well nourished, and in no acute distress.  Neuro/Psych: Alert and oriented x3, extra-ocular muscles intact, able to move all 4 extremities, sensation grossly intact. Skin: Warm and dry, no rashes noted.  Respiratory: Not using accessory muscles, speaking in full sentences, trachea midline.  Cardiovascular: Pulses palpable,  no extremity edema. Abdomen: Does not appear distended. MSK:  L-spine nontender to spinal midline. Tender palpation bilateral lumbar paraspinal muscles. Lumbar range of motion pain with flexion but normal motion Significant pain and decreased motion with extension. Significantly limited motion and lateral flexion and rotation. Lower extremity strength is intact to toe standing heel standing squat. Negative slump test and straight-leg rest test bilaterally. Reflexes are equal and normal bilaterally Sensation is intact to light touch throughout  Hip: Normal-appearing bilaterally. Mildly tender to palpation at the right lateral greater trochanter and gluteus medius muscle groups especially as it inserts onto the sacrum and ilium. Decreased strength to hip abduction 4/5 right Antalgic gait present     No results found for this or any previous visit (from the past 24 hour(s)). Dg Lumbar Spine Complete  Result Date: 01/24/2017 CLINICAL DATA:  Back injury while running 3 weeks ago. Bilateral sciatic symptoms. No history of previous surgery. EXAM: LUMBAR SPINE - COMPLETE 4+ VIEW COMPARISON:  MRI of the lumbar spine of November 16, 2013 and lumbar spine series of May 23, 2013. FINDINGS: The lumbar vertebral bodies are preserved in height. There remains mild disc space narrowing at L4-5 and mild to moderate narrowing at L5-S1. There is no spondylolisthesis. There is no significant facet joint hypertrophy. The pedicles and transverse processes are intact. The observed portions of the sacrum are normal. IMPRESSION: There is no acute bony abnormality of the lumbar spine. There is mild disc space narrowing at L4-5 and at L5-S1. Electronically Signed   By: David  SwazilandJordan M.D.   On: 01/24/2017  16:32    Impression and Recommendations:    Assessment and Plan: 41 y.o. female with  Significant low back pain and right lateral hip pain. This is likely myofascial strain of the back core muscles and likely of  the gluteus medius muscle group. She does however have subjective numbness and tingling to the lateral calves and subjective groin numbness. However she does not have other signs concerning for cauda equina syndrome. Regarding side effect it's worthwhile to have a significant early workup. Plan for x-ray today as well as MRI in the near future. Treatment will consist of heat, TENS unit, NSAIDs, methocarbamol, and Norco. However the most important treatment will be physical therapy. Referral placed.  Additionally we spent a great deal of time discussing warning signs or symptoms for cauda equina syndrome.  Recheck following workup.   Orders Placed This Encounter  Procedures  . DG Lumbar Spine Complete    Standing Status:   Future    Number of Occurrences:   1    Standing Expiration Date:   03/27/2018    Order Specific Question:   Reason for Exam (SYMPTOM  OR DIAGNOSIS REQUIRED)    Answer:   eval lumbago with BL sciatica    Order Specific Question:   Is patient pregnant?    Answer:   No    Order Specific Question:   Preferred imaging location?    Answer:   [facility]    Order Specific Question:   Radiology Contrast Protocol - do NOT remove file path    Answer:   \\[email or web address]  . MR Lumbar Spine Wo Contrast    Standing Status:   Future    Standing Expiration Date:   03/27/2018    Order Specific Question:   Reason for Exam (SYMPTOM  OR DIAGNOSIS REQUIRED)    Answer:   eval lumbago with BL sciatica    Order Specific Question:   What is the patient's sedation requirement?    Answer:   No Sedation    Order Specific Question:   Does the patient have a pacemaker or implanted devices?    Answer:   No    Order Specific Question:   Preferred imaging location?    Answer:   [facility] (table limit-350lbs)    Order Specific Question:   Radiology Contrast Protocol - do NOT remove file path    Answer:    \\[email or web address]  . Ambulatory referral to Physical Therapy    Referral Priority:   Routine    Referral Type:   Physical Medicine    Referral Reason:   Specialty Services Required    Requested Specialty:   Physical Therapy   Meds ordered this encounter  Medications  . methocarbamol (ROBAXIN) 500 MG tablet    Sig: Take 1 tablet (500 mg total) by mouth at bedtime as needed for muscle spasms.    Dispense:  30 tablet    Refill:  0  . HYDROcodone-acetaminophen (NORCO/VICODIN) 5-325 MG tablet    Sig: Take 0.5-1 tablets by mouth every 6 (six) hours as needed.    Dispense:  15 tablet    Refill:  0    Discussed warning signs or symptoms. Please see discharge instructions. Patient expresses understanding.

## 2017-01-27 ENCOUNTER — Encounter: Payer: Self-pay | Admitting: Family Medicine

## 2017-01-31 ENCOUNTER — Ambulatory Visit (INDEPENDENT_AMBULATORY_CARE_PROVIDER_SITE_OTHER): Payer: BLUE CROSS/BLUE SHIELD

## 2017-01-31 DIAGNOSIS — M2578 Osteophyte, vertebrae: Secondary | ICD-10-CM | POA: Diagnosis not present

## 2017-01-31 DIAGNOSIS — M545 Low back pain: Secondary | ICD-10-CM | POA: Diagnosis not present

## 2017-01-31 DIAGNOSIS — M48061 Spinal stenosis, lumbar region without neurogenic claudication: Secondary | ICD-10-CM | POA: Diagnosis not present

## 2017-01-31 DIAGNOSIS — M5116 Intervertebral disc disorders with radiculopathy, lumbar region: Secondary | ICD-10-CM | POA: Diagnosis not present

## 2017-01-31 DIAGNOSIS — M5441 Lumbago with sciatica, right side: Secondary | ICD-10-CM

## 2017-01-31 DIAGNOSIS — M84454A Pathological fracture, pelvis, initial encounter for fracture: Secondary | ICD-10-CM

## 2017-01-31 DIAGNOSIS — M5442 Lumbago with sciatica, left side: Secondary | ICD-10-CM

## 2017-02-01 ENCOUNTER — Ambulatory Visit (INDEPENDENT_AMBULATORY_CARE_PROVIDER_SITE_OTHER): Payer: BLUE CROSS/BLUE SHIELD | Admitting: Family Medicine

## 2017-02-01 ENCOUNTER — Encounter: Payer: Self-pay | Admitting: Family Medicine

## 2017-02-01 DIAGNOSIS — M8448XA Pathological fracture, other site, initial encounter for fracture: Secondary | ICD-10-CM | POA: Diagnosis not present

## 2017-02-01 LAB — CBC
HCT: 42.3 % (ref 35.0–45.0)
Hemoglobin: 14 g/dL (ref 11.7–15.5)
MCH: 31.5 pg (ref 27.0–33.0)
MCHC: 33.1 g/dL (ref 32.0–36.0)
MCV: 95.3 fL (ref 80.0–100.0)
MPV: 9.3 fL (ref 7.5–12.5)
PLATELETS: 368 10*3/uL (ref 140–400)
RBC: 4.44 MIL/uL (ref 3.80–5.10)
RDW: 13 % (ref 11.0–15.0)
WBC: 7.9 10*3/uL (ref 3.8–10.8)

## 2017-02-01 MED ORDER — HYDROCODONE-ACETAMINOPHEN 5-325 MG PO TABS: 0.5000 | ORAL_TABLET | Freq: Four times a day (QID) | ORAL | 0 refills | Status: DC | PRN

## 2017-02-01 MED ORDER — CALCITONIN (SALMON) 200 UNIT/ACT NA SOLN: 1.0000 | Freq: Every day | NASAL | 11 refills | Status: DC

## 2017-02-01 NOTE — Progress Notes (Signed)
[name] is a 41 y.o. female who presents to [facility] today for follow-up back pain. Patient was seen July 2 for right low back pain with radicular symptoms. She had an MRI on the ninth which showed a right-sided sacral insufficiency fracture as well as some neuro impingement at the bilateral L5 nerve roots right worse than left.  Patient notes continued right-sided low back pain worse with activity. She notes the radicular symptoms have improved some. She denies any weakness or numbness or new bowel or bladder dysfunction.    Past Medical History:  Diagnosis Date  . Depression   . LBP (low back pain) 2011  . Vitamin B12 deficiency 2012  . Vitamin D deficiency 2012   Past Surgical History:  Procedure Laterality Date  . CESAREAN SECTION  1-08   Social History  Substance Use Topics  . Smoking status: Former Smoker    Packs/day: 0.30    Years: 10.00    Types: Cigarettes  . Smokeless tobacco: Never Used  . Alcohol use No     ROS:  As above   Medications: Current Outpatient Prescriptions  Medication Sig Dispense Refill  . ALPRAZolam (XANAX) 0.5 MG tablet Take 0.5 mg by mouth at bedtime as needed for anxiety.    . Amphetamine ER (ADZENYS XR-ODT) 18.8 MG TBED Take by mouth.    [name] amphetamine-dextroamphetamine (ADDERALL) 30 MG tablet Take 1 tablet by mouth daily.  0  . buPROPion (WELLBUTRIN XL) 300 MG 24 hr tablet Take 300 mg by mouth daily.    [name] HYDROcodone-acetaminophen (NORCO/VICODIN) 5-325 MG tablet Take 0.5-1 tablets by mouth every 6 (six) hours as needed. 15 tablet 0  . Levonorgestrel-Ethinyl Estradiol (SEASONIQUE) 0.15-0.03 &0.01 MG tablet Take 1 tablet by mouth daily.    . methocarbamol (ROBAXIN) 500 MG tablet Take 1 tablet (500 mg total) by mouth at bedtime as needed for muscle spasms. 30 tablet 0  . omeprazole (PRILOSEC) 20 MG capsule Take 1 capsule (20 mg total) by mouth 2 (two) times daily before a meal. 60 capsule  3  . calcitonin, salmon, (MIACALCIN) 200 UNIT/ACT nasal spray Place 1 spray into alternate nostrils daily. 3.7 mL 11   No current facility-administered medications for this visit.    Allergies  Allergen Reactions  . Strattera [Atomoxetine Hcl]     Aggressive behavior      Exam:  BP (!) 156/106   Pulse 91   Wt 173 lb (78.5 kg)   LMP 01/31/2017   SpO2 98%   BMI 27.10 kg/m  General: Well Developed, well nourished, and in no acute distress.  Neuro/Psych: Alert and oriented x3, extra-ocular muscles intact, able to move all 4 extremities, sensation grossly intact. Skin: Warm and dry, no rashes noted.  Respiratory: Not using accessory muscles, speaking in full sentences, trachea midline.  Cardiovascular: Pulses palpable, no extremity edema. Abdomen: Does not appear distended. MSK: L spine: Nontender to midline. Tender palpation right SI joint region. Patient moves stiffly and has decreased lumbar range of motion. Lower extreme any sensation is intact.   No results found for this or any previous visit (from the past 48 hour(s)). Mr Lumbar Spine Wo Contrast  Result Date: 01/31/2017 CLINICAL DATA:  Initial evaluation for low back pain with right buttock and leg pain, numbness in calf in groin. EXAM: MRI LUMBAR SPINE WITHOUT CONTRAST TECHNIQUE: Multiplanar, multisequence MR imaging of the lumbar spine was performed. No intravenous contrast was administered. COMPARISON:  Prior radiograph from 01/24/2017. FINDINGS:  Segmentation: Normal segmentation. Lowest well-formed disc is labeled the L5-S1 level. Alignment: Vertebral bodies normally aligned with preservation of the normal lumbar lordosis. No listhesis. Vertebrae: Vertebral body heights are maintained without evidence for acute or chronic vertebral body fracture. There is abnormal T1 hypointense, T2/STIR hyperintense signal intensity partially visualize within the right sacral ala (series 4, image 1). Probable superimposed linear fracture line  (series 6, image 41). Finding suspicious for acute sacral fracture, likely insufficiency in nature. Signal intensity within the vertebral body bone marrow is otherwise normal. Chronic reactive endplate changes noted about the L5-S1 interspace. No discrete or worrisome osseous lesions. Conus medullaris: Extends to the L1 level and appears normal. Paraspinal and other soft tissues: Paraspinous soft tissues within normal limits. Few small T2 hyperintense cyst noted within knee left kidney. Visualized visceral structures otherwise unremarkable. Disc levels: No significant degenerative changes are seen through the L3-4 level. L4-5: Diffuse degenerative disc bulge with disc desiccation. Small central disc protrusion with slight caudad angulation. Associated annular fissure. No significant canal or foraminal stenosis. L5-S1: Chronic degenerative intervertebral disc space narrowing with diffuse disc bulge and reactive marginal endplate osteophytes. Broad posterior disc osteophyte complex, a centric to the right, and extending laterally towards the right neural foramen (series 5, image 35). Mild flattening and effacement of the right lateral recess without significant canal stenosis. No frank neural impingement. Mild bilateral L5 foraminal narrowing due to degenerative disc osteophyte without frank neural impingement. IMPRESSION: 1. Acute insufficiency fracture involving the right sacral ala, partially visualized on this exam. 2. Small central disc protrusion at L4-5 without stenosis or neural impingement. 3. Right eccentric disc osteophyte complex at L5-S1 with resultant mild right lateral recess stenosis and mild bilateral L5 foraminal narrowing. Electronically Signed   By: Rise MuBenjamin  McClintock M.D.   On: 01/31/2017 21:45      Assessment and Plan: 41 y.o. female with sacral insufficiency fracture. Plan to treat with Norco and calcitonin nasal spray some early to vertebral compression fracture. Additionally we'll use  lumbar bracing. Workup presumed osteoporosis with bone density scan CBC and metabolic panel TSH parathyroid hormone and vitamin D. Recheck in 1-2 weeks.  [facility] researched [facility] [place].    Orders Placed This Encounter  Procedures  . DG Bone Density    Standing Status:   Future    Standing Expiration Date:   04/04/2018    Order Specific Question:   Reason for exam:    Answer:   eval sacral insf fracture and bone mineral density    Order Specific Question:   Preferred imaging location?    Answer[facility]  . CBC  . COMPLETE METABOLIC PANEL WITH GFR  . TSH  . PTH, Intact and Calcium  . VITAMIN D 25 Hydroxy (Vit-D Deficiency, Fractures)   Meds ordered this encounter  Medications  . calcitonin, salmon, (MIACALCIN) 200 UNIT/ACT nasal spray    Sig: Place 1 spray into alternate nostrils daily.    Dispense:  3.7 mL    Refill:  11  . HYDROcodone-acetaminophen (NORCO/VICODIN) 5-325 MG tablet    Sig: Take 0.5-1 tablets by mouth every 6 (six) hours as needed.    Dispense:  15 tablet    Refill:  0    Discussed warning signs or symptoms. Please see discharge instructions. Patient expresses understanding.

## 2017-02-01 NOTE — Patient Instructions (Signed)
Thank you for coming in today. Use norco sparingly.  Attend PT.  Use calcitonin nasal spray.  USe brace as needed.  Recheck in 2 weeks.

## 2017-02-02 ENCOUNTER — Ambulatory Visit: Payer: BLUE CROSS/BLUE SHIELD | Attending: Family Medicine | Admitting: Physical Therapy

## 2017-02-02 ENCOUNTER — Encounter: Payer: Self-pay | Admitting: Physical Therapy

## 2017-02-02 DIAGNOSIS — M5416 Radiculopathy, lumbar region: Secondary | ICD-10-CM | POA: Diagnosis not present

## 2017-02-02 DIAGNOSIS — M62830 Muscle spasm of back: Secondary | ICD-10-CM | POA: Insufficient documentation

## 2017-02-02 DIAGNOSIS — M5441 Lumbago with sciatica, right side: Secondary | ICD-10-CM | POA: Diagnosis not present

## 2017-02-02 LAB — COMPLETE METABOLIC PANEL WITH GFR
ALT: 10 U/L (ref 6–29)
AST: 13 U/L (ref 10–30)
Albumin: 4.4 g/dL (ref 3.6–5.1)
Alkaline Phosphatase: 65 U/L (ref 33–115)
BUN: 14 mg/dL (ref 7–25)
CHLORIDE: 102 mmol/L (ref 98–110)
CO2: 21 mmol/L (ref 20–31)
Calcium: 9.5 mg/dL (ref 8.6–10.2)
Creat: 1.02 mg/dL (ref 0.50–1.10)
GFR, EST AFRICAN AMERICAN: 80 mL/min (ref >=60)
GFR, EST NON AFRICAN AMERICAN: 69 mL/min (ref >=60)
Glucose, Bld: 86 mg/dL (ref 65–99)
POTASSIUM: 4.1 mmol/L (ref 3.5–5.3)
Sodium: 138 mmol/L (ref 135–146)
Total Bilirubin: 0.4 mg/dL (ref 0.2–1.2)
Total Protein: 7 g/dL (ref 6.1–8.1)

## 2017-02-02 LAB — PTH, INTACT AND CALCIUM
CALCIUM: 9.5 mg/dL (ref 8.6–10.2)
PTH: 66 pg/mL — AB (ref 14–64)

## 2017-02-02 LAB — VITAMIN D 25 HYDROXY (VIT D DEFICIENCY, FRACTURES): Vit D, 25-Hydroxy: 28 ng/mL — ABNORMAL LOW (ref 30–100)

## 2017-02-02 LAB — TSH: TSH: 2.8 mIU/L

## 2017-02-02 NOTE — Therapy (Signed)
[facility] [address] [place], [place], [address] Phone: [phone]   Fax:  [phone]  Physical Therapy Evaluation  Patient Details  Name: [name] MRN: [id number] Date of Birth: August 04, 1975 Referring Provider: Clementeen Graham  Encounter Date: 02/02/2017    Past Medical History:  Diagnosis Date  . Depression   . LBP (low back pain) 2011  . Vitamin B12 deficiency 2012  . Vitamin D deficiency 2012    Past Surgical History:  Procedure Laterality Date  . CESAREAN SECTION  1-08    There were no vitals filed for this visit.       Subjective Assessment - 02/02/17 1627    Subjective Patient reports that she is unsure of a specific cause.  She reports that she started running about a mile about 6 weeks ago and felt a pinch in the right butt cheek.  She had an x-ray that showed an insufficiency fracture in the sacrum.  Taking Advil.   Limitations Walking;House hold activities;Lifting   Patient Stated Goals have less pain   Currently in Pain? Yes   Pain Score 2    Pain Location Buttocks   Pain Orientation Left   Pain Descriptors / Indicators Sharp   Pain Radiating Towards tingling in left foot and calf, intermittant numbness in groin area, also pain into lumbar area (right a little more than left), and pt states that laying down she can dorsiflex right foot and pain shoots through her lower back but the right leg itself doesn't hurt.    Aggravating Factors  Placing her weight on right leg, sit to stand, bending over. Pain gets to 7/10   Pain Relieving Factors OTC pain medication, rest, laying down, TENS unit - 2/10 at best.    Effect of Pain on Daily Activities limits all ADL's            [facility] PT Assessment - 02/02/17 0001      Assessment   Medical Diagnosis back pain   Referring Provider Clementeen Graham   Onset Date/Surgical Date 01/03/17   Next MD Visit 2 weeks   Prior Therapy no     Precautions   Precautions None     Balance Screen   Has the patient fallen in the past 6 months No   Has the patient had a decrease in activity level because of a fear of falling?  No   Is the patient reluctant to leave their home because of a fear of falling?  No     Home Environment   Additional Comments has stairs, does her own housework and yardwork     Prior Function   Level of Independence Independent   Vocation Full time employment   Vocation Requirements on feet most of the day 9 hours   Leisure would like to get back to running     Sensation   Additional Comments LE dermatomes are intact and symmetrical     Posture/Postural Control   Posture Comments fwd head, rounded shoulders     ROM / Strength   AROM / PROM / Strength AROM;Strength     AROM   Overall AROM Comments Lumbar ROM decreased 50% for flexion and sidebending, decreased 75% for extension     Strength   Overall Strength Comments 4-/5 on the left due to pain with standing on the right side     Flexibility   Soft Tissue Assessment /Muscle Length yes   Hamstrings Tight hamstrings and  pain at end range, right more than left   Quadriceps no restriction   Piriformis Right piriformis was painful with stretching     Palpation   Palpation comment she had some tightness in the lumbar area and very tender in the right SI area     Special Tests    Special Tests Lumbar   Lumbar Tests Slump Test     Slump test   Findings Negative   Side Right     Transfers   Comments Pt reports pain with sit to stand transfers     Ambulation/Gait   Gait Comments Forward flexed trunk, guarded with motion, and antalgic gait on left after exam.              Objective measurements completed on examination: See above findings.                  PT Education - 02/02/17 1707    Education provided Yes   Education Details Wms flexion and Kegel's   Person(s) Educated Patient   Methods Explanation;Demonstration;Handout    Comprehension Verbalized understanding          PT Short Term Goals - 02/02/17 1708      PT SHORT TERM GOAL #1   Title independent with initial HEP   Time 2   Period Weeks   Status New           PT Long Term Goals - 02/02/17 1735      PT LONG TERM GOAL #1   Title Decrease pain with activity to 3/10   Time 8   Period Weeks   Status New     PT LONG TERM GOAL #2   Title Increase lumbar AROM by 25% in all directions.    Time 8   Period Weeks   Status New     PT LONG TERM GOAL #3   Title Independent with advanced HEP, return to the gym, or jogging 1 mile with pain less than 5/10.    Time 8   Period Weeks   Status New     PT LONG TERM GOAL #4   Title Pt will be able to walk without an antalgic gait.    Time 8   Period Weeks   Status New                Plan - 02/02/17 1725    Clinical Impression Statement Pt presents with a lot of pain and symptoms in her LE and back. Her pain is easily irritated. She reports pain with flexion and extension but ROM is much more limited with extension and she stated that distraction was not painful. She had a negative SLUMP test but reports tingling and numbness in her L LE and sometimes also in her groin area. Appears to have a flexion preference. Pt is very motivated and wants to get back to running.    History and Personal Factors relevant to plan of care: Pt is being tested for osteoporosis.    Clinical Presentation Unstable   Clinical Decision Making Moderate   Rehab Potential Good   PT Frequency 2x / week   PT Duration 8 weeks   PT Treatment/Interventions Cryotherapy;Iontophoresis 4mg /ml Dexamethasone;Electrical Stimulation;Moist Heat;Traction;Stair training;Therapeutic exercise;Therapeutic activities;Manual techniques;Passive range of motion   PT Next Visit Plan Review HEP and determine if flexion exercises have been helpful. Check SIJ special tests. Work on strengthening and decreasing pain with modalities.    PT Home  Exercise Plan William's flexion  exercises and Kegels    Consulted and Agree with Plan of Care Patient      Patient will benefit from skilled therapeutic intervention in order to improve the following deficits and impairments:  Abnormal gait, Decreased range of motion, Difficulty walking, Pain, Decreased activity tolerance, Decreased mobility, Decreased strength, Postural dysfunction, Increased muscle spasms  Visit Diagnosis: Acute right-sided low back pain with right-sided sciatica  Muscle spasm of back  Radiculopathy, lumbar region     Problem List Patient Active Problem List   Diagnosis Date Noted  . Sacral insufficiency fracture 02/01/2017  . Shoulder pain, right 12/06/2016  . Allergic rhinitis 11/24/2016  . Acute sinus infection 11/24/2016  . Attention deficit 04/24/2015  . Large breasts 10/01/2013  . Scalp cyst 07/03/2013  . Sacroiliac joint dysfunction of left side 04/11/2013  . Nonallopathic lesion of cervical region 04/11/2013  . Nonallopathic lesion of lumbosacral region 04/11/2013  . Nonallopathic lesion of thoracic region 04/11/2013  . Jaw dislocation 02/21/2012  . Plantar fasciitis 02/09/2012  . Abdominal tenderness, epigastric 07/01/2011  . GERD (gastroesophageal reflux disease) 07/01/2011  . Preventative health care 01/21/2011  . Low back pain 12/12/2010  . Paresthesia 12/12/2010  . Fatigue 12/12/2010  . Vitamin B 12 deficiency 12/12/2010  . Vitamin D deficiency 12/12/2010  . Depression 12/12/2010    Jearld Lesch., PT 02/02/2017, 5:47 PM  [facility] [address] [place], [place], [address] Phone: [phone]   Fax:  [phone]  Name: [name] MRN: [id number] Date of Birth: 1975-10-18

## 2017-02-08 ENCOUNTER — Ambulatory Visit: Payer: BLUE CROSS/BLUE SHIELD | Admitting: Physical Therapy

## 2017-02-08 ENCOUNTER — Encounter: Payer: Self-pay | Admitting: Physical Therapy

## 2017-02-08 DIAGNOSIS — M5441 Lumbago with sciatica, right side: Secondary | ICD-10-CM

## 2017-02-08 DIAGNOSIS — M62830 Muscle spasm of back: Secondary | ICD-10-CM

## 2017-02-08 DIAGNOSIS — M5416 Radiculopathy, lumbar region: Secondary | ICD-10-CM | POA: Diagnosis not present

## 2017-02-08 NOTE — Therapy (Signed)
[facility] [address] [place], [place], [address] Phone: [phone]   Fax:  [phone]  Physical Therapy Treatment  Patient Details  Name: [name] MRN: [id number] Date of Birth: 09/18/1975 Referring Provider: Clementeen GrahamEvan Corey  Encounter Date: 02/08/2017      PT End of Session - 02/08/17 1218    Visit Number 2   Date for PT Re-Evaluation 04/01/17   PT Start Time 1150   PT Stop Time 1245   PT Time Calculation (min) 55 min      Past Medical History:  Diagnosis Date  . Depression   . LBP (low back pain) 2011  . Vitamin B12 deficiency 2012  . Vitamin D deficiency 2012    Past Surgical History:  Procedure Laterality Date  . CESAREAN SECTION  1-08    There were no vitals filed for this visit.      Subjective Assessment - 02/08/17 1153    Subjective doing stretching and helping but c/o numbness down Left leg/intermittant   Currently in Pain? Yes   Pain Score 3    Pain Location Buttocks                         [facility] Adult PT Treatment/Exercise - 02/08/17 0001      Exercises   Exercises Lumbar     Lumbar Exercises: Supine   Other Supine Lumbar Exercises bridge with ball, KTC, bridge with ball squeeze, SL bridge on ball, clam shell red tband, SLR with abd and red tband     Modalities   Modalities Moist Heat;Iontophoresis;Electrical Stimulation     Moist Heat Therapy   Number Minutes Moist Heat 15 Minutes   Moist Heat Location Lumbar Spine     Electrical Stimulation   Electrical Stimulation Location LB/SI   Electrical Stimulation Action IFC   Electrical Stimulation Goals Pain     Iontophoresis   Type of Iontophoresis Dexamethasone   Location RI SI   Dose 1.2 cc   Time #1 4 hour leav on patch     Manual Therapy   Manual Therapy Joint mobilization;Soft tissue mobilization;Passive ROM;Manual Traction   Joint Mobilization SI = but very tender RT SI   Soft tissue mobilization  LB and SI   Passive ROM LE and trunk   Manual Traction with sheet                  PT Short Term Goals - 02/08/17 1220      PT SHORT TERM GOAL #1   Title independent with initial HEP   Status Achieved           PT Long Term Goals - 02/02/17 1735      PT LONG TERM GOAL #1   Title Decrease pain with activity to 3/10   Time 8   Period Weeks   Status New     PT LONG TERM GOAL #2   Title Increase lumbar AROM by 25% in all directions.    Time 8   Period Weeks   Status New     PT LONG TERM GOAL #3   Title Independent with advanced HEP, return to the gym, or jogging 1 mile with pain less than 5/10.    Time 8   Period Weeks   Status New     PT LONG TERM GOAL #4   Title Pt will be able to walk without an antalgic gait.  Time 8   Period Weeks   Status New               Plan - 02/08/17 1219    Clinical Impression Statement tolerated stab ex with some increased pain, inconsistant c/o numbess which had NE with manual traction. pt very tight in LE and very tender RT SI- pressure triggered muscle spasm   PT Next Visit Plan assess and progress      Patient will benefit from skilled therapeutic intervention in order to improve the following deficits and impairments:  Abnormal gait, Decreased range of motion, Difficulty walking, Pain, Decreased activity tolerance, Decreased mobility, Decreased strength, Postural dysfunction, Increased muscle spasms  Visit Diagnosis: Acute right-sided low back pain with right-sided sciatica  Muscle spasm of back  Radiculopathy, lumbar region     Problem List Patient Active Problem List   Diagnosis Date Noted  . Sacral insufficiency fracture 02/01/2017  . Shoulder pain, right 12/06/2016  . Allergic rhinitis 11/24/2016  . Acute sinus infection 11/24/2016  . Attention deficit 04/24/2015  . Large breasts 10/01/2013  . Scalp cyst 07/03/2013  . Sacroiliac joint dysfunction of left side 04/11/2013  . Nonallopathic  lesion of cervical region 04/11/2013  . Nonallopathic lesion of lumbosacral region 04/11/2013  . Nonallopathic lesion of thoracic region 04/11/2013  . Jaw dislocation 02/21/2012  . Plantar fasciitis 02/09/2012  . Abdominal tenderness, epigastric 07/01/2011  . GERD (gastroesophageal reflux disease) 07/01/2011  . Preventative health care 01/21/2011  . Low back pain 12/12/2010  . Paresthesia 12/12/2010  . Fatigue 12/12/2010  . Vitamin B 12 deficiency 12/12/2010  . Vitamin D deficiency 12/12/2010  . Depression 12/12/2010    PAYSEUR,ANGIE PTA 02/08/2017, 12:21 PM  [facility] [place] [facility] [address] [place], [place], [address] Phone: [phone]   Fax:  [phone]  Name: [name] MRN: [id number] Date of Birth: March 25, 1976

## 2017-02-15 ENCOUNTER — Ambulatory Visit: Payer: BLUE CROSS/BLUE SHIELD | Admitting: Family Medicine

## 2017-02-15 DIAGNOSIS — Z0189 Encounter for other specified special examinations: Secondary | ICD-10-CM

## 2017-03-22 ENCOUNTER — Encounter: Payer: Self-pay | Admitting: Physician Assistant

## 2017-03-22 ENCOUNTER — Ambulatory Visit (INDEPENDENT_AMBULATORY_CARE_PROVIDER_SITE_OTHER): Payer: BLUE CROSS/BLUE SHIELD | Admitting: Physician Assistant

## 2017-03-22 VITALS — BP 145/103 | HR 101 | Temp 98.7°F | Resp 16 | Ht 66.0 in | Wt 170.2 lb

## 2017-03-22 DIAGNOSIS — M25521 Pain in right elbow: Secondary | ICD-10-CM | POA: Diagnosis not present

## 2017-03-22 DIAGNOSIS — T148XXA Other injury of unspecified body region, initial encounter: Secondary | ICD-10-CM | POA: Diagnosis not present

## 2017-03-22 NOTE — Progress Notes (Signed)
[name]  MRN: [id number] DOB: 01/17/1976  PCP: Tresa Garter, MD  Subjective:  Pt is a 41 year old female who presents to clinic for right arm pain x 1 days. She was in an argument with her 42 year old daughter this morning. She went to spank her daughter when her right forearm hit the side of the bed. C/o tenderness, swelling, weakness.  Denies bruising  Review of Systems  Musculoskeletal: Positive for arthralgias (right arm). Negative for joint swelling.  Skin: Negative.   Neurological: Positive for weakness. Negative for numbness.    Patient Active Problem List   Diagnosis Date Noted  . Sacral insufficiency fracture 02/01/2017  . Shoulder pain, right 12/06/2016  . Allergic rhinitis 11/24/2016  . Acute sinus infection 11/24/2016  . Attention deficit 04/24/2015  . Large breasts 10/01/2013  . Scalp cyst 07/03/2013  . Sacroiliac joint dysfunction of left side 04/11/2013  . Nonallopathic lesion of cervical region 04/11/2013  . Nonallopathic lesion of lumbosacral region 04/11/2013  . Nonallopathic lesion of thoracic region 04/11/2013  . Jaw dislocation 02/21/2012  . Plantar fasciitis 02/09/2012  . Abdominal tenderness, epigastric 07/01/2011  . GERD (gastroesophageal reflux disease) 07/01/2011  . Preventative health care 01/21/2011  . Low back pain 12/12/2010  . Paresthesia 12/12/2010  . Fatigue 12/12/2010  . Vitamin B 12 deficiency 12/12/2010  . Vitamin D deficiency 12/12/2010  . Depression 12/12/2010    Current Outpatient Prescriptions on File Prior to Visit  Medication Sig Dispense Refill  . ALPRAZolam (XANAX) 0.5 MG tablet Take 0.5 mg by mouth at bedtime as needed for anxiety.    [name] amphetamine-dextroamphetamine (ADDERALL) 30 MG tablet Take 1 tablet by mouth daily.  0  . buPROPion (WELLBUTRIN XL) 300 MG 24 hr tablet Take 300 mg by mouth daily.    . Levonorgestrel-Ethinyl Estradiol (SEASONIQUE) 0.15-0.03 &0.01 MG tablet Take 1 tablet by mouth daily.    .  Amphetamine ER (ADZENYS XR-ODT) 18.8 MG TBED Take by mouth.    . calcitonin, salmon, (MIACALCIN) 200 UNIT/ACT nasal spray Place 1 spray into alternate nostrils daily. (Patient not taking: Reported on 03/22/2017) 3.7 mL 11  . HYDROcodone-acetaminophen (NORCO/VICODIN) 5-325 MG tablet Take 0.5-1 tablets by mouth every 6 (six) hours as needed. (Patient not taking: Reported on 03/22/2017) 15 tablet 0  . methocarbamol (ROBAXIN) 500 MG tablet Take 1 tablet (500 mg total) by mouth at bedtime as needed for muscle spasms. (Patient not taking: Reported on 03/22/2017) 30 tablet 0  . omeprazole (PRILOSEC) 20 MG capsule Take 1 capsule (20 mg total) by mouth 2 (two) times daily before a meal. (Patient not taking: Reported on 03/22/2017) 60 capsule 3   No current facility-administered medications on file prior to visit.     Allergies  Allergen Reactions  . Strattera [Atomoxetine Hcl]     Aggressive behavior      Objective:  BP (!) 145/103   Pulse (!) 101   Temp 98.7 F (37.1 C) (Oral)   Resp 16   Ht 5\' 6"  (1.676 m)   Wt 170 lb 3.2 oz (77.2 kg)   SpO2 97%   BMI 27.47 kg/m   Physical Exam  Constitutional: She is oriented to person, place, and time and well-developed, well-nourished, and in no distress. No distress.  Cardiovascular: Normal rate, regular rhythm and normal heart sounds.   Musculoskeletal:       Right forearm: She exhibits tenderness (muscle belly ) and bony tenderness (mild). She exhibits no swelling, no edema and no deformity.  No bruising.   Neurological: She is alert and oriented to person, place, and time. She has normal sensation and normal strength. GCS score is 15.  Skin: Skin is warm and dry.  Psychiatric: Memory, affect and judgment normal. Her mood appears anxious.  Vitals reviewed.   Assessment and Plan :  1. Arthralgia of right upper arm 2. Bruise of muscle - Low suspicion of fracture. Pt left abruptly once she was told low likelihood of fx. Advised RICE. RTC in 2-3  weeks if no improvement. She understands and agrees with plan.   Marco Collie, PA-C  Primary [facility] at [facility] 03/22/2017 2:24 PM

## 2017-03-22 NOTE — Patient Instructions (Addendum)
  Put ice in a plastic bag. Place a towel between your skin and the bag or between your plaster splint and the bag. Leave the ice on for 20 minutes, 2-3 times a day. Ibuprofen and/or Tylenol for pain. Keep the area elevated above the level of your heart. Do this 2-3 times a day until swelling improves.   Thank you for coming in today. I hope you feel we met your needs.  Feel free to call PCP if you have any questions or further requests.  Please consider signing up for MyChart if you do not already have it, as this is a great way to communicate with me.  Best,  Whitney McVey, PA-C    IF you received an x-ray today, you will receive an invoice from [facility]. Please contact [facility] at [phone] with questions or concerns regarding your invoice.   IF you received labwork today, you will receive an invoice from [facility]. Please contact [facility] at [phone] with questions or concerns regarding your invoice.   Our billing staff will not be able to assist you with questions regarding bills from these companies.  You will be contacted with the lab results as soon as they are available. The fastest way to get your results is to activate your My Chart account. Instructions are located on the last page of this paperwork. If you have not heard from [place] regarding the results in 2 weeks, please contact this office.

## 2017-04-15 DIAGNOSIS — F902 Attention-deficit hyperactivity disorder, combined type: Secondary | ICD-10-CM | POA: Diagnosis not present

## 2017-04-15 DIAGNOSIS — Z79899 Other long term (current) drug therapy: Secondary | ICD-10-CM | POA: Diagnosis not present

## 2017-06-17 DIAGNOSIS — Z23 Encounter for immunization: Secondary | ICD-10-CM | POA: Diagnosis not present

## 2017-07-07 ENCOUNTER — Ambulatory Visit (INDEPENDENT_AMBULATORY_CARE_PROVIDER_SITE_OTHER): Payer: BLUE CROSS/BLUE SHIELD

## 2017-07-07 ENCOUNTER — Encounter: Payer: Self-pay | Admitting: Family Medicine

## 2017-07-07 ENCOUNTER — Ambulatory Visit: Payer: BLUE CROSS/BLUE SHIELD | Admitting: Family Medicine

## 2017-07-07 ENCOUNTER — Other Ambulatory Visit: Payer: Self-pay

## 2017-07-07 VITALS — BP 126/80 | HR 97 | Temp 98.6°F | Ht 66.0 in | Wt 173.4 lb

## 2017-07-07 DIAGNOSIS — S6992XA Unspecified injury of left wrist, hand and finger(s), initial encounter: Secondary | ICD-10-CM

## 2017-07-07 DIAGNOSIS — M79642 Pain in left hand: Secondary | ICD-10-CM | POA: Diagnosis not present

## 2017-07-07 MED ORDER — IBUPROFEN 800 MG PO TABS: 800.0000 mg | ORAL_TABLET | Freq: Three times a day (TID) | ORAL | 0 refills | Status: DC | PRN

## 2017-07-07 NOTE — Progress Notes (Signed)
Chief Complaint  Patient presents with  . left hand    punch steering wheel on Monday, taking ibuprofen, can't turn bottle tops at work(pt works at [employer])    HPI   Pt reports that on Monday, 4 days ago, she was stuck in he snow so she punched the steering wheel of her car with her left fist (she is right handed).  She works as a [employer] at [employer] and now she cannot form a fist, grasp anything or stretch out her hand.  She has been icing her knuckle and the swelling is better She denies any elbow or wrist pain She denies numbness and tingling of the fingers   Past Medical History:  Diagnosis Date  . Depression   . LBP (low back pain) 2011  . Vitamin B12 deficiency 2012  . Vitamin D deficiency 2012    Current Outpatient Medications  Medication Sig Dispense Refill  . ALPRAZolam (XANAX) 0.5 MG tablet Take 0.5 mg by mouth at bedtime as needed for anxiety.    . Amphetamine ER (ADZENYS XR-ODT) 18.8 MG TBED Take by mouth.    [name]. amphetamine-dextroamphetamine (ADDERALL) 30 MG tablet Take 25 mg by mouth daily.   0  . buPROPion (WELLBUTRIN XL) 300 MG 24 hr tablet Take 300 mg by mouth daily.    . calcitonin, salmon, (MIACALCIN) 200 UNIT/ACT nasal spray Place 1 spray into alternate nostrils daily. (Patient not taking: Reported on 03/22/2017) 3.7 mL 11  . HYDROcodone-acetaminophen (NORCO/VICODIN) 5-325 MG tablet Take 0.5-1 tablets by mouth every 6 (six) hours as needed. (Patient not taking: Reported on 03/22/2017) 15 tablet 0  . ibuprofen (ADVIL,MOTRIN) 800 MG tablet Take 1 tablet (800 mg total) by mouth every 8 (eight) hours as needed. 30 tablet 0  . Levonorgestrel-Ethinyl Estradiol (SEASONIQUE) 0.15-0.03 &0.01 MG tablet Take 1 tablet by mouth daily.    . methocarbamol (ROBAXIN) 500 MG tablet Take 1 tablet (500 mg total) by mouth at bedtime as needed for muscle spasms. (Patient not taking: Reported on 03/22/2017) 30 tablet 0  . omeprazole (PRILOSEC) 20 MG capsule Take 1 capsule  (20 mg total) by mouth 2 (two) times daily before a meal. (Patient not taking: Reported on 03/22/2017) 60 capsule 3   No current facility-administered medications for this visit.     Allergies:  Allergies  Allergen Reactions  . Strattera [Atomoxetine Hcl]     Aggressive behavior     Past Surgical History:  Procedure Laterality Date  . CESAREAN SECTION  1-08    Social History   Socioeconomic History  . Marital status: Married    Spouse name: None  . Number of children: None  . Years of education: None  . Highest education level: None  Social Needs  . Financial resource strain: None  . Food insecurity - worry: None  . Food insecurity - inability: None  . Transportation needs - medical: None  . Transportation needs - non-medical: None  Occupational History  . None  Tobacco Use  . Smoking status: Former Smoker    Packs/day: 0.30    Years: 10.00    Pack years: 3.00    Types: Cigarettes  . Smokeless tobacco: Never Used  Substance and Sexual Activity  . Alcohol use: No    Alcohol/week: 0.0 oz  . Drug use: No  . Sexual activity: Yes  Other Topics Concern  . None  Social History Narrative  . None    Family History  Problem Relation Age of Onset  .  Cancer Mother   . Kidney disease Other      ROS Review of Systems See HPI Constitution: No fevers or chills No malaise No diaphoresis Skin: No rash or itching Eyes: no blurry vision, no double vision GU: no dysuria or hematuria Neuro: no dizziness or headaches * all others reviewed and negative   Objective: Vitals:   07/07/17 1033  BP: 126/80  Pulse: 97  Temp: 98.6 F (37 C)  TempSrc: Oral  SpO2: 100%  Weight: 173 lb 6.4 oz (78.7 kg)  Height: 5\' 6"  (1.676 m)    Physical Exam  Constitutional: She appears well-developed and well-nourished.  HENT:  Head: Normocephalic and atraumatic.  Eyes: Conjunctivae and EOM are normal.  Pulmonary/Chest: Effort normal.    Left hand swollen Difficulty forming  a fist Cap refill <2s  Xray - no fracture Assessment and Plan Ebony SetaHeather was seen today for left hand.  Diagnoses and all orders for this visit:  Left hand pain -     DG Hand Complete Left; Future -     Brace application  Injury of left hand, initial encounter -     DG Hand Complete Left; Future -     Brace application  Other orders -     ibuprofen (ADVIL,MOTRIN) 800 MG tablet; Take 1 tablet (800 mg total) by mouth every 8 (eight) hours as needed.   Ace wrap, NSAIDs, ICE, elevation Work note given Findings consistent with sprain  Leandre Wien A Andren Bethea

## 2017-07-07 NOTE — Patient Instructions (Addendum)
There is no fracture.  There is bruising and concern for a sprain of the ligaments of the hand.  Continue to ice and elevate the hand. Take ibuprofen 800mg  for pain and swelling up to 3 times a day.     IF you received an x-ray today, you will receive an invoice from [facility]. Please contact [facility] at [phone] with questions or concerns regarding your invoice.   IF you received labwork today, you will receive an invoice from [facility]. Please contact [facility] at [phone] with questions or concerns regarding your invoice.   Our billing staff will not be able to assist you with questions regarding bills from these companies.  You will be contacted with the lab results as soon as they are available. The fastest way to get your results is to activate your My Chart account. Instructions are located on the last page of this paperwork. If you have not heard from us regarding the results in 2 weeks, please contact this office.    Boxer's Knuckle Boxer's knuckle is an injury to an extensor tendon. The extensor tendons are located on the back of the hand. They help the fingers to extend. They also help to protect the finger bones and joints. Boxer's knuckle develops if the layer of tissue that lies over these tendons becomes damaged and causes a tendon to move out of position. Boxer's knuckle often affects the first knuckle of the middle finger. What are the causes? This condition is caused by direct or repeated injury (trauma) to a knuckle. If often happens during activities such boxing or martial arts. What increases the risk? This condition is more likely to develop in:  People who participate in hitting or fighting sports, such as boxing and martial arts.  People who play contact sports, such as football and rugby.  People who have poor strength and flexibility.  People who have injured a knuckle.  What are the signs or symptoms? Symptoms of this condition  include:  Pain and swelling over the injured knuckle.  Difficulty straightening the affected finger.  Delay when you try to straighten the affected finger.  Tenderness when you touch the injured knuckle.  Abnormal movement of the affected tendon when you open and close your hand.  How is this diagnosed? This condition is diagnosed with a physical exam. Sometimes, X-rays are taken to check for additional problems, such as a fracture or cyst in the bone under the injured area. How is this treated? This condition may be treated with:  Ice applied to the affected area.  Medicines for pain.  Placing the hand in a cast or splint to keep the injured joint from moving while the tendon heals.  Surgery to repair the injured tendon or tissue. This may be done in severe cases.  Follow these instructions at home: If you have a cast:  Do not stick anything inside the cast to scratch your skin. Doing that increases your risk of infection.  Check the skin around the cast every day. Report any concerns to your health care provider. You may put lotion on dry skin around the edges of the cast. Do not apply lotion to the skin underneath the cast.  Keep the cast clean and dry. If you have a splint:  Wear it as told by your health care provider. Remove it only as told by your health care provider.  Loosen the splint if your fingers become numb and tingle, or if they turn cold and blue.  Keep the splint clean and dry. Bathing  Do not take baths, swim, or use a hot tub until your health care provider approves. Ask your health care provider if you can take showers. You may only be allowed to take sponge baths for bathing.  If your health care provider approves bathing and showering, cover the cast or splint with a watertight plastic bag to protect it from water. Do not let the cast or splint get wet. Managing pain, stiffness, and swelling  If directed, apply ice to the injured area. ? Put ice  in a plastic bag. ? Place a towel between your skin and the bag. ? Leave the ice on for 20 minutes, 2-3 times per day. Driving  Do not drive or operate heavy machinery while taking prescription pain medicine.  Ask your health care provider when it is safe to drive if you have a cast or splint on your hand. General instructions  Do not put pressure on any part of the cast or splint until it is fully hardened. This may take several hours.  Do not use any tobacco products, including cigarettes, chewing tobacco, or e-cigarettes. Tobacco can delay bone healing. If you need help quitting, ask your health care provider.  Take over-the-counter and prescription medicines only as told by your health care provider.  Keep all follow-up visits as told by your health care provider. This is important. Contact a health care provider if:  Your pain gets worse.  You hand tingles or feels numb.  Your hand becomes discolored. This information is not intended to replace advice given to you by your health care provider. Make sure you discuss any questions you have with your health care provider. Document Released: 07/12/2005 Document Revised: 12/18/2015 Document Reviewed: 09/12/2014 Elsevier Interactive Patient Education  [employer]2018 [employer]vier Inc.

## 2017-07-21 DIAGNOSIS — F419 Anxiety disorder, unspecified: Secondary | ICD-10-CM | POA: Diagnosis not present

## 2017-07-21 DIAGNOSIS — Z79899 Other long term (current) drug therapy: Secondary | ICD-10-CM | POA: Diagnosis not present

## 2017-07-21 DIAGNOSIS — F902 Attention-deficit hyperactivity disorder, combined type: Secondary | ICD-10-CM | POA: Diagnosis not present

## 2017-07-29 ENCOUNTER — Ambulatory Visit: Payer: Self-pay | Admitting: *Deleted

## 2017-07-29 NOTE — Telephone Encounter (Signed)
Pt states that on Sunday, she fell of her her child's Hover board and hit her head on the ground. The pt states she hit her head pretty hard on the right side and feels like her head bounced on the ground but has a headache on the left side. Pt denies any bruising or swelling to the head, but states she does have a bruised right elbow.Pt also denies any changes in vision or LOC. Pt states headache has progressed over the past couple of days and even with taking 600mg  of Ibuprofen she does not get relief. Pt is currently at work and asking for home care advice. Advised pt that she would need to be seen at Urgent Care to be further evaluated but pt does not want to go to Urgent Care. Pt states she will she how she feels over the weekend and call back to schedule appt if needed.    Reason for Disposition . [1] After 72 hours AND [2] headache persists  Answer Assessment - Initial Assessment Questions 1. MECHANISM: "How did the injury happen?" For falls, ask: "What height did you fall from?" and "What surface did you fall against?"      Fell off hoverboard 2. ONSET: "When did the injury happen?" (Minutes or hours ago)      Sunday 3. NEUROLOGIC SYMPTOMS: "Was there any loss of consciousness?" "Are there any other neurological symptoms?"      No 4. MENTAL STATUS: "Does the person know who he is, who you are, and where he is?"      Alert and oriented 5. LOCATION: "What part of the head was hit?"      Right side 6. SCALP APPEARANCE: "What does the scalp look like? Is it bleeding now?" If so, ask: "Is it difficult to stop?"      Normal 7. SIZE: For cuts, bruises, or swelling, ask: "How large is it?" (e.g., inches or centimeters)      No 8. PAIN: "Is there any pain?" If so, ask: "How bad is it?"  (e.g., Scale 1-10; or mild, moderate, severe)     Pain 6-7 9. TETANUS: For any breaks in the skin, ask: "When was the last tetanus booster?"    No breaks in skin 10. OTHER SYMPTOMS: "Do you have any other  symptoms?" (e.g., neck pain, vomiting)       Neck pain, nausea 11. PREGNANCY: "Is there any chance you are pregnant?" "When was your last menstrual period?"        No  Protocols used: HEAD INJURY-A-AH

## 2017-07-31 DIAGNOSIS — M542 Cervicalgia: Secondary | ICD-10-CM | POA: Diagnosis not present

## 2017-07-31 DIAGNOSIS — I160 Hypertensive urgency: Secondary | ICD-10-CM | POA: Diagnosis not present

## 2017-08-01 ENCOUNTER — Encounter: Payer: Self-pay | Admitting: Internal Medicine

## 2017-08-01 ENCOUNTER — Ambulatory Visit (INDEPENDENT_AMBULATORY_CARE_PROVIDER_SITE_OTHER)
Admission: RE | Admit: 2017-08-01 | Discharge: 2017-08-01 | Disposition: A | Discharge status: Home / Self Care | Payer: BLUE CROSS/BLUE SHIELD | Source: Ambulatory Visit | Attending: Internal Medicine | Admitting: Internal Medicine

## 2017-08-01 ENCOUNTER — Ambulatory Visit: Payer: BLUE CROSS/BLUE SHIELD | Admitting: Internal Medicine

## 2017-08-01 DIAGNOSIS — E538 Deficiency of other specified B group vitamins: Secondary | ICD-10-CM

## 2017-08-01 DIAGNOSIS — M542 Cervicalgia: Secondary | ICD-10-CM | POA: Diagnosis not present

## 2017-08-01 DIAGNOSIS — S161XXA Strain of muscle, fascia and tendon at neck level, initial encounter: Secondary | ICD-10-CM

## 2017-08-01 DIAGNOSIS — E559 Vitamin D deficiency, unspecified: Secondary | ICD-10-CM

## 2017-08-01 MED ORDER — HYDROCODONE-ACETAMINOPHEN 5-325 MG PO TABS: 1.0000 | ORAL_TABLET | Freq: Four times a day (QID) | ORAL | 0 refills | Status: DC | PRN

## 2017-08-01 NOTE — Assessment & Plan Note (Signed)
On MVI 

## 2017-08-01 NOTE — Progress Notes (Signed)
Subjective:  Patient ID: [name], female    DOB: 07-Jul-1976  Age: 42 y.o. MRN: [id number]  CC: No chief complaint on file.   HPI [name] presents for L neck pain - fell off a hoover board a wk ago Sun: landed on a kitchen floor on the R side, hit the head; no LOC. Pt went to UC yesterday - X ray neck - ok. Pt took Tramadol, Flexeril; can't sleep. Pain is 8/10  Outpatient Medications Prior to Visit  Medication Sig Dispense Refill  . ALPRAZolam (XANAX) 0.5 MG tablet Take 0.5 mg by mouth at bedtime as needed for anxiety.    . Amphetamine ER (ADZENYS XR-ODT) 18.8 MG TBED Take by mouth.    [name] amphetamine-dextroamphetamine (ADDERALL) 30 MG tablet Take 25 mg by mouth daily.   0  . buPROPion (WELLBUTRIN XL) 300 MG 24 hr tablet Take 300 mg by mouth daily.    . calcitonin, salmon, (MIACALCIN) 200 UNIT/ACT nasal spray Place 1 spray into alternate nostrils daily. 3.7 mL 11  . cyclobenzaprine (FLEXERIL) 10 MG tablet   0  . HYDROcodone-acetaminophen (NORCO/VICODIN) 5-325 MG tablet Take 0.5-1 tablets by mouth every 6 (six) hours as needed. 15 tablet 0  . ibuprofen (ADVIL,MOTRIN) 800 MG tablet Take 1 tablet (800 mg total) by mouth every 8 (eight) hours as needed. 30 tablet 0  . Levonorgestrel-Ethinyl Estradiol (SEASONIQUE) 0.15-0.03 &0.01 MG tablet Take 1 tablet by mouth daily.    . methocarbamol (ROBAXIN) 500 MG tablet Take 1 tablet (500 mg total) by mouth at bedtime as needed for muscle spasms. 30 tablet 0  . omeprazole (PRILOSEC) 20 MG capsule Take 1 capsule (20 mg total) by mouth 2 (two) times daily before a meal. 60 capsule 3  . traMADol (ULTRAM) 50 MG tablet   0   No facility-administered medications prior to visit.     ROS Review of Systems  Constitutional: Negative for activity change, appetite change, chills, fatigue and unexpected weight change.  HENT: Negative for congestion, mouth sores and sinus pressure.   Eyes: Negative for visual disturbance.  Respiratory: Negative for  cough and chest tightness.   Gastrointestinal: Negative for abdominal pain and nausea.  Genitourinary: Negative for difficulty urinating, frequency and vaginal pain.  Musculoskeletal: Positive for arthralgias, neck pain and neck stiffness. Negative for back pain and gait problem.  Skin: Negative for pallor and rash.  Neurological: Negative for dizziness, tremors, weakness, numbness and headaches.  Psychiatric/Behavioral: Negative for confusion and sleep disturbance.    Objective:  BP (!) 154/92 (BP Location: Left Arm, Patient Position: Sitting, Cuff Size: Large)   Pulse 97   Temp 98.6 F (37 C) (Oral)   Ht 5\' 6"  (1.676 m)   Wt 173 lb (78.5 kg)   SpO2 99%   BMI 27.92 kg/m   BP Readings from Last 3 Encounters:  08/01/17 (!) 154/92  07/07/17 126/80  03/22/17 (!) 145/103    Wt Readings from Last 3 Encounters:  08/01/17 173 lb (78.5 kg)  07/07/17 173 lb 6.4 oz (78.7 kg)  03/22/17 170 lb 3.2 oz (77.2 kg)    Physical Exam  Constitutional: She appears well-developed. No distress.  HENT:  Head: Normocephalic.  Right Ear: External ear normal.  Left Ear: External ear normal.  Nose: Nose normal.  Mouth/Throat: Oropharynx is clear and moist.  Eyes: Conjunctivae are normal. Pupils are equal, round, and reactive to light. Right eye exhibits no discharge. Left eye exhibits no discharge.  Neck: Normal range of motion. Neck  supple. No JVD present. No tracheal deviation present. No thyromegaly present.  Cardiovascular: Normal rate, regular rhythm and normal heart sounds.  Pulmonary/Chest: No stridor. No respiratory distress. She has no wheezes.  Abdominal: Soft. Bowel sounds are normal. She exhibits no distension and no mass. There is no tenderness. There is no rebound and no guarding.  Musculoskeletal: She exhibits tenderness. She exhibits no edema.  Lymphadenopathy:    She has no cervical adenopathy.  Neurological: She displays normal reflexes. No cranial nerve deficit. She exhibits  normal muscle tone. Coordination normal.  Skin: No rash noted. No erythema.  Psychiatric: She has a normal mood and affect. Her behavior is normal. Judgment and thought content normal.    Lab Results  Component Value Date   WBC 7.9 02/01/2017   HGB 14.0 02/01/2017   HCT 42.3 02/01/2017   PLT 368 02/01/2017   GLUCOSE 86 02/01/2017   ALT 10 02/01/2017   AST 13 02/01/2017   NA 138 02/01/2017   K 4.1 02/01/2017   CL 102 02/01/2017   CREATININE 1.02 02/01/2017   BUN 14 02/01/2017   CO2 21 02/01/2017   TSH 2.80 02/01/2017    Mm Digital Diagnostic Unilat R  Result Date: 10/16/2013 CLINICAL DATA:  Right breast calcifications on a recent baseline screening mammogram. EXAM: DIGITAL DIAGNOSTIC  RIGHT MAMMOGRAM COMPARISON:  Baseline screening mammogram dated 08/01/2013. ACR Breast Density Category c: The breast tissue is heterogeneously dense, which may obscure small masses. FINDINGS: Spot magnification views of the right breast demonstrate a large number of microcalcifications in a group spanning 7 cm in the upper inner right breast. These demonstrate dependent layering in the true lateral projection. IMPRESSION: Right breast benign milk of calcium.  No evidence of malignancy. RECOMMENDATION: Annual screening mammography beginning at age 940. I have discussed the findings and recommendations with the patient. Results were also provided in writing at the conclusion of the visit. If applicable, a reminder letter will be sent to the patient regarding the next appointment. BI-RADS CATEGORY  2: Benign finding(s). Electronically Signed   By: Gordan PaymentSteve  Reid M.D.   On: 10/16/2013 09:25    Assessment & Plan:   There are no diagnoses linked to this encounter. I am having Noralee StainHeather Golson maintain her buPROPion, omeprazole, amphetamine-dextroamphetamine, ALPRAZolam, Amphetamine ER, Levonorgestrel-Ethinyl Estradiol, methocarbamol, calcitonin (salmon), HYDROcodone-acetaminophen, ibuprofen, cyclobenzaprine, and  traMADol.  No orders of the defined types were placed in this encounter.    Follow-up: No Follow-up on file.  Sonda PrimesAlex Amando Chaput, MD

## 2017-08-01 NOTE — Assessment & Plan Note (Signed)
On Vit D 

## 2017-08-01 NOTE — Assessment & Plan Note (Addendum)
L neck pain - fell off a hoover board a wk ago Sun: landed on a kitchen floor on the R side, hit the head; no LOC. Pt went to UC yesterday - X ray neck - ok. Pt took Tramadol, Flexeril; can't sleep. Pain is 8/10  Norco w/caution - short Rx PT offered Neck collar CT

## 2017-08-02 ENCOUNTER — Ambulatory Visit: Payer: BLUE CROSS/BLUE SHIELD | Admitting: Internal Medicine

## 2017-10-10 ENCOUNTER — Ambulatory Visit: Payer: Self-pay | Admitting: *Deleted

## 2017-10-10 NOTE — Telephone Encounter (Signed)
Pt  Reports  Bloating   Some  Nausea   As   Well  As  Loose  Stools  X  4  Days. Pt  Reports  Her  Stool  Smells  Somewhat  Foul.  She  Denies  Any  Vomiting .   Pt  Speaking in  Complete  sentences   Appt made  tomorrow  With Clare Gandy  At  [place]. Advised to  Go to er  If  Symptoms  Worse    Reason for Disposition . Abdominal pains regularly occur about 1 hour after meals  Answer Assessment - Initial Assessment Questions 1. NAUSEA SEVERITY: "How bad is the nausea?" (e.g., mild, moderate, severe; dehydration, weight loss)   - MILD: loss of appetite without change in eating habits   - MODERATE: decreased oral intake without significant weight loss, dehydration, or malnutrition   - SEVERE: inadequate caloric or fluid intake, significant weight loss, symptoms of dehydration      mild 2. ONSET: "When did the nausea begin?"       today 3. VOMITING: "Any vomiting?" If so, ask: "How many times today?"       no 4. RECURRENT SYMPTOM: "Have you had nausea before?" If so, ask: "When was the last time?" "What happened that time?"      occasonally 5. CAUSE: "What do you think is causing the nausea?"       Bloated   6. PREGNANCY: "Is there any chance you are pregnant?" (e.g., unprotected intercourse, missed birth control pill, broken condom)       1   Week  ago  Answer Assessment - Initial Assessment Questions 1. LOCATION: "Where does it hurt?"      Bloated     Epigastric  Area     2. RADIATION: "Does the pain shoot anywhere else?" (e.g., chest, back)       no 3. ONSET: "When did the pain begin?" (e.g., minutes, hours or days ago)         Bloating x  4  Days    4. SUDDEN: "Gradual or sudden onset?"          Gradual  Worse  4  Days   5. PATTERN "Does the pain come and go, or is it constant?"    - If constant: "Is it getting better, staying the same, or worsening?"      (Note: Constant means the pain never goes away completely; most serious pain is constant and it progresses)     - If  intermittent: "How long does it last?" "Do you have pain now?"     (Note: Intermittent means the pain goes away completely between bouts)        consatant 6. SEVERITY: "How bad is the pain?"  (e.g., Scale 1-10; mild, moderate, or severe)    - MILD (1-3): doesn't interfere with normal activities, abdomen soft and not tender to touch     - MODERATE (4-7): interferes with normal activities or awakens from sleep, tender to touch     - SEVERE (8-10): excruciating pain, doubled over, unable to do any normal activities       Mild   7. RECURRENT SYMPTOM: "Have you ever had this type of abdominal pain before?" If so, ask: "When was the last time?" and "What happened that time?"       Never  8. AGGRAVATING FACTORS: "Does anything seem to cause this pain?" (e.g., foods, stress, alcohol)  No     9. CARDIAC SYMPTOMS: "Do you have any of the following symptoms: chest pain, difficulty breathing, sweating, nausea?"       Nausea   10. OTHER SYMPTOMS: "Do you have any other symptoms?" (e.g., fever, vomiting, diarrhea)       Loose  Stool  11. PREGNANCY: "Is there any chance you are pregnant?" "When was your last menstrual period?"       1   Week  ago  Protocols used: ABDOMINAL PAIN - UPPER-A-AH, NAUSEA-A-AH

## 2017-10-11 ENCOUNTER — Ambulatory Visit: Payer: BLUE CROSS/BLUE SHIELD | Admitting: Family Medicine

## 2017-10-11 ENCOUNTER — Encounter: Payer: Self-pay | Admitting: Family Medicine

## 2017-10-11 DIAGNOSIS — R140 Abdominal distension (gaseous): Secondary | ICD-10-CM | POA: Diagnosis not present

## 2017-10-11 NOTE — Assessment & Plan Note (Signed)
Acute in nature. Possible that this is related to viral gastro. No fevers or pain. Possible for IBS. Unlikely for IBD.  - will monitor for now. If continues and no improvement can obtain, upreg, cbc w/ diff, stool culture, esr, crp - can send in zofran if she becomes nauseous

## 2017-10-11 NOTE — Progress Notes (Signed)
[name] - 43 y.o. female MRN [id number]  Date of birth: 07-12-1976  SUBJECTIVE:  Including CC & ROS.  Chief Complaint  Patient presents with  . Bloated    [name] is a 42 y.o. female that is presenting with abdominal bloating. Ongoing for five days. Denies food triggers. Admits to upper abdominal pressure. Denies diarrhea or constipation. Has the urge to have a bowel movement right after she eats. Admits to decrease appetite due to feeling full.  Denies fevers. She has been taking Protonix and gas-x with no improvement. She feels like she has to have a bowel movement right after eating. Denies any pain. Hasn't noticed any blood in her stool. Doesn't believe she is pregnant. No prior abdominal surgeries.    Review of Systems  Constitutional: Negative for fever.  HENT: Negative for congestion.   Respiratory: Negative for cough.   Gastrointestinal: Negative for abdominal pain, constipation and nausea.  Musculoskeletal: Negative for arthralgias.  Skin: Negative for color change.    HISTORY: Past Medical, Surgical, Social, and Family History Reviewed & Updated per EMR.   Pertinent Historical Findings include:  Past Medical History:  Diagnosis Date  . Depression   . LBP (low back pain) 2011  . Vitamin B12 deficiency 2012  . Vitamin D deficiency 2012    Past Surgical History:  Procedure Laterality Date  . CESAREAN SECTION  1-08    Allergies  Allergen Reactions  . Strattera [Atomoxetine Hcl]     Aggressive behavior     Family History  Problem Relation Age of Onset  . Cancer Mother   . Kidney disease Other      Social History   Socioeconomic History  . Marital status: Married    Spouse name: Not on file  . Number of children: Not on file  . Years of education: Not on file  . Highest education level: Not on file  Social Needs  . Financial resource strain: Not on file  . Food insecurity - worry: Not on file  . Food insecurity - inability: Not on file  .  Transportation needs - medical: Not on file  . Transportation needs - non-medical: Not on file  Occupational History  . Not on file  Tobacco Use  . Smoking status: Former Smoker    Packs/day: 0.30    Years: 10.00    Pack years: 3.00    Types: Cigarettes  . Smokeless tobacco: Never Used  Substance and Sexual Activity  . Alcohol use: No    Alcohol/week: 0.0 oz  . Drug use: No  . Sexual activity: Yes  Other Topics Concern  . Not on file  Social History Narrative  . Not on file     PHYSICAL EXAM:  VS: BP 136/74 (BP Location: Left Arm, Patient Position: Sitting, Cuff Size: Normal)   Pulse 68   Temp 98.3 F (36.8 C) (Oral)   Ht _0  (1.676 m)   Wt 176 lb (79.8 kg)   SpO2 100%   BMI 28.41 kg/m  Physical Exam Gen: NAD, alert, cooperative with exam, well-appearing ENT: normal lips, normal nasal mucosa,  Eye: normal EOM, normal conjunctiva and lids CV:  no edema, +2 pedal pulses   Resp: no accessory muscle use, non-labored,  GI: no masses or tenderness, no hernia, soft, no guarding, mild distention  Skin: no rashes, no areas of induration  Neuro: normal tone, normal sensation to touch Psych:  normal insight, alert and oriented MSK: normal gait, normal strength  ASSESSMENT & PLAN:   Bloating Acute in nature. Possible that this is related to viral gastro. No fevers or pain. Possible for IBS. Unlikely for IBD.  - will monitor for now. If continues and no improvement can obtain, upreg, cbc w/ diff, stool culture, esr, crp - can send in zofran if she becomes nauseous

## 2017-10-11 NOTE — Patient Instructions (Signed)
Please try to keep an eye on your symptoms.  If this continues then let me know and I can put some labs in.  Try to stay hydrated.  Please follow up with me if your symptoms worsen.

## 2017-10-13 DIAGNOSIS — F419 Anxiety disorder, unspecified: Secondary | ICD-10-CM | POA: Diagnosis not present

## 2017-10-13 DIAGNOSIS — F902 Attention-deficit hyperactivity disorder, combined type: Secondary | ICD-10-CM | POA: Diagnosis not present

## 2017-10-13 DIAGNOSIS — Z79899 Other long term (current) drug therapy: Secondary | ICD-10-CM | POA: Diagnosis not present

## 2018-01-03 DIAGNOSIS — Z1231 Encounter for screening mammogram for malignant neoplasm of breast: Secondary | ICD-10-CM | POA: Diagnosis not present

## 2018-01-03 DIAGNOSIS — Z01419 Encounter for gynecological examination (general) (routine) without abnormal findings: Secondary | ICD-10-CM | POA: Diagnosis not present

## 2018-01-03 DIAGNOSIS — Z6828 Body mass index (BMI) 28.0-28.9, adult: Secondary | ICD-10-CM | POA: Diagnosis not present

## 2018-01-17 ENCOUNTER — Ambulatory Visit (INDEPENDENT_AMBULATORY_CARE_PROVIDER_SITE_OTHER): Payer: BLUE CROSS/BLUE SHIELD | Admitting: Family Medicine

## 2018-01-17 ENCOUNTER — Encounter: Payer: Self-pay | Admitting: Family Medicine

## 2018-01-17 DIAGNOSIS — F419 Anxiety disorder, unspecified: Secondary | ICD-10-CM | POA: Diagnosis not present

## 2018-01-17 DIAGNOSIS — S86112A Strain of other muscle(s) and tendon(s) of posterior muscle group at lower leg level, left leg, initial encounter: Secondary | ICD-10-CM | POA: Diagnosis not present

## 2018-01-17 DIAGNOSIS — F902 Attention-deficit hyperactivity disorder, combined type: Secondary | ICD-10-CM | POA: Diagnosis not present

## 2018-01-17 DIAGNOSIS — Z79899 Other long term (current) drug therapy: Secondary | ICD-10-CM | POA: Diagnosis not present
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
## 2018-01-17 NOTE — Patient Instructions (Signed)
Thank you for coming in today. I recommend calf compression sleeve.  [employer] makes a good product.  Full Calf sleeve.  Use crutches as needed.   When able start calf stretching exercises.   Ice can help 20 mins 2x daily.   Recheck with me in about 3 weeks.    Medial Head Gastrocnemius Tear Rehab Ask your health care provider which exercises are safe for you. Do exercises exactly as told by your health care provider and adjust them as directed. It is normal to feel mild stretching, pulling, tightness, or discomfort as you do these exercises, but you should stop right away if you feel sudden pain or your pain gets worse.Do not begin these exercises until told by your health care provider. Stretching and range of motion exercises These exercises warm up your muscles and joints and improve the movement and flexibility of your lower leg. These exercises also help to relieve pain and stiffness. Exercise A: Gastrocnemius stretch  1. Sit with your left / right leg extended. 2. Loop a belt or towel around the ball of your left / right foot. The ball of your foot is on the walking surface, right under your toes. 3. Hold both ends of the belt or towel. 4. Keep your left / right ankle and foot relaxed and keep your knee straight while you use the belt or towel to pull your foot and ankle toward you. Stop at the first point of resistance. 5. Hold this position for __________ seconds. Repeat __________ times. Complete this exercise __________ times a day. Exercise B: Ankle alphabet  1. Sit with your left / right leg supported at the lower leg. ? Do not rest your foot on anything. ? Make sure your foot has room to move freely. 2. Think of your left / right foot as a paintbrush, and move your foot to trace each letter of the alphabet in the air. Keep your hip and knee still while you trace. 3. Trace every letter from A to Z. Repeat __________ times. Complete this exercise __________ times a  day. Strengthening exercises These exercises build strength and endurance in your lower leg. Endurance is the ability to use your muscles for a long time, even after they get tired. Exercise C: Plantar flexors with band  1. Sit with your left / right leg extended. 2. Loop a rubber exercise band or tube around the ball of your left / right foot. The ball of your foot is on the walking surface, right under your toes. 3. While holding both ends of the band or tube, slowly point your toes downward, pushing them away from you. 4. Hold this position for __________ seconds. 5. Slowly return your foot to the starting position. Repeat __________ times. Complete this exercise __________ times a day. Exercise D: Plantar flexors, standing  1. Stand with your feet shoulder-width apart. 2. Place your hands on a wall or table to steady yourself as needed, but try not to use it very much for support. 3. Rise up on your toes. 4. If this exercise is too easy, try these options: ? Shift your weight toward your left / right leg until you feel challenged. ? If told by your health care provider, stand on your left / right foot only. 5. Hold this position for __________ seconds. Repeat __________ times. Complete this exercise __________ times a day. Exercise E: Plantar flexors, eccentric  1. Stand on the balls of your feet on the edge of a step.  The ball of your foot is on the walking surface, right under your toes. 2. Place your hands on a wall or railing for balance as needed, but try not to lean on it for support. 3. Rise up on your toes, using both legs to help. 4. Slowly shift all of your weight to your left / right foot and lift your other foot off the step. 5. Slowly lower your left / right heel so it drops below the level of the step. You will feel a slight stretch in your left / right calf. 6. Put your other foot back onto the step. Repeat __________ times. Complete this exercise __________ times a  day. This information is not intended to replace advice given to you by your health care provider. Make sure you discuss any questions you have with your health care provider. Document Released: 07/12/2005 Document Revised: 03/18/2016 Document Reviewed: 06/24/2015 Elsevier Interactive Patient Education  [employer]2018 [employer]

## 2018-02-04 ENCOUNTER — Encounter: Payer: Self-pay | Admitting: Internal Medicine

## 2018-04-10 DIAGNOSIS — H5213 Myopia, bilateral: Secondary | ICD-10-CM | POA: Diagnosis not present

## 2018-04-17 DIAGNOSIS — R82998 Other abnormal findings in urine: Secondary | ICD-10-CM | POA: Diagnosis not present

## 2018-04-17 DIAGNOSIS — N390 Urinary tract infection, site not specified: Secondary | ICD-10-CM | POA: Diagnosis not present

## 2018-05-03 DIAGNOSIS — Z79899 Other long term (current) drug therapy: Secondary | ICD-10-CM | POA: Diagnosis not present

## 2018-05-03 DIAGNOSIS — F902 Attention-deficit hyperactivity disorder, combined type: Secondary | ICD-10-CM | POA: Diagnosis not present

## 2018-05-11 ENCOUNTER — Ambulatory Visit: Payer: Self-pay

## 2018-05-11 NOTE — Telephone Encounter (Signed)
Pt. Reports she started vomiting"over 24 hours ago." States she has not been able to keep anything down during this. Can not remember the last time she voided. Has been in bed, except to go to vomit. Achy "all over." No one else in her family is sick. Instructed to go to ED or UC for evaluation.Verbalizes understanding.  Reason for Disposition . [1] Drinking very little AND [2] dehydration suspected (e.g., no urine > 12 hours, very dry mouth, very lightheaded)  Answer Assessment - Initial Assessment Questions 1. VOMITING SEVERITY: "How many times have you vomited in the past 24 hours?"     - MILD:  1 - 2 times/day    - MODERATE: 3 - 5 times/day, decreased oral intake without significant weight loss or symptoms of dehydration    - SEVERE: 6 or more times/day, vomits everything or nearly everything, with significant weight loss, symptoms of dehydration      Severe 2. ONSET: "When did the vomiting begin?"      24 hours ago 3. FLUIDS: "What fluids or food have you vomited up today?" "Have you been able to keep any fluids down?"     No fluids 4. ABDOMINAL PAIN: "Are your having any abdominal pain?" If yes : "How bad is it and what does it feel like?" (e.g., crampy, dull, intermittent, constant)      Yes 5. DIARRHEA: "Is there any diarrhea?" If so, ask: "How many times today?"      Just a little 6. CONTACTS: "Is there anyone else in the family with the same symptoms?"      No 7. CAUSE: "What do you think is causing your vomiting?"     Unsure 8. HYDRATION STATUS: "Any signs of dehydration?" (e.g., dry mouth [not only dry lips], too weak to stand) "When did you last urinate?"     Can't remember the last time she voided 9. OTHER SYMPTOMS: "Do you have any other symptoms?" (e.g., fever, headache, vertigo, vomiting blood or coffee grounds, recent head injury)     Body aches 10. PREGNANCY: "Is there any chance you are pregnant?" "When was your last menstrual period?"       No  Protocols used:  [facility]

## 2018-05-11 NOTE — Telephone Encounter (Signed)
noted 

## 2018-06-01 ENCOUNTER — Encounter: Payer: Self-pay | Admitting: Family Medicine

## 2018-08-03 DIAGNOSIS — H5000 Unspecified esotropia: Secondary | ICD-10-CM | POA: Diagnosis not present

## 2018-08-03 DIAGNOSIS — H532 Diplopia: Secondary | ICD-10-CM | POA: Diagnosis not present

## 2018-08-03 DIAGNOSIS — H40013 Open angle with borderline findings, low risk, bilateral: Secondary | ICD-10-CM | POA: Diagnosis not present

## 2018-08-03 DIAGNOSIS — H353131 Nonexudative age-related macular degeneration, bilateral, early dry stage: Secondary | ICD-10-CM | POA: Diagnosis not present

## 2018-08-09 DIAGNOSIS — Z79899 Other long term (current) drug therapy: Secondary | ICD-10-CM | POA: Diagnosis not present

## 2018-08-09 DIAGNOSIS — F419 Anxiety disorder, unspecified: Secondary | ICD-10-CM | POA: Diagnosis not present

## 2018-08-09 DIAGNOSIS — F902 Attention-deficit hyperactivity disorder, combined type: Secondary | ICD-10-CM | POA: Diagnosis not present

## 2018-10-23 ENCOUNTER — Telehealth: Payer: BLUE CROSS/BLUE SHIELD | Admitting: Family

## 2018-10-23 DIAGNOSIS — R6889 Other general symptoms and signs: Secondary | ICD-10-CM

## 2018-10-23 MED ORDER — OSELTAMIVIR PHOSPHATE 75 MG PO CAPS: 75.0000 mg | ORAL_CAPSULE | Freq: Two times a day (BID) | ORAL | 0 refills | Status: DC

## 2018-10-23 NOTE — Progress Notes (Signed)

## 2018-10-27 ENCOUNTER — Telehealth (INDEPENDENT_AMBULATORY_CARE_PROVIDER_SITE_OTHER): Payer: BLUE CROSS/BLUE SHIELD | Admitting: Internal Medicine

## 2018-10-27 ENCOUNTER — Encounter: Payer: Self-pay | Admitting: Internal Medicine

## 2018-10-27 DIAGNOSIS — R05 Cough: Secondary | ICD-10-CM | POA: Diagnosis not present

## 2018-10-27 DIAGNOSIS — R6889 Other general symptoms and signs: Principal | ICD-10-CM

## 2018-10-27 DIAGNOSIS — M791 Myalgia, unspecified site: Secondary | ICD-10-CM | POA: Diagnosis not present

## 2018-10-27 DIAGNOSIS — R6883 Chills (without fever): Secondary | ICD-10-CM

## 2018-10-27 DIAGNOSIS — Z20822 Contact with and (suspected) exposure to covid-19: Secondary | ICD-10-CM | POA: Insufficient documentation

## 2018-10-27 MED ORDER — ALBUTEROL SULFATE HFA 108 (90 BASE) MCG/ACT IN AERS
2.0000 | INHALATION_SPRAY | RESPIRATORY_TRACT | 0 refills | Status: DC | PRN
Start: 2018-10-27 — End: 2020-07-28

## 2018-10-27 NOTE — Progress Notes (Signed)
Virtual Visit via Video Note  I connected with [name] on 10/27/18 at 10:20 AM EDT by a video enabled telemedicine application and verified that I am speaking with the correct person using two identifiers.   I discussed the limitations of evaluation and management by telemedicine and the availability of in person appointments. The patient expressed understanding and agreed to proceed.  History of Present Illness: The patient is a 43 y.o. YO female with visit for cough and chills and body aches. Started about 1 week ago total. Husband sick with same symptoms and fevers. She did e-visit over the weekend and was given tamiflu. She has taken this the whole thing which did nothing. She is very suspicious that she has coronavirus. She works at a pharmacy around a lot of people. Has cough and chest tightness and SOB. She is getting winded walking around the house and previously was running 2-3 miles per day. Some blueness around her nails with walking a lot around the house. She is mostly staying in bed. Since Monday symptoms have been stable but not improving. Denies fevers but has chills. Denies stomach problems. Overall it is stable but bad. Has tried tamiflu without results.   Observations/Objective: Appearance: sick, breathing appears normal with conversation, casual grooming, abdomen does not appear distended, mental status is A and O times   Assessment and Plan: See problem oriented charting  Follow Up Instructions: 3  I discussed the assessment and treatment plan with the patient. The patient was provided an opportunity to ask questions and all were answered. The patient agreed with the plan and demonstrated an understanding of the instructions.   The patient was advised to call back or seek an in-person evaluation if the symptoms worsen or if the condition fails to improve as anticipated.  Myrlene Broker, MD  Visit time 25 minutes: greater than 50% of that time was spent in face to  face counseling and coordination of care with the patient: counseled about coronavirus and the potential course as well as extensive counseling about when she needs to seek care in the ER, self-isolation as well as not to return to work until well for 3 days without symptoms.

## 2018-10-27 NOTE — Assessment & Plan Note (Signed)
Sounds like coronavirus and she is counseled extensively about the need to maintain self-isolation. Rx for albuterol to help with SOB. She does not have current SOB to need care in the ER. We discussed if SOB progresses or does not resolve to seek care in ER and call on way to let them know she likely has coronavirus so they can appropriately PPE. She is advised that there is no treatment currently for coronavirus and symptoms can be extended. She is to monitor for symptom progression. Call back with any questions and she understands when to seek care in the ER.

## 2018-11-16 DIAGNOSIS — F419 Anxiety disorder, unspecified: Secondary | ICD-10-CM | POA: Diagnosis not present

## 2018-11-16 DIAGNOSIS — F902 Attention-deficit hyperactivity disorder, combined type: Secondary | ICD-10-CM | POA: Diagnosis not present

## 2018-11-17 ENCOUNTER — Telehealth: Payer: BLUE CROSS/BLUE SHIELD | Admitting: Physician Assistant

## 2018-11-17 DIAGNOSIS — R0602 Shortness of breath: Secondary | ICD-10-CM

## 2018-11-17 NOTE — Progress Notes (Signed)
Contacted patient via phone.  She is short of breath at rest.  Using her albuterol inhaler with no relief.  Was sick with coronavirus-like symptoms 3 weeks ago, felt better after 2 weeks, symptoms returned 4 days ago.  Having pain in between her shoulder blades.  Cannot speak in full sentences.  Encourage patient to seek care at local urgent care or ED due to worsening of symptoms despite symptomatic treatment. She agrees with plan.   Based on what you shared with me, I feel your condition warrants further evaluation and I recommend that you be seen for a face to face office visit.     NOTE: If you entered your credit card information for this eVisit, you will not be charged. You may see a "hold" on your card for the $35 but that hold will drop off and you will not have a charge processed.  If you are having a true medical emergency please call 911.  If you need an urgent face to face visit, [facility] has four urgent care centers for your convenience.      The following sites will take your insurance:  . [facility]  [phone] Get Driving Directions Find a Provider at this Location  [address] [place], [place] [address] . 10 am to 8 pm Monday-Friday . 12 pm to 8 pm Saturday-Sunday   . [facility]  [phone] Get Driving Directions Find a Provider at this Location  [address], [address] [place], [place] [address] . 8 am to 8 pm Monday-Friday . 9 am to 6 pm Saturday . 11 am to 6 pm Sunday   . [facility] at [facility]  [phone] Get Driving Directions  [address] [place], [place] [address] . 8 am to 8 pm Monday-Friday . 8 am to 4 pm Saturday-Sunday   Your e-visit answers were reviewed by a board certified advanced clinical practitioner to complete your personal care plan.  Thank you for using e-Visits.

## 2018-12-14 DIAGNOSIS — Z03818 Encounter for observation for suspected exposure to other biological agents ruled out: Secondary | ICD-10-CM | POA: Diagnosis not present

## 2019-02-20 DIAGNOSIS — F902 Attention-deficit hyperactivity disorder, combined type: Secondary | ICD-10-CM | POA: Diagnosis not present

## 2019-02-20 DIAGNOSIS — F338 Other recurrent depressive disorders: Secondary | ICD-10-CM | POA: Diagnosis not present

## 2019-02-20 DIAGNOSIS — F419 Anxiety disorder, unspecified: Secondary | ICD-10-CM | POA: Diagnosis not present

## 2019-02-21 ENCOUNTER — Other Ambulatory Visit: Payer: Self-pay

## 2019-02-21 ENCOUNTER — Encounter: Payer: Self-pay | Admitting: Internal Medicine

## 2019-02-21 ENCOUNTER — Ambulatory Visit (INDEPENDENT_AMBULATORY_CARE_PROVIDER_SITE_OTHER): Payer: BC Managed Care – PPO | Admitting: Internal Medicine

## 2019-02-21 ENCOUNTER — Other Ambulatory Visit (INDEPENDENT_AMBULATORY_CARE_PROVIDER_SITE_OTHER): Payer: BC Managed Care – PPO

## 2019-02-21 VITALS — BP 150/100 | HR 89 | Temp 98.8°F | Ht 65.98 in | Wt 151.0 lb

## 2019-02-21 DIAGNOSIS — E538 Deficiency of other specified B group vitamins: Secondary | ICD-10-CM

## 2019-02-21 DIAGNOSIS — E559 Vitamin D deficiency, unspecified: Secondary | ICD-10-CM | POA: Diagnosis not present

## 2019-02-21 DIAGNOSIS — Z0000 Encounter for general adult medical examination without abnormal findings: Secondary | ICD-10-CM

## 2019-02-21 DIAGNOSIS — Z23 Encounter for immunization: Secondary | ICD-10-CM

## 2019-02-21 DIAGNOSIS — F902 Attention-deficit hyperactivity disorder, combined type: Secondary | ICD-10-CM | POA: Diagnosis not present

## 2019-02-21 LAB — BASIC METABOLIC PANEL WITH GFR
BUN: 12 mg/dL (ref 6–23)
CO2: 24 meq/L (ref 19–32)
Calcium: 9.2 mg/dL (ref 8.4–10.5)
Chloride: 105 meq/L (ref 96–112)
Creatinine, Ser: 1.05 mg/dL (ref 0.40–1.20)
GFR: 57.32 mL/min — ABNORMAL LOW
Glucose, Bld: 86 mg/dL (ref 70–99)
Potassium: 3.7 meq/L (ref 3.5–5.1)
Sodium: 137 meq/L (ref 135–145)

## 2019-02-21 LAB — HEPATIC FUNCTION PANEL
ALT: 8 U/L (ref 0–35)
AST: 11 U/L (ref 0–37)
Albumin: 4.3 g/dL (ref 3.5–5.2)
Alkaline Phosphatase: 34 U/L — ABNORMAL LOW (ref 39–117)
Bilirubin, Direct: 0.1 mg/dL (ref 0.0–0.3)
Total Bilirubin: 0.5 mg/dL (ref 0.2–1.2)
Total Protein: 6.7 g/dL (ref 6.0–8.3)

## 2019-02-21 LAB — CBC WITH DIFFERENTIAL/PLATELET
Basophils Absolute: 0.1 K/uL (ref 0.0–0.1)
Basophils Relative: 0.8 % (ref 0.0–3.0)
Eosinophils Absolute: 0.1 K/uL (ref 0.0–0.7)
Eosinophils Relative: 1 % (ref 0.0–5.0)
HCT: 39.5 % (ref 36.0–46.0)
Hemoglobin: 13.2 g/dL (ref 12.0–15.0)
Lymphocytes Relative: 22 % (ref 12.0–46.0)
Lymphs Abs: 1.5 K/uL (ref 0.7–4.0)
MCHC: 33.5 g/dL (ref 30.0–36.0)
MCV: 94.7 fl (ref 78.0–100.0)
Monocytes Absolute: 0.5 K/uL (ref 0.1–1.0)
Monocytes Relative: 7.6 % (ref 3.0–12.0)
Neutro Abs: 4.7 K/uL (ref 1.4–7.7)
Neutrophils Relative %: 68.6 % (ref 43.0–77.0)
Platelets: 328 K/uL (ref 150.0–400.0)
RBC: 4.17 Mil/uL (ref 3.87–5.11)
RDW: 12.7 % (ref 11.5–15.5)
WBC: 6.9 K/uL (ref 4.0–10.5)

## 2019-02-21 LAB — LIPID PANEL
Cholesterol: 167 mg/dL (ref 0–200)
HDL: 55.8 mg/dL
LDL Cholesterol: 89 mg/dL (ref 0–99)
NonHDL: 111.41
Total CHOL/HDL Ratio: 3
Triglycerides: 114 mg/dL (ref 0.0–149.0)
VLDL: 22.8 mg/dL (ref 0.0–40.0)

## 2019-02-21 LAB — VITAMIN D 25 HYDROXY (VIT D DEFICIENCY, FRACTURES): VITD: 34.55 ng/mL (ref 30.00–100.00)

## 2019-02-21 LAB — TSH: TSH: 2.62 u[IU]/mL (ref 0.35–4.50)

## 2019-02-21 LAB — VITAMIN B12: Vitamin B-12: 162 pg/mL — ABNORMAL LOW (ref 211–911)

## 2019-02-21 NOTE — Addendum Note (Signed)
Addended by: Cresenciano Lick on: 02/21/2019 03:56 PM   Modules accepted: Orders

## 2019-02-21 NOTE — Assessment & Plan Note (Signed)
Check B12 Not taking B12

## 2019-02-21 NOTE — Assessment & Plan Note (Signed)
Not using

## 2019-02-21 NOTE — Patient Instructions (Signed)

## 2019-02-21 NOTE — Assessment & Plan Note (Signed)
We discussed age appropriate health related issues, including available/recomended screening tests and vaccinations. We discussed a need for adhering to healthy diet and exercise. Labs were ordered to be later reviewed . All questions were answered.   

## 2019-02-21 NOTE — Assessment & Plan Note (Addendum)
Adderall XL

## 2019-02-21 NOTE — Progress Notes (Signed)
Subjective:  Patient ID: [name], female    DOB: 03/08/1976  Age: 43 y.o. MRN: [id number]  CC: Annual Exam   HPI Ebony StainHeather Mcpartlin presents for a well exam Lost wt on diet On Adderall F/u anxiety Running 2 mi a day   Outpatient Medications Prior to Visit  Medication Sig Dispense Refill  . ADDERALL XR 25 MG 24 hr capsule     . ADDERALL XR 30 MG 24 hr capsule     . albuterol (PROVENTIL HFA;VENTOLIN HFA) 108 (90 Base) MCG/ACT inhaler Inhale 2 puffs into the lungs every 4 (four) hours as needed for wheezing or shortness of breath. 1 Inhaler 0  . ALPRAZolam (XANAX) 0.5 MG tablet Take 0.5 mg by mouth at bedtime as needed for anxiety.    [name]. amphetamine-dextroamphetamine (ADDERALL) 30 MG tablet Take 25 mg by mouth daily.   0  . buPROPion (WELLBUTRIN XL) 300 MG 24 hr tablet Take 300 mg by mouth daily.    . cyclobenzaprine (FLEXERIL) 10 MG tablet   0  . ibuprofen (ADVIL,MOTRIN) 800 MG tablet Take 1 tablet (800 mg total) by mouth every 8 (eight) hours as needed. 30 tablet 0  . Levonorgestrel-Ethinyl Estradiol (SEASONIQUE) 0.15-0.03 &0.01 MG tablet Take 1 tablet by mouth daily.    [name]. omeprazole (PRILOSEC) 20 MG capsule Take 1 capsule (20 mg total) by mouth 2 (two) times daily before a meal. 60 capsule 3  . Amphetamine ER (ADZENYS XR-ODT) 18.8 MG TBED Take by mouth.     No facility-administered medications prior to visit.     ROS: Review of Systems  Constitutional: Negative for activity change, appetite change, chills, fatigue and unexpected weight change.  HENT: Negative for congestion, mouth sores and sinus pressure.   Eyes: Negative for visual disturbance.  Respiratory: Negative for cough and chest tightness.   Gastrointestinal: Negative for abdominal pain and nausea.  Genitourinary: Negative for difficulty urinating, frequency and vaginal pain.  Musculoskeletal: Negative for back pain and gait problem.  Skin: Negative for pallor and rash.  Neurological: Negative for dizziness,  tremors, weakness, numbness and headaches.  Psychiatric/Behavioral: Negative for confusion, sleep disturbance and suicidal ideas. The patient is nervous/anxious.     Objective:  BP (!) 150/100 (BP Location: Left Arm, Patient Position: Sitting, Cuff Size: Normal)   Pulse 89   Temp 98.8 F (37.1 C) (Oral)   Ht 5' 5.98" (1.676 m)   Wt 151 lb (68.5 kg)   SpO2 97%   BMI 24.39 kg/m   BP Readings from Last 3 Encounters:  02/21/19 (!) 150/100  01/17/18 (!) 162/116  10/11/17 136/74    Wt Readings from Last 3 Encounters:  02/21/19 151 lb (68.5 kg)  01/17/18 181 lb (82.1 kg)  10/11/17 176 lb (79.8 kg)    Physical Exam Constitutional:      General: She is not in acute distress.    Appearance: She is well-developed.  HENT:     Head: Normocephalic.     Right Ear: External ear normal.     Left Ear: External ear normal.     Nose: Nose normal.  Eyes:     General:        Right eye: No discharge.        Left eye: No discharge.     Conjunctiva/sclera: Conjunctivae normal.     Pupils: Pupils are equal, round, and reactive to light.  Neck:     Musculoskeletal: Normal range of motion and neck supple.     Thyroid: No  thyromegaly.     Vascular: No JVD.     Trachea: No tracheal deviation.  Cardiovascular:     Rate and Rhythm: Normal rate and regular rhythm.     Heart sounds: Normal heart sounds.  Pulmonary:     Effort: No respiratory distress.     Breath sounds: No stridor. No wheezing.  Abdominal:     General: Bowel sounds are normal. There is no distension.     Palpations: Abdomen is soft. There is no mass.     Tenderness: There is no abdominal tenderness. There is no guarding or rebound.  Musculoskeletal:        General: No tenderness.  Lymphadenopathy:     Cervical: No cervical adenopathy.  Skin:    Findings: No erythema or rash.  Neurological:     Cranial Nerves: No cranial nerve deficit.     Motor: No abnormal muscle tone.     Coordination: Coordination normal.     Deep  Tendon Reflexes: Reflexes normal.  Psychiatric:        Behavior: Behavior normal.        Thought Content: Thought content normal.        Judgment: Judgment normal.     Lab Results  Component Value Date   WBC 7.9 02/01/2017   HGB 14.0 02/01/2017   HCT 42.3 02/01/2017   PLT 368 02/01/2017   GLUCOSE 86 02/01/2017   ALT 10 02/01/2017   AST 13 02/01/2017   NA 138 02/01/2017   K 4.1 02/01/2017   CL 102 02/01/2017   CREATININE 1.02 02/01/2017   BUN 14 02/01/2017   CO2 21 02/01/2017   TSH 2.80 02/01/2017    Ct Cervical Spine Wo Contrast  Result Date: 08/01/2017 CLINICAL DATA:  Persistent neck pain post fall on July 24, 2017. EXAM: CT CERVICAL SPINE WITHOUT CONTRAST TECHNIQUE: Multidetector CT imaging of the cervical spine was performed without intravenous contrast. Multiplanar CT image reconstructions were also generated. COMPARISON:  None. FINDINGS: Alignment: Normal. Skull base and vertebrae: No acute fracture. No primary bone lesion or focal pathologic process. 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 Soft tissues and spinal canal: No prevertebral fluid or swelling. No visible canal hematoma. Disc levels: Partial effacement of C5-C6 intervertebral space, otherwise normal. Upper chest: Negative. Other: None. IMPRESSION: No evidence of acute fracture. 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 Electronically Signed   By: Fidela Salisbury M.D.   On: 08/01/2017 16:51    Assessment & Plan:   There are no diagnoses linked to this encounter.   No orders of the defined types were placed in this encounter.    Follow-up: No follow-ups on file.  Walker Kehr, MD

## 2019-02-22 LAB — URINALYSIS
Bilirubin Urine: NEGATIVE
Ketones, ur: NEGATIVE
Leukocytes,Ua: NEGATIVE
Nitrite: NEGATIVE
Specific Gravity, Urine: >=1.03 — AB (ref 1.000–1.030)
Total Protein, Urine: NEGATIVE
Urine Glucose: NEGATIVE
Urobilinogen, UA: 0.2 (ref 0.0–1.0)
pH: 5 (ref 5.0–8.0)

## 2019-02-26 ENCOUNTER — Ambulatory Visit: Payer: BC Managed Care – PPO

## 2019-05-03 DIAGNOSIS — Z6824 Body mass index (BMI) 24.0-24.9, adult: Secondary | ICD-10-CM | POA: Diagnosis not present

## 2019-05-03 DIAGNOSIS — Z01419 Encounter for gynecological examination (general) (routine) without abnormal findings: Secondary | ICD-10-CM | POA: Diagnosis not present

## 2019-05-03 DIAGNOSIS — Z1231 Encounter for screening mammogram for malignant neoplasm of breast: Secondary | ICD-10-CM | POA: Diagnosis not present

## 2019-05-28 ENCOUNTER — Emergency Department (HOSPITAL_COMMUNITY)
Admission: EM | Admit: 2019-05-28 | Discharge: 2019-05-28 | Disposition: A | Discharge status: Home / Self Care | Payer: BC Managed Care – PPO | Attending: Emergency Medicine | Admitting: Emergency Medicine

## 2019-05-28 ENCOUNTER — Emergency Department (HOSPITAL_COMMUNITY): Payer: BC Managed Care – PPO

## 2019-05-28 ENCOUNTER — Other Ambulatory Visit: Payer: Self-pay

## 2019-05-28 ENCOUNTER — Ambulatory Visit: Payer: Self-pay | Admitting: *Deleted

## 2019-05-28 DIAGNOSIS — Z87891 Personal history of nicotine dependence: Secondary | ICD-10-CM | POA: Diagnosis not present

## 2019-05-28 DIAGNOSIS — R002 Palpitations: Secondary | ICD-10-CM | POA: Diagnosis not present

## 2019-05-28 DIAGNOSIS — R202 Paresthesia of skin: Secondary | ICD-10-CM

## 2019-05-28 DIAGNOSIS — Z79899 Other long term (current) drug therapy: Secondary | ICD-10-CM | POA: Insufficient documentation

## 2019-05-28 DIAGNOSIS — R519 Headache, unspecified: Secondary | ICD-10-CM | POA: Diagnosis not present

## 2019-05-28 DIAGNOSIS — R079 Chest pain, unspecified: Secondary | ICD-10-CM | POA: Diagnosis not present

## 2019-05-28 DIAGNOSIS — I10 Essential (primary) hypertension: Secondary | ICD-10-CM | POA: Diagnosis not present

## 2019-05-28 DIAGNOSIS — E871 Hypo-osmolality and hyponatremia: Secondary | ICD-10-CM | POA: Diagnosis not present

## 2019-05-28 LAB — CBC
HCT: 40.8 % (ref 36.0–46.0)
Hemoglobin: 13.1 g/dL (ref 12.0–15.0)
MCH: 31 pg (ref 26.0–34.0)
MCHC: 32.1 g/dL (ref 30.0–36.0)
MCV: 96.5 fL (ref 80.0–100.0)
Platelets: 331 10*3/uL (ref 150–400)
RBC: 4.23 MIL/uL (ref 3.87–5.11)
RDW: 12.4 % (ref 11.5–15.5)
WBC: 7.1 10*3/uL (ref 4.0–10.5)
nRBC: 0 % (ref 0.0–0.2)

## 2019-05-28 LAB — BASIC METABOLIC PANEL
Anion gap: 7 (ref 5–15)
BUN: 10 mg/dL (ref 6–20)
CO2: 22 mmol/L (ref 22–32)
Calcium: 8.3 mg/dL — ABNORMAL LOW (ref 8.9–10.3)
Chloride: 99 mmol/L (ref 98–111)
Creatinine, Ser: 1 mg/dL (ref 0.44–1.00)
GFR calc Af Amer: >60 mL/min (ref >60)
GFR calc non Af Amer: >60 mL/min (ref >60)
Glucose, Bld: 89 mg/dL (ref 70–99)
Potassium: 3.9 mmol/L (ref 3.5–5.1)
Sodium: 128 mmol/L — ABNORMAL LOW (ref 135–145)

## 2019-05-28 MED ORDER — AMLODIPINE BESYLATE 5 MG PO TABS: 5.0000 mg | ORAL_TABLET | Freq: Every day | ORAL | 0 refills | Status: DC

## 2019-05-28 NOTE — Telephone Encounter (Signed)
Pt went to ED

## 2019-05-28 NOTE — Telephone Encounter (Signed)
noted 

## 2019-05-28 NOTE — Telephone Encounter (Signed)
Patient advised ED for elevated BP and symptoms.  Reason for Disposition . [5] Systolic BP  >= 830 OR Diastolic >= 940 AND [7] cardiac or neurologic symptoms (e.g., chest pain, difficulty breathing, unsteady gait, blurred vision)  Answer Assessment - Initial Assessment Questions 1. BLOOD PRESSURE: "What is the blood pressure?" "Did you take at least two measurements 5 minutes apart?"     156/110 ( 165/110- in the middle of the night)- patient is in car now and can not recheck BP 2. ONSET: "When did you take your blood pressure?"    10:45 3. HOW: "How did you obtain the blood pressure?" (e.g., visiting nurse, automatic home BP monitor)     Automatic cuff 4. HISTORY: "Do you have a history of high blood pressure?"     no 5. MEDICATIONS: "Are you taking any medications for blood pressure?" "Have you missed any doses recently?"     no 6. OTHER SYMPTOMS: "Do you have any symptoms?" (e.g., headache, chest pain, blurred vision, difficulty breathing, weakness)     Left arm tingling/dull, chest fluttering 7. PREGNANCY: "Is there any chance you are pregnant?" "When was your last menstrual period?"     No- LMP present  Protocols used: HIGH BLOOD PRESSURE-A-AH

## 2019-05-28 NOTE — ED Notes (Signed)
An After Visit Summary was printed and given to the patient. Discharge instructions given and no further questions at this time.  

## 2019-05-28 NOTE — ED Notes (Signed)
Patient transported to CT 

## 2019-05-28 NOTE — ED Notes (Signed)
ED Provider at bedside. 

## 2019-05-28 NOTE — ED Triage Notes (Signed)
Per patient she has tingling in left arm and a fluttering feeling in her chest. Pt also endorses headache. Patient states her blood pressure was elevated today at 155/105 - 165/110 .[name]  Patient states bp has been elevated for a week. Patient in no pain at this time. Denies nausea, vomiting, diaphoresis.

## 2019-05-28 NOTE — ED Provider Notes (Signed)
[facility]-EMERGENCY DEPT Provider Note   CSN: [id number] Arrival date & time: 05/28/19  1122     History   Chief Complaint No chief complaint on file.   HPI [name] is a 43 y.o. female.     43 year old female who presents with several days of persistent left arm paresthesias and palpitations.  No associated chest pain or chest pressure.  Mild headache without visual changes or emesis.  Was seen by her physician few weeks ago and diagnosed with high blood pressure and was to follow-up which she admits that she has not.  Patient's left arm paresthesias are nonpositional.  They have not been associate with ataxia.  No associated weakness in her arms or legs.  No facial droop or confusion noted.  Denies any exertional dyspnea.  Called her doctor and was told to come here for further management     Past Medical History:  Diagnosis Date  . Depression   . LBP (low back pain) 2011  . Vitamin B12 deficiency 2012  . Vitamin D deficiency 2012    Patient Active Problem List   Diagnosis Date Noted  . Suspected 2019 novel coronavirus infection 10/27/2018  . Gastrocnemius muscle tear, left, initial encounter 01/17/2018  . Bloating 10/11/2017  . Neck strain, initial encounter 08/01/2017  . Sacral insufficiency fracture 02/01/2017  . Shoulder pain, right 12/06/2016  . Allergic rhinitis 11/24/2016  . Acute sinus infection 11/24/2016  . ADD (attention deficit disorder) 04/24/2015  . Large breasts 10/01/2013  . Scalp cyst 07/03/2013  . Sacroiliac joint dysfunction of left side 04/11/2013  . Nonallopathic lesion of cervical region 04/11/2013  . Nonallopathic lesion of lumbosacral region 04/11/2013  . Nonallopathic lesion of thoracic region 04/11/2013  . Jaw dislocation 02/21/2012  . Plantar fasciitis 02/09/2012  . Abdominal tenderness, epigastric 07/01/2011  . GERD (gastroesophageal reflux disease) 07/01/2011  . Well adult exam 01/21/2011  . Low back pain  12/12/2010  . Paresthesia 12/12/2010  . Fatigue 12/12/2010  . Vitamin B 12 deficiency 12/12/2010  . Vitamin D deficiency 12/12/2010  . Depression 12/12/2010    Past Surgical History:  Procedure Laterality Date  . CESAREAN SECTION  1-08     OB History   No obstetric history on file.      Home Medications    Prior to Admission medications   Medication Sig Start Date End Date Taking? Authorizing Provider  ADDERALL XR 25 MG 24 hr capsule  01/24/19   [provider]  ADDERALL XR 30 MG 24 hr capsule  02/09/19   [provider]  albuterol (PROVENTIL HFA;VENTOLIN HFA) 108 (90 Base) MCG/ACT inhaler Inhale 2 puffs into the lungs every 4 (four) hours as needed for wheezing or shortness of breath. 10/27/18   Myrlene Broker, MD  ALPRAZolam Prudy Feeler) 0.5 MG tablet Take 0.5 mg by mouth at bedtime as needed for anxiety.    [provider]  Amphetamine ER (ADZENYS XR-ODT) 18.8 MG TBED Take by mouth.    [provider]  amphetamine-dextroamphetamine (ADDERALL) 30 MG tablet Take 25 mg by mouth daily.  11/02/16   Nche, Bonna Gains, NP  buPROPion (WELLBUTRIN XL) 300 MG 24 hr tablet Take 300 mg by mouth daily.    [provider]  cyclobenzaprine (FLEXERIL) 10 MG tablet  07/31/17   [provider]  ibuprofen (ADVIL,MOTRIN) 800 MG tablet Take 1 tablet (800 mg total) by mouth every 8 (eight) hours as needed. 07/07/17   Doristine Bosworth, MD  Levonorgestrel-Ethinyl  Estradiol (SEASONIQUE) 0.15-0.03 &0.01 MG tablet Take 1 tablet by mouth daily.    [provider]  omeprazole (PRILOSEC) 20 MG capsule Take 1 capsule (20 mg total) by mouth 2 (two) times daily before a meal. 11/02/16   Nche, Charlene Brooke, NP    Family History Family History  Problem Relation Age of Onset  . Cancer Mother   . Kidney disease Other     Social History Social History   Tobacco Use  . Smoking status: Former Smoker    Packs/day: 0.30    Years: 10.00    Pack  years: 3.00    Types: Cigarettes  . Smokeless tobacco: Never Used  Substance Use Topics  . Alcohol use: No    Alcohol/week: 0.0 standard drinks  . Drug use: No     Allergies   Strattera [atomoxetine hcl]   Review of Systems Review of Systems  All other systems reviewed and are negative.    Physical Exam Updated Vital Signs BP (!) 159/101   Pulse 93   Temp 98.6 F (37 C) (Oral)   Resp 19   Ht 1.676 m (5\' 6" )   Wt 68 kg   LMP 05/28/2019   SpO2 100%   BMI 24.21 kg/m   Physical Exam Vitals signs and nursing note reviewed.  Constitutional:      General: She is not in acute distress.    Appearance: Normal appearance. She is well-developed. She is not toxic-appearing.  HENT:     Head: Normocephalic and atraumatic.  Eyes:     General: Lids are normal.     Conjunctiva/sclera: Conjunctivae normal.     Pupils: Pupils are equal, round, and reactive to light.  Neck:     Musculoskeletal: Normal range of motion and neck supple.     Thyroid: No thyroid mass.     Trachea: No tracheal deviation.  Cardiovascular:     Rate and Rhythm: Normal rate and regular rhythm.     Heart sounds: Normal heart sounds. No murmur. No gallop.   Pulmonary:     Effort: Pulmonary effort is normal. No respiratory distress.     Breath sounds: Normal breath sounds. No stridor. No decreased breath sounds, wheezing, rhonchi or rales.  Abdominal:     General: Bowel sounds are normal. There is no distension.     Palpations: Abdomen is soft.     Tenderness: There is no abdominal tenderness. There is no rebound.  Musculoskeletal: Normal range of motion.        General: No tenderness.  Skin:    General: Skin is warm and dry.     Findings: No abrasion or rash.  Neurological:     Mental Status: She is alert and oriented to person, place, and time.     GCS: GCS eye subscore is 4. GCS verbal subscore is 5. GCS motor subscore is 6.     Cranial Nerves: No cranial nerve deficit.     Sensory: Sensation is  intact. No sensory deficit.     Motor: No weakness or tremor.     Coordination: Finger-Nose-Finger Test normal.     Comments: Sensation normal in upper as well as lower extremities.  No difference with two-point discrimination in the left or right arm.  Psychiatric:        Speech: Speech normal.        Behavior: Behavior normal.      ED Treatments / Results  Labs (all labs ordered are listed, but only abnormal results  are displayed) Labs Reviewed  BASIC METABOLIC PANEL  CBC    EKG EKG Interpretation  Date/Time:  Monday May 28 2019 11:47:59 EST Ventricular Rate:  88 PR Interval:    QRS Duration: 97 QT Interval:  365 QTC Calculation: 442 R Axis:   28 Text Interpretation: Sinus rhythm Probable left atrial enlargement Confirmed by Lorre NickAllen, Marvetta Vohs ([id number]) on 05/28/2019 1:28:50 PM   Radiology Dg Chest 2 View  Result Date: 05/28/2019 CLINICAL DATA:  Chest pain and dizziness.  Hypertension. EXAM: CHEST - 2 VIEW COMPARISON:  Dec 08, 2012. FINDINGS: Lungs are clear. Heart size and pulmonary vascularity are normal. No adenopathy. No bone lesions. No pneumothorax. IMPRESSION: No edema or consolidation. Electronically Signed   By: Bretta BangWilliam  Woodruff III M.D.   On: 05/28/2019 12:36    Procedures Procedures (including critical care time)  Medications Ordered in ED Medications - No data to display   Initial Impression / Assessment and Plan / ED Course  I have reviewed the triage vital signs and the nursing notes.  Pertinent labs & imaging results that were available during my care of the patient were reviewed by me and considered in my medical decision making (see chart for details).       Patient with subjective paresthesias but nothing objectively. Patient's head CT negative here.  Has had mild hyponatremia.  Blood pressure slightly elevated here and will place on Norvasc and not diuretic due to her hyponatremia.  I have encouraged her to follow-up with her doctor.  Final  Clinical Impressions(s) / ED Diagnoses   Final diagnoses:  None    ED Discharge Orders    None       Lorre NickAllen, Darianne Muralles, MD 05/28/19 1333

## 2019-06-06 DIAGNOSIS — F419 Anxiety disorder, unspecified: Secondary | ICD-10-CM | POA: Diagnosis not present

## 2019-06-06 DIAGNOSIS — Z79899 Other long term (current) drug therapy: Secondary | ICD-10-CM | POA: Diagnosis not present

## 2019-06-06 DIAGNOSIS — F902 Attention-deficit hyperactivity disorder, combined type: Secondary | ICD-10-CM | POA: Diagnosis not present

## 2019-06-18 ENCOUNTER — Other Ambulatory Visit: Payer: Self-pay

## 2019-06-18 ENCOUNTER — Encounter: Payer: Self-pay | Admitting: Internal Medicine

## 2019-06-18 ENCOUNTER — Other Ambulatory Visit (INDEPENDENT_AMBULATORY_CARE_PROVIDER_SITE_OTHER): Payer: BC Managed Care – PPO

## 2019-06-18 ENCOUNTER — Ambulatory Visit (INDEPENDENT_AMBULATORY_CARE_PROVIDER_SITE_OTHER): Payer: BC Managed Care – PPO | Admitting: Internal Medicine

## 2019-06-18 DIAGNOSIS — E559 Vitamin D deficiency, unspecified: Secondary | ICD-10-CM | POA: Diagnosis not present

## 2019-06-18 DIAGNOSIS — I10 Essential (primary) hypertension: Secondary | ICD-10-CM

## 2019-06-18 DIAGNOSIS — E538 Deficiency of other specified B group vitamins: Secondary | ICD-10-CM

## 2019-06-18 DIAGNOSIS — F902 Attention-deficit hyperactivity disorder, combined type: Secondary | ICD-10-CM

## 2019-06-18 DIAGNOSIS — E871 Hypo-osmolality and hyponatremia: Secondary | ICD-10-CM | POA: Insufficient documentation

## 2019-06-18 LAB — URINALYSIS
Bilirubin Urine: NEGATIVE
Ketones, ur: NEGATIVE
Leukocytes,Ua: NEGATIVE
Nitrite: NEGATIVE
Specific Gravity, Urine: 1.025 (ref 1.000–1.030)
Total Protein, Urine: NEGATIVE
Urine Glucose: NEGATIVE
Urobilinogen, UA: 0.2 (ref 0.0–1.0)
pH: 6 (ref 5.0–8.0)

## 2019-06-18 LAB — BASIC METABOLIC PANEL
BUN: 13 mg/dL (ref 6–23)
CO2: 25 mEq/L (ref 19–32)
Calcium: 9 mg/dL (ref 8.4–10.5)
Chloride: 104 mEq/L (ref 96–112)
Creatinine, Ser: 0.86 mg/dL (ref 0.40–1.20)
GFR: 72.06 mL/min (ref >60.00)
Glucose, Bld: 95 mg/dL (ref 70–99)
Potassium: 3.8 mEq/L (ref 3.5–5.1)
Sodium: 137 mEq/L (ref 135–145)

## 2019-06-18 LAB — VITAMIN B12: Vitamin B-12: 162 pg/mL — ABNORMAL LOW (ref 211–911)

## 2019-06-18 MED ORDER — AMLODIPINE-OLMESARTAN 5-20 MG PO TABS: 1.0000 | ORAL_TABLET | Freq: Every day | ORAL | 11 refills | Status: DC

## 2019-06-18 NOTE — Assessment & Plan Note (Signed)
Vit b12

## 2019-06-18 NOTE — Progress Notes (Signed)
Subjective:  Patient ID: [name], female    DOB: 07/23/1976  Age: 43 y.o. MRN: [id number]  CC: No chief complaint on file.   HPI [name] presents for elevated BP. She went to ER on 05/28/19 - on Amlodipine 5 mg/d F/u Na 128 F/u B12 def  Outpatient Medications Prior to Visit  Medication Sig Dispense Refill  . ADDERALL XR 25 MG 24 hr capsule     . ADDERALL XR 30 MG 24 hr capsule     . albuterol (PROVENTIL HFA;VENTOLIN HFA) 108 (90 Base) MCG/ACT inhaler Inhale 2 puffs into the lungs every 4 (four) hours as needed for wheezing or shortness of breath. 1 Inhaler 0  . ALPRAZolam (XANAX) 0.5 MG tablet Take 0.5 mg by mouth at bedtime as needed for anxiety.    [name] amLODipine (NORVASC) 5 MG tablet Take 1 tablet (5 mg total) by mouth daily. 30 tablet 0  . Amphetamine ER (ADZENYS XR-ODT) 18.8 MG TBED Take by mouth.    [name] amphetamine-dextroamphetamine (ADDERALL) 30 MG tablet Take 25 mg by mouth daily.   0  . buPROPion (WELLBUTRIN XL) 300 MG 24 hr tablet Take 300 mg by mouth daily.    . cyclobenzaprine (FLEXERIL) 10 MG tablet   0  . ibuprofen (ADVIL,MOTRIN) 800 MG tablet Take 1 tablet (800 mg total) by mouth every 8 (eight) hours as needed. 30 tablet 0  . Levonorgestrel-Ethinyl Estradiol (SEASONIQUE) 0.15-0.03 &0.01 MG tablet Take 1 tablet by mouth daily.    [name] omeprazole (PRILOSEC) 20 MG capsule Take 1 capsule (20 mg total) by mouth 2 (two) times daily before a meal. 60 capsule 3   No facility-administered medications prior to visit.     ROS: Review of Systems  Constitutional: Negative for activity change, appetite change, chills, fatigue and unexpected weight change.  HENT: Negative for congestion, mouth sores and sinus pressure.   Eyes: Negative for visual disturbance.  Respiratory: Negative for cough and chest tightness.   Gastrointestinal: Negative for abdominal pain and nausea.  Genitourinary: Negative for difficulty urinating, frequency and vaginal pain.  Musculoskeletal:  Negative for back pain and gait problem.  Skin: Negative for pallor and rash.  Neurological: Negative for dizziness, tremors, weakness, numbness and headaches.  Psychiatric/Behavioral: Negative for confusion and sleep disturbance.    Objective:  BP (!) 146/98 (BP Location: Left Arm, Patient Position: Sitting, Cuff Size: Normal)   Pulse 74   Temp 97.8 F (36.6 C) (Oral)   Ht 5\' 6"  (1.676 m)   Wt 154 lb (69.9 kg)   LMP 05/28/2019   SpO2 99%   BMI 24.86 kg/m   BP Readings from Last 3 Encounters:  06/18/19 (!) 146/98  05/28/19 (!) 154/110  02/21/19 (!) 150/100    Wt Readings from Last 3 Encounters:  06/18/19 154 lb (69.9 kg)  05/28/19 150 lb (68 kg)  02/21/19 151 lb (68.5 kg)    Physical Exam Constitutional:      General: She is not in acute distress.    Appearance: She is well-developed.  HENT:     Head: Normocephalic.     Right Ear: External ear normal.     Left Ear: External ear normal.     Nose: Nose normal.  Eyes:     General:        Right eye: No discharge.        Left eye: No discharge.     Conjunctiva/sclera: Conjunctivae normal.     Pupils: Pupils are equal, round, and reactive  to light.  Neck:     Musculoskeletal: Normal range of motion and neck supple.     Thyroid: No thyromegaly.     Vascular: No JVD.     Trachea: No tracheal deviation.  Cardiovascular:     Rate and Rhythm: Normal rate and regular rhythm.     Heart sounds: Normal heart sounds.  Pulmonary:     Effort: No respiratory distress.     Breath sounds: No stridor. No wheezing.  Abdominal:     General: Bowel sounds are normal. There is no distension.     Palpations: Abdomen is soft. There is no mass.     Tenderness: There is no abdominal tenderness. There is no guarding or rebound.  Musculoskeletal:        General: No tenderness.  Lymphadenopathy:     Cervical: No cervical adenopathy.  Skin:    Findings: No erythema or rash.  Neurological:     Mental Status: She is oriented to person,  place, and time.     Cranial Nerves: No cranial nerve deficit.     Motor: No abnormal muscle tone.     Coordination: Coordination normal.     Deep Tendon Reflexes: Reflexes normal.  Psychiatric:        Behavior: Behavior normal.        Thought Content: Thought content normal.        Judgment: Judgment normal.     Lab Results  Component Value Date   WBC 7.1 05/28/2019   HGB 13.1 05/28/2019   HCT 40.8 05/28/2019   PLT 331 05/28/2019   GLUCOSE 89 05/28/2019   CHOL 167 02/21/2019   TRIG 114.0 02/21/2019   HDL 55.80 02/21/2019   LDLCALC 89 02/21/2019   ALT 8 02/21/2019   AST 11 02/21/2019   NA 128 (L) 05/28/2019   K 3.9 05/28/2019   CL 99 05/28/2019   CREATININE 1.00 05/28/2019   BUN 10 05/28/2019   CO2 22 05/28/2019   TSH 2.62 02/21/2019    Dg Chest 2 View  Result Date: 05/28/2019 CLINICAL DATA:  Chest pain and dizziness.  Hypertension. EXAM: CHEST - 2 VIEW COMPARISON:  Dec 08, 2012. FINDINGS: Lungs are clear. Heart size and pulmonary vascularity are normal. No adenopathy. No bone lesions. No pneumothorax. IMPRESSION: No edema or consolidation. Electronically Signed   By: Bretta Bang III M.D.   On: 05/28/2019 12:36   Ct Head Wo Contrast  Result Date: 05/28/2019 CLINICAL DATA:  Per patient "headache with hypertension, and tingling down her left arm. No previous history of hypertension, patient doctor sent her to the ED" EXAM: CT HEAD WITHOUT CONTRAST TECHNIQUE: Contiguous axial images were obtained from the base of the skull through the vertex without intravenous contrast. COMPARISON:  None. FINDINGS: Brain: No evidence of acute infarction, hemorrhage, hydrocephalus, extra-axial collection or mass lesion/mass effect. Vascular: No hyperdense vessel or unexpected calcification. Skull: Normal. Negative for fracture or focal lesion. Sinuses/Orbits: Visualized globes and orbits are unremarkable. The visualized sinuses and mastoid air cells are clear. Other: None. IMPRESSION:  Normal enhanced CT scan of the brain. Electronically Signed   By: Amie Portland M.D.   On: 05/28/2019 12:45    Assessment & Plan:   There are no diagnoses linked to this encounter.   No orders of the defined types were placed in this encounter.    Follow-up: No follow-ups on file.  Sonda Primes, MD

## 2019-06-18 NOTE — Assessment & Plan Note (Signed)
Reduce water intake.

## 2019-06-18 NOTE — Assessment & Plan Note (Signed)
Discuss Adderall dose reduction w/Amy S.

## 2019-06-18 NOTE — Assessment & Plan Note (Signed)
New 05/2019 - poss related to Adderall, Wellbutrin, BCP D/c Norvasc. Start Azor Renal US NAS diet

## 2019-06-18 NOTE — Assessment & Plan Note (Signed)
Re-start Vit D 

## 2019-06-20 ENCOUNTER — Other Ambulatory Visit: Payer: Self-pay | Admitting: Internal Medicine

## 2019-06-20 MED ORDER — CYANOCOBALAMIN 1000 MCG/ML IJ SOLN: 1000.0000 ug | INTRAMUSCULAR | 6 refills | Status: DC

## 2019-06-28 ENCOUNTER — Ambulatory Visit: Payer: BC Managed Care – PPO | Admitting: Internal Medicine

## 2019-07-02 ENCOUNTER — Other Ambulatory Visit: Payer: BC Managed Care – PPO

## 2019-08-14 DIAGNOSIS — Z20828 Contact with and (suspected) exposure to other viral communicable diseases: Secondary | ICD-10-CM | POA: Diagnosis not present

## 2019-08-20 DIAGNOSIS — Z20828 Contact with and (suspected) exposure to other viral communicable diseases: Secondary | ICD-10-CM | POA: Diagnosis not present

## 2019-08-20 DIAGNOSIS — U071 COVID-19: Secondary | ICD-10-CM | POA: Diagnosis not present

## 2019-08-22 DIAGNOSIS — Z20828 Contact with and (suspected) exposure to other viral communicable diseases: Secondary | ICD-10-CM | POA: Diagnosis not present

## 2019-08-26 DIAGNOSIS — Z20828 Contact with and (suspected) exposure to other viral communicable diseases: Secondary | ICD-10-CM | POA: Diagnosis not present

## 2019-08-26 DIAGNOSIS — U071 COVID-19: Secondary | ICD-10-CM | POA: Diagnosis not present

## 2019-09-05 DIAGNOSIS — Z79899 Other long term (current) drug therapy: Secondary | ICD-10-CM | POA: Diagnosis not present

## 2019-09-05 DIAGNOSIS — F902 Attention-deficit hyperactivity disorder, combined type: Secondary | ICD-10-CM | POA: Diagnosis not present

## 2019-09-05 DIAGNOSIS — F419 Anxiety disorder, unspecified: Secondary | ICD-10-CM | POA: Diagnosis not present

## 2019-11-17 ENCOUNTER — Ambulatory Visit: Payer: BC Managed Care – PPO

## 2019-11-28 DIAGNOSIS — Z79899 Other long term (current) drug therapy: Secondary | ICD-10-CM | POA: Diagnosis not present

## 2019-11-28 DIAGNOSIS — F902 Attention-deficit hyperactivity disorder, combined type: Secondary | ICD-10-CM | POA: Diagnosis not present

## 2019-11-28 DIAGNOSIS — F419 Anxiety disorder, unspecified: Secondary | ICD-10-CM | POA: Diagnosis not present

## 2020-02-18 ENCOUNTER — Telehealth: Payer: Self-pay | Admitting: Internal Medicine

## 2020-02-18 NOTE — Telephone Encounter (Signed)
New Message:   Pt is calling and states she has loss 17 lbs since November and she was not trying. Pt also states she thinks since January that she has been having night sweats and chills. Pt would like to know what Dr. Macario Golds would advise. Please advise.

## 2020-02-19 NOTE — Telephone Encounter (Signed)
Pt unintentionally lost 17 lbs since November and is also having night sweats and chills. Please advise

## 2020-02-20 NOTE — Telephone Encounter (Signed)
Please schedule an office visit with any provider.  Thanks °

## 2020-02-27 ENCOUNTER — Encounter: Payer: Self-pay | Admitting: Internal Medicine

## 2020-02-27 ENCOUNTER — Ambulatory Visit: Payer: BC Managed Care – PPO | Admitting: Internal Medicine

## 2020-02-27 ENCOUNTER — Ambulatory Visit (INDEPENDENT_AMBULATORY_CARE_PROVIDER_SITE_OTHER): Payer: BC Managed Care – PPO

## 2020-02-27 ENCOUNTER — Other Ambulatory Visit: Payer: Self-pay

## 2020-02-27 DIAGNOSIS — E559 Vitamin D deficiency, unspecified: Secondary | ICD-10-CM | POA: Diagnosis not present

## 2020-02-27 DIAGNOSIS — E538 Deficiency of other specified B group vitamins: Secondary | ICD-10-CM | POA: Diagnosis not present

## 2020-02-27 DIAGNOSIS — R634 Abnormal weight loss: Secondary | ICD-10-CM | POA: Diagnosis not present

## 2020-02-27 DIAGNOSIS — R5383 Other fatigue: Secondary | ICD-10-CM

## 2020-02-27 NOTE — Progress Notes (Signed)
Subjective:  Patient ID: [name], female    DOB: 09/20/1975  Age: 44 y.o. MRN: [id number]  CC: No chief complaint on file.   HPI Marcela Alatorre presents for wt loss of 15 lbs in 6 mo C/o poor appetite On Adderall bid - Dr Tommi Emery  C/o night sweats, fatigue   Outpatient Medications Prior to Visit  Medication Sig Dispense Refill  . ADDERALL XR 25 MG 24 hr capsule     . ADDERALL XR 30 MG 24 hr capsule     . ALPRAZolam (XANAX) 0.5 MG tablet Take 0.5 mg by mouth at bedtime as needed for anxiety.    [name] amLODipine-olmesartan (AZOR) 5-20 MG tablet Take 1 tablet by mouth daily. 30 tablet 11  . buPROPion (WELLBUTRIN XL) 300 MG 24 hr tablet Take 300 mg by mouth daily.    . cyanocobalamin (,VITAMIN B-12,) 1000 MCG/ML injection Inject 1 mL (1,000 mcg total) into the skin every 14 (fourteen) days. 10 mL 6  . Levonorgestrel-Ethinyl Estradiol (SEASONIQUE) 0.15-0.03 &0.01 MG tablet Take 1 tablet by mouth daily.    [name] omeprazole (PRILOSEC) 20 MG capsule Take 1 capsule (20 mg total) by mouth 2 (two) times daily before a meal. 60 capsule 3  . albuterol (PROVENTIL HFA;VENTOLIN HFA) 108 (90 Base) MCG/ACT inhaler Inhale 2 puffs into the lungs every 4 (four) hours as needed for wheezing or shortness of breath. 1 Inhaler 0  . Amphetamine ER (ADZENYS XR-ODT) 18.8 MG TBED Take by mouth.    [name] amphetamine-dextroamphetamine (ADDERALL) 30 MG tablet Take 25 mg by mouth daily.   0  . cyclobenzaprine (FLEXERIL) 10 MG tablet   0  . ibuprofen (ADVIL,MOTRIN) 800 MG tablet Take 1 tablet (800 mg total) by mouth every 8 (eight) hours as needed. 30 tablet 0   No facility-administered medications prior to visit.    ROS: Review of Systems  Constitutional: Positive for diaphoresis, fatigue and unexpected weight change. Negative for activity change, appetite change and chills.  HENT: Negative for congestion, mouth sores and sinus pressure.   Eyes: Negative for visual disturbance.  Respiratory: Negative for cough and  chest tightness.   Gastrointestinal: Negative for abdominal pain and nausea.  Genitourinary: Negative for difficulty urinating, frequency and vaginal pain.  Musculoskeletal: Negative for back pain and gait problem.  Skin: Negative for pallor and rash.  Neurological: Negative for dizziness, tremors, weakness, numbness and headaches.  Psychiatric/Behavioral: Positive for decreased concentration. Negative for confusion, sleep disturbance and suicidal ideas.    Objective:  BP 116/78 (BP Location: Left Arm, Patient Position: Sitting, Cuff Size: Normal)   Pulse 97   Temp 98.6 F (37 C) (Oral)   Ht 5\' 6"  (1.676 m)   Wt 141 lb (64 kg)   SpO2 99%   BMI 22.76 kg/m   BP Readings from Last 3 Encounters:  02/27/20 116/78  06/18/19 (!) 146/98  05/28/19 (!) 154/110    Wt Readings from Last 3 Encounters:  02/27/20 141 lb (64 kg)  06/18/19 154 lb (69.9 kg)  05/28/19 150 lb (68 kg)    Physical Exam Constitutional:      General: She is not in acute distress.    Appearance: She is well-developed.  HENT:     Head: Normocephalic.     Right Ear: External ear normal.     Left Ear: External ear normal.     Nose: Nose normal.  Eyes:     General:        Right eye: No discharge.  Left eye: No discharge.     Conjunctiva/sclera: Conjunctivae normal.     Pupils: Pupils are equal, round, and reactive to light.  Neck:     Thyroid: No thyromegaly.     Vascular: No JVD.     Trachea: No tracheal deviation.  Cardiovascular:     Rate and Rhythm: Normal rate and regular rhythm.     Heart sounds: Normal heart sounds.  Pulmonary:     Effort: No respiratory distress.     Breath sounds: No stridor. No wheezing.  Abdominal:     General: Bowel sounds are normal. There is no distension.     Palpations: Abdomen is soft. There is no mass.     Tenderness: There is no abdominal tenderness. There is no guarding or rebound.  Musculoskeletal:        General: No tenderness.     Cervical back: Normal  range of motion and neck supple.  Lymphadenopathy:     Cervical: No cervical adenopathy.  Skin:    Findings: No erythema or rash.  Neurological:     Mental Status: She is oriented to person, place, and time.     Cranial Nerves: No cranial nerve deficit.     Motor: No abnormal muscle tone.     Coordination: Coordination normal.     Deep Tendon Reflexes: Reflexes normal.  Psychiatric:        Behavior: Behavior normal.        Thought Content: Thought content normal.        Judgment: Judgment normal.    Thin  Lab Results  Component Value Date   WBC 7.1 05/28/2019   HGB 13.1 05/28/2019   HCT 40.8 05/28/2019   PLT 331 05/28/2019   GLUCOSE 95 06/18/2019   CHOL 167 02/21/2019   TRIG 114.0 02/21/2019   HDL 55.80 02/21/2019   LDLCALC 89 02/21/2019   ALT 8 02/21/2019   AST 11 02/21/2019   NA 137 06/18/2019   K 3.8 06/18/2019   CL 104 06/18/2019   CREATININE 0.86 06/18/2019   BUN 13 06/18/2019   CO2 25 06/18/2019   TSH 2.62 02/21/2019    DG Chest 2 View  Result Date: 05/28/2019 CLINICAL DATA:  Chest pain and dizziness.  Hypertension. EXAM: CHEST - 2 VIEW COMPARISON:  Dec 08, 2012. FINDINGS: Lungs are clear. Heart size and pulmonary vascularity are normal. No adenopathy. No bone lesions. No pneumothorax. IMPRESSION: No edema or consolidation. Electronically Signed   By: Bretta Bang III M.D.   On: 05/28/2019 12:36   CT Head Wo Contrast  Result Date: 05/28/2019 CLINICAL DATA:  Per patient "headache with hypertension, and tingling down her left arm. No previous history of hypertension, patient doctor sent her to the ED" EXAM: CT HEAD WITHOUT CONTRAST TECHNIQUE: Contiguous axial images were obtained from the base of the skull through the vertex without intravenous contrast. COMPARISON:  None. FINDINGS: Brain: No evidence of acute infarction, hemorrhage, hydrocephalus, extra-axial collection or mass lesion/mass effect. Vascular: No hyperdense vessel or unexpected calcification.  Skull: Normal. Negative for fracture or focal lesion. Sinuses/Orbits: Visualized globes and orbits are unremarkable. The visualized sinuses and mastoid air cells are clear. Other: None. IMPRESSION: Normal enhanced CT scan of the brain. Electronically Signed   By: Amie Portland M.D.   On: 05/28/2019 12:45    Assessment & Plan:   There are no diagnoses linked to this encounter.   No orders of the defined types were placed in this encounter.    Follow-up:  No follow-ups on file.  Walker Kehr, MD

## 2020-02-27 NOTE — Assessment & Plan Note (Signed)
?  adderall related vs other Labs, CXR F/u w/Dr Tommi Emery

## 2020-02-27 NOTE — Assessment & Plan Note (Signed)
New Labs CXR

## 2020-02-27 NOTE — Assessment & Plan Note (Signed)
Vit D Labs 

## 2020-02-27 NOTE — Addendum Note (Signed)
Addended by: Simmie Davies on: 02/27/2020 04:15 PM   Modules accepted: Orders

## 2020-02-27 NOTE — Assessment & Plan Note (Signed)
On B12 Labs 

## 2020-02-28 LAB — CBC WITH DIFFERENTIAL/PLATELET
Absolute Monocytes: 378 cells/uL (ref 200–950)
Basophils Absolute: 50 cells/uL (ref 0–200)
Basophils Relative: 0.8 %
Eosinophils Absolute: 32 cells/uL (ref 15–500)
Eosinophils Relative: 0.5 %
HCT: 40 % (ref 35.0–45.0)
Hemoglobin: 13.3 g/dL (ref 11.7–15.5)
Lymphs Abs: 1525 cells/uL (ref 850–3900)
MCH: 31.4 pg (ref 27.0–33.0)
MCHC: 33.3 g/dL (ref 32.0–36.0)
MCV: 94.3 fL (ref 80.0–100.0)
MPV: 10.1 fL (ref 7.5–12.5)
Monocytes Relative: 6 %
Neutro Abs: 4316 cells/uL (ref 1500–7800)
Neutrophils Relative %: 68.5 %
Platelets: 342 10*3/uL (ref 140–400)
RBC: 4.24 10*6/uL (ref 3.80–5.10)
RDW: 12.3 % (ref 11.0–15.0)
Total Lymphocyte: 24.2 %
WBC: 6.3 10*3/uL (ref 3.8–10.8)

## 2020-02-28 LAB — COMPLETE METABOLIC PANEL WITH GFR
AG Ratio: 1.8 (calc) (ref 1.0–2.5)
ALT: 8 U/L (ref 6–29)
AST: 10 U/L (ref 10–30)
Albumin: 4.5 g/dL (ref 3.6–5.1)
Alkaline phosphatase (APISO): 32 U/L (ref 31–125)
BUN: 18 mg/dL (ref 7–25)
CO2: 25 mmol/L (ref 20–32)
Calcium: 9.6 mg/dL (ref 8.6–10.2)
Chloride: 106 mmol/L (ref 98–110)
Creat: 1.09 mg/dL (ref 0.50–1.10)
GFR, Est African American: 72 mL/min/{1.73_m2} (ref >=60)
GFR, Est Non African American: 62 mL/min/{1.73_m2} (ref >=60)
Globulin: 2.5 g/dL (calc) (ref 1.9–3.7)
Glucose, Bld: 92 mg/dL (ref 65–99)
Potassium: 4.4 mmol/L (ref 3.5–5.3)
Sodium: 136 mmol/L (ref 135–146)
Total Bilirubin: 0.3 mg/dL (ref 0.2–1.2)
Total Protein: 7 g/dL (ref 6.1–8.1)

## 2020-02-28 LAB — URINALYSIS
Bilirubin Urine: NEGATIVE
Glucose, UA: NEGATIVE
Hgb urine dipstick: NEGATIVE
Ketones, ur: NEGATIVE
Leukocytes,Ua: NEGATIVE
Nitrite: NEGATIVE
Protein, ur: NEGATIVE
Specific Gravity, Urine: 1.034 (ref 1.001–1.03)
pH: 5.5 (ref 5.0–8.0)

## 2020-02-28 LAB — T4, FREE: Free T4: 1 ng/dL (ref 0.8–1.8)

## 2020-02-28 LAB — VITAMIN D 25 HYDROXY (VIT D DEFICIENCY, FRACTURES): Vit D, 25-Hydroxy: 30 ng/mL (ref 30–100)

## 2020-02-28 LAB — VITAMIN B12: Vitamin B-12: 535 pg/mL (ref 200–1100)

## 2020-02-28 LAB — TSH: TSH: 5.19 mIU/L — ABNORMAL HIGH

## 2020-03-09 IMAGING — CT CT HEAD W/O CM
3 series · 16 of 47 positions shown, 19 images · non-contrast
Comparison: None.

CLINICAL DATA: Per patient "headache with hypertension, and
tingling down her left arm. No previous history of hypertension,
patient doctor Nico her to the ED"

EXAM:
CT HEAD WITHOUT CONTRAST
TECHNIQUE: Contiguous axial images were obtained from the base of the skull
through the vertex without intravenous contrast.

[Series 2: head wo · axial · 0.47mm/px · z∈[-49,+76]mm · 10 of 31 slices shown, 13 images]
[im 3/31  brain]
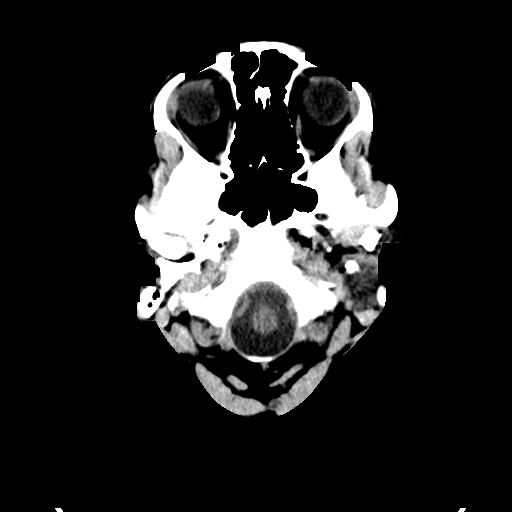
[im 3/31  bone]
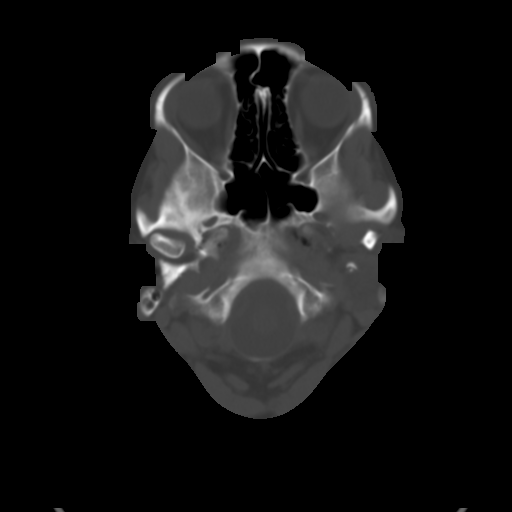
[im 6/31  brain]
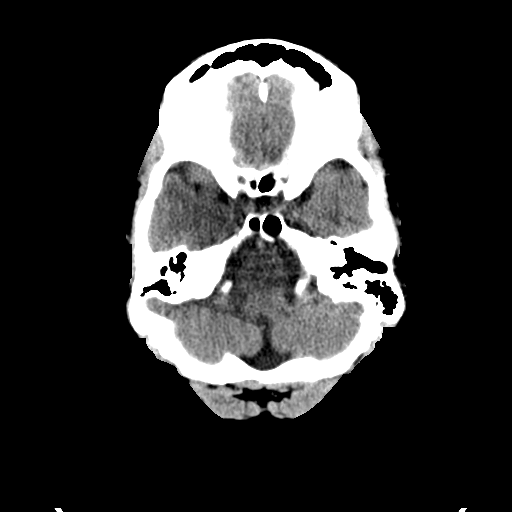
[im 9/31  brain]
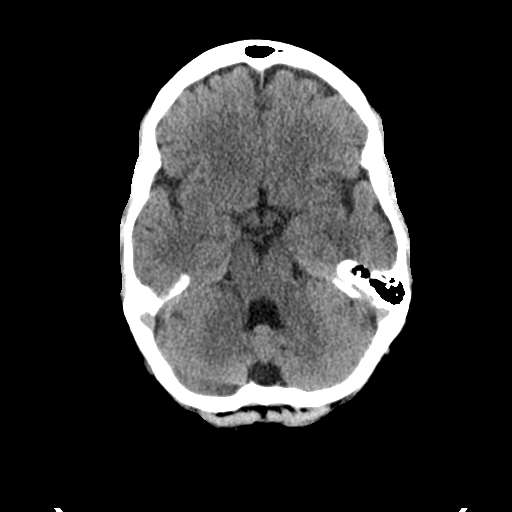
[im 11/31  brain]
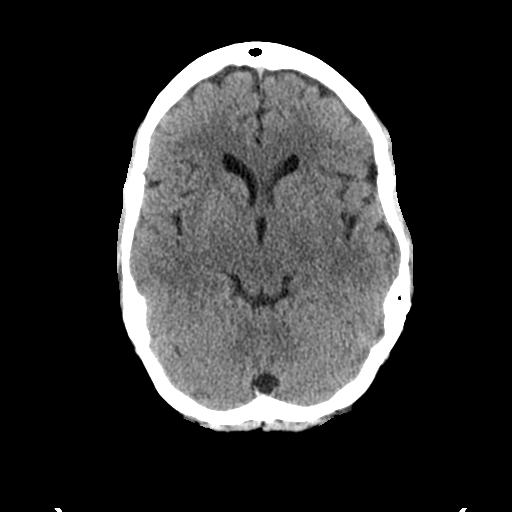
[im 14/31  brain]
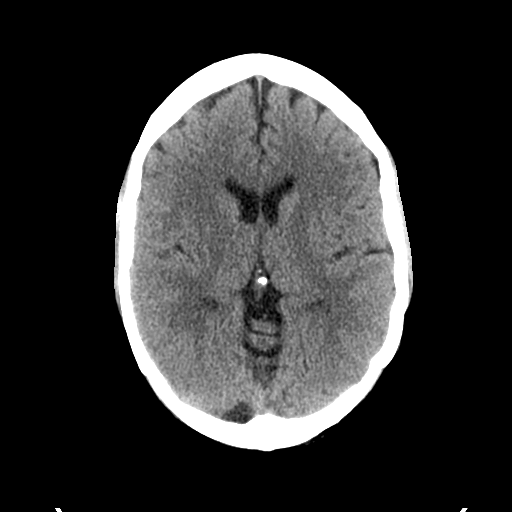
[im 14/31  bone]
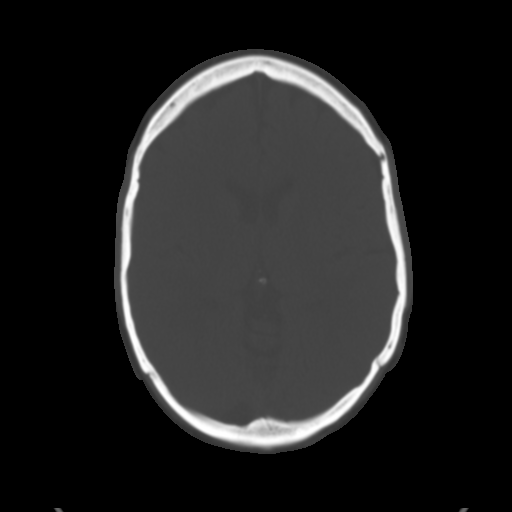
[im 17/31  brain]
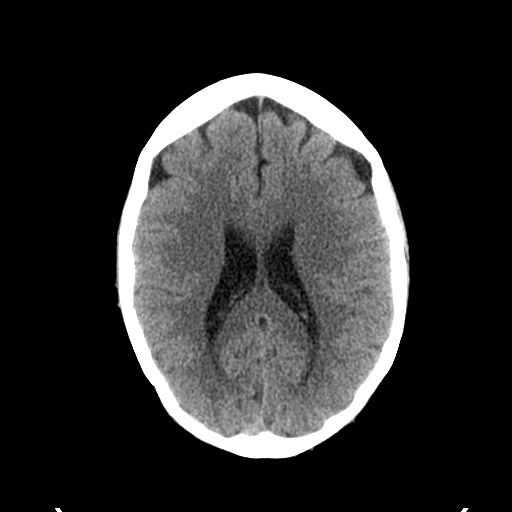
[im 20/31  brain]
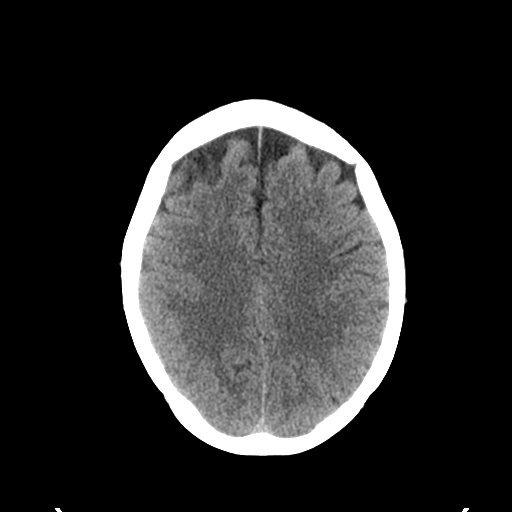
[im 23/31  brain]
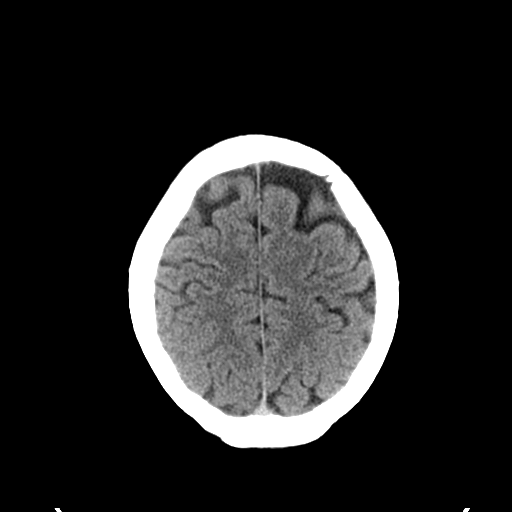
[im 25/31  brain]
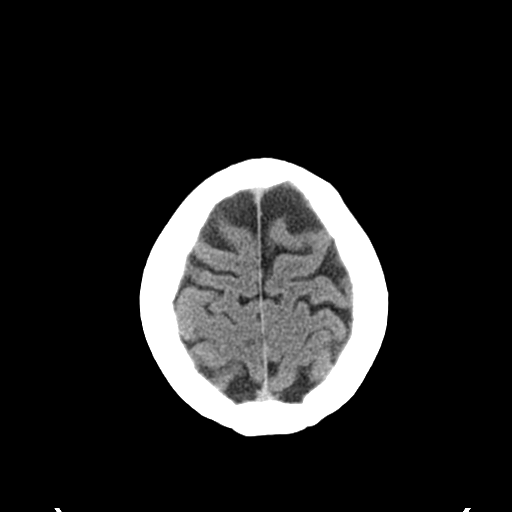
[im 25/31  bone]
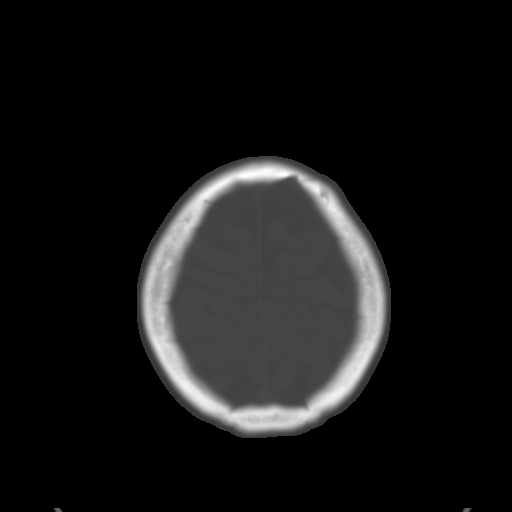
[im 28/31  brain]
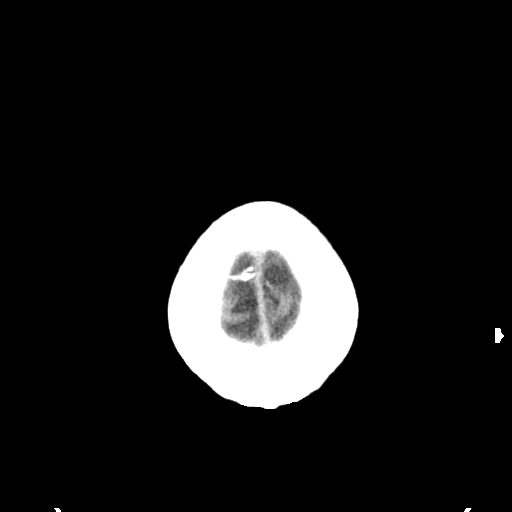

[Series 5: coronal soft tissue · coronal · 0.32mm/px · 3 of 68 slices shown]
[im 23/68  brain]
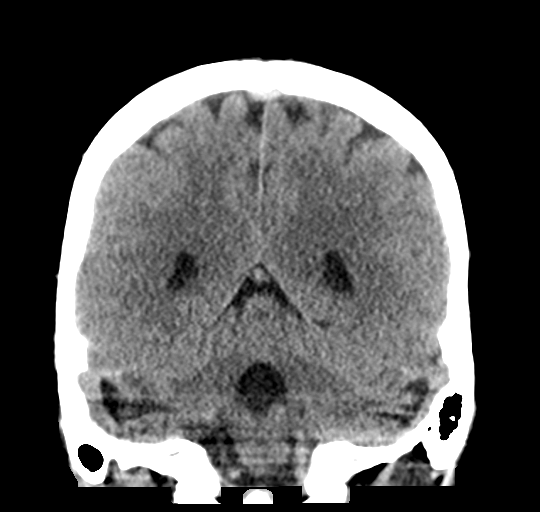
[im 30/68  brain]
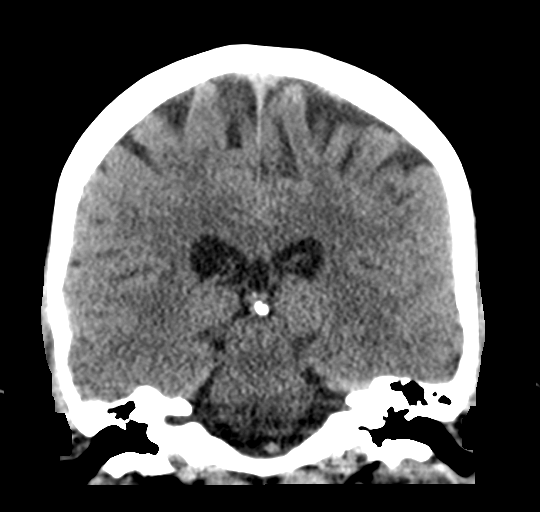
[im 38/68  brain]
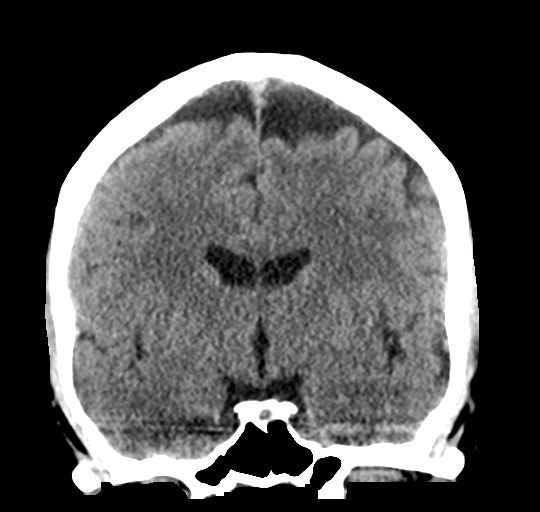

[Series 6: sagittal soft tissue · sagittal · 0.32mm/px · 3 of 58 slices shown]
[im 20/58  brain]
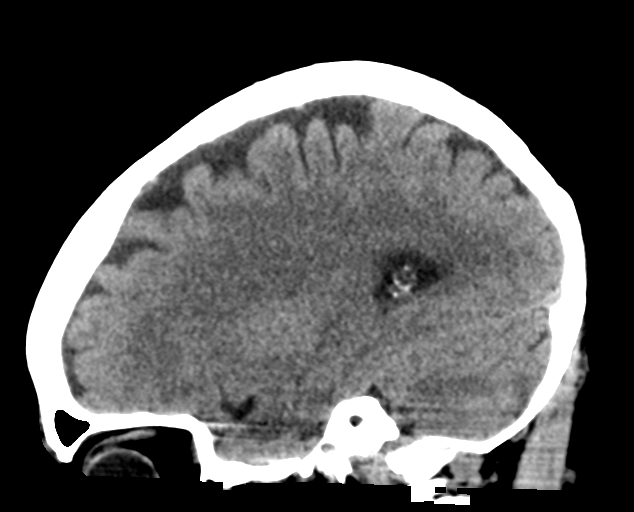
[im 29/58  brain]
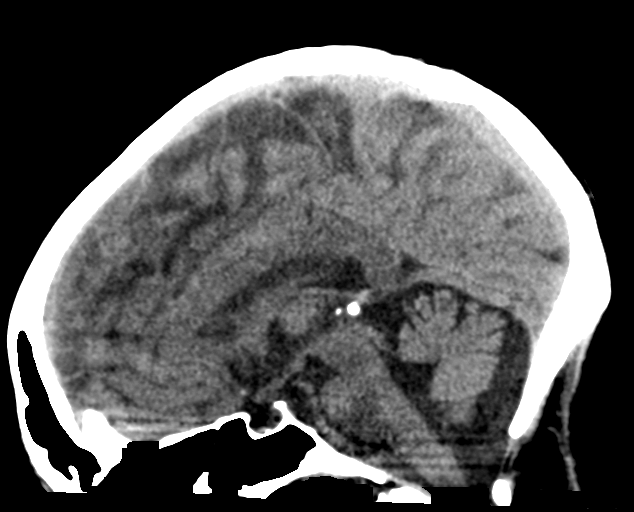
[im 39/58  brain]
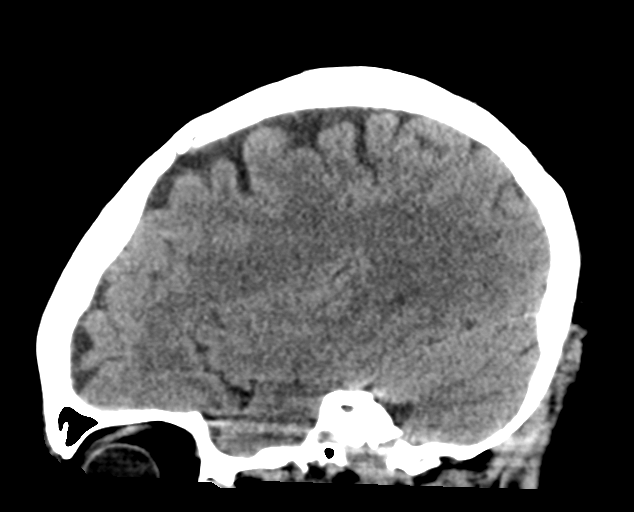

[16 of 47 positions shown; findings below may reference images not displayed]

FINDINGS: Brain: No evidence of acute infarction, hemorrhage, hydrocephalus,
extra-axial collection or mass lesion/mass effect.

Vascular: No hyperdense vessel or unexpected calcification.

Skull: Normal. Negative for fracture or focal lesion.

Sinuses/Orbits: Visualized globes and orbits are unremarkable. The
visualized sinuses and mastoid air cells are clear.

Other: None.
IMPRESSION: Normal enhanced CT scan of the brain.

## 2020-04-10 DIAGNOSIS — F902 Attention-deficit hyperactivity disorder, combined type: Secondary | ICD-10-CM | POA: Diagnosis not present

## 2020-04-10 DIAGNOSIS — F419 Anxiety disorder, unspecified: Secondary | ICD-10-CM | POA: Diagnosis not present

## 2020-04-10 DIAGNOSIS — Z79899 Other long term (current) drug therapy: Secondary | ICD-10-CM | POA: Diagnosis not present

## 2020-05-08 DIAGNOSIS — Z1231 Encounter for screening mammogram for malignant neoplasm of breast: Secondary | ICD-10-CM | POA: Diagnosis not present

## 2020-05-08 DIAGNOSIS — Z6822 Body mass index (BMI) 22.0-22.9, adult: Secondary | ICD-10-CM | POA: Diagnosis not present

## 2020-05-08 DIAGNOSIS — Z01419 Encounter for gynecological examination (general) (routine) without abnormal findings: Secondary | ICD-10-CM | POA: Diagnosis not present

## 2020-06-17 ENCOUNTER — Other Ambulatory Visit: Payer: Self-pay | Admitting: Internal Medicine

## 2020-07-02 DIAGNOSIS — H353131 Nonexudative age-related macular degeneration, bilateral, early dry stage: Secondary | ICD-10-CM | POA: Diagnosis not present

## 2020-07-02 DIAGNOSIS — H5213 Myopia, bilateral: Secondary | ICD-10-CM | POA: Diagnosis not present

## 2020-07-02 DIAGNOSIS — H40013 Open angle with borderline findings, low risk, bilateral: Secondary | ICD-10-CM | POA: Diagnosis not present

## 2020-07-14 DIAGNOSIS — Z79899 Other long term (current) drug therapy: Secondary | ICD-10-CM | POA: Diagnosis not present

## 2020-07-14 DIAGNOSIS — F902 Attention-deficit hyperactivity disorder, combined type: Secondary | ICD-10-CM | POA: Diagnosis not present

## 2020-07-14 DIAGNOSIS — F419 Anxiety disorder, unspecified: Secondary | ICD-10-CM | POA: Diagnosis not present

## 2020-07-17 ENCOUNTER — Other Ambulatory Visit: Payer: Self-pay | Admitting: Internal Medicine

## 2020-07-28 ENCOUNTER — Encounter: Payer: Self-pay | Admitting: Family Medicine

## 2020-07-28 ENCOUNTER — Ambulatory Visit: Payer: BC Managed Care – PPO | Admitting: Family Medicine

## 2020-07-28 ENCOUNTER — Other Ambulatory Visit: Payer: Self-pay

## 2020-07-28 ENCOUNTER — Other Ambulatory Visit: Payer: Self-pay | Admitting: Family Medicine

## 2020-07-28 ENCOUNTER — Ambulatory Visit (INDEPENDENT_AMBULATORY_CARE_PROVIDER_SITE_OTHER): Payer: BC Managed Care – PPO

## 2020-07-28 VITALS — BP 92/68 | HR 92 | Ht 66.0 in | Wt 139.6 lb

## 2020-07-28 DIAGNOSIS — R7989 Other specified abnormal findings of blood chemistry: Secondary | ICD-10-CM

## 2020-07-28 DIAGNOSIS — M542 Cervicalgia: Secondary | ICD-10-CM

## 2020-07-28 DIAGNOSIS — M8448XS Pathological fracture, other site, sequela: Secondary | ICD-10-CM

## 2020-07-28 NOTE — Progress Notes (Signed)
X-ray cervical spine does not appear to be much changed from the CT scan.  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  If not better with physical therapy MRI should tell us more.

## 2020-07-28 NOTE — Progress Notes (Signed)
I, Christoper Fabian, LAT, ATC, am serving as scribe for Dr. Clementeen Graham.   [name] is a 45 y.o. female who presents to [facility] today for back, neck and shoulder pain. Pt reports neck pain is chronic but has been recently flared up for the past few months . Pt locates pain to her L upper T-spine/lower C-spine w/ radiating pain that shoots up toward her neck and occiput.  I treated the patient for a sacral insufficiency fracture in 2018.  Patient was somewhat lost to follow-up and had improved by the time I saw her again in 2019.  She never did have bone mineral density testing performed.  She is interested in proceeding with that if possible.  UE numbness/tingling: yes in her L hand intermittently UE weakness: No Radiating: yes, up toward her neck and occiput Aggravates: neck AROM; pressure to the area Rx tried: IBU; IcyHot  Diagnostic imaging: C-spine CT- 08/01/17; C-spine XR- 12/08/12   Pertinent review of systems: No fevers or chills  Relevant historical information: Sacral insufficiency fracture  Exam:  BP 92/68 (BP Location: Right Arm, Patient Position: Sitting, Cuff Size: Normal)   Pulse 92   Ht 5\' 6"  (1.676 m)   Wt 139 lb 9.6 oz (63.3 kg)   SpO2 99%   BMI 22.53 kg/m  General: Well Developed, well nourished, and in no acute distress.   MSK: C-spine normal-appearing nontender midline. .  Normal cervical motion. Tender palpation left cervical paraspinal musculature and trapezius. Upper extremity strength reflexes and sensation are equal normal throughout. Pulses cap refill and sensation are intact distally.   Lab and Radiology Results  DG Cervical Spine 2 or 3 views  Result Date: 07/28/2020 CLINICAL DATA:  Left side neck pain for 2 months. EXAM: CERVICAL SPINE - 2-3 VIEW COMPARISON:  CT cervical spine 08/01/2017. Plain film cervical spine 12/08/2012. FINDINGS: Again seen is failure of segmentation of the C5-6 vertebral bodies  consistent with Klippel-Feil deformity. No fracture or malalignment. Mild loss of disc space height and endplate spurring at C6-7 appear unchanged. Prevertebral soft tissues appear normal. Lung apices are clear. 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 Electronically Signed   By: 12/10/2012 M.D.   On: 07/28/2020 13:25     EXAM: CT CERVICAL SPINE WITHOUT CONTRAST  TECHNIQUE: Multidetector CT imaging of the cervical spine was performed without intravenous contrast. Multiplanar CT image reconstructions were also generated.  COMPARISON:  None.  FINDINGS: Alignment: Normal.  Skull base and vertebrae: No acute fracture. No primary bone lesion or focal pathologic process. 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  Soft tissues and spinal canal: No prevertebral fluid or swelling. No visible canal hematoma.  Disc levels: Partial effacement of C5-C6 intervertebral space, otherwise normal.  Upper chest: Negative.  Other: None.  IMPRESSION: No evidence of acute fracture.  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   Electronically Signed   By: 09/25/2020 M.D.   On: 08/01/2017 16:51  I, 09/29/2017, personally (independently) visualized and performed the interpretation of the images attached in this note.  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  She should be a good candidate for physical therapy.  Will refer to physical PT.  Update cervical spine imaging with x-ray today.  Consider MRI in the future.  Patient has a history of sacral insufficiency fracture with a  history of low vitamin D.  Plan to proceed with DEXA scan to evaluate for low bone mineral density.   PDMP not reviewed this encounter. Orders Placed This Encounter  Procedures  . DG Cervical Spine 2 or 3 views    Standing Status:   Future    Number of Occurrences:   1    Standing Expiration Date:   07/28/2021    Order Specific Question:   Reason for Exam (SYMPTOM  OR DIAGNOSIS REQUIRED)    Answer:   eval neck pain. Hx Congential fusion C5-C6    Order Specific Question:   Is patient pregnant?    Answer:   No    Order Specific Question:   Preferred imaging location?    Answer:   [facility]  . DG Bone Density    Standing Status:   Future    Standing Expiration Date:   07/28/2021    Order Specific Question:   Reason for Exam (SYMPTOM  OR DIAGNOSIS REQUIRED)    Answer:   eval bone mineral density    Order Specific Question:   Is the patient pregnant?    Answer:   No    Order Specific Question:   Preferred imaging location?    Answer:   [facility]  . Ambulatory referral to Physical Therapy    Referral Priority:   Routine    Referral Type:   Physical Medicine    Referral Reason:   Specialty Services Required    Requested Specialty:   Physical Therapy   No orders of the defined types were placed in this encounter.    Discussed warning signs or symptoms. Please see discharge instructions. Patient expresses understanding.   The above documentation has been reviewed and is accurate and complete Clementeen Graham, M.D.

## 2020-07-28 NOTE — Patient Instructions (Signed)
Thank you for coming in today.  Plan for xray today and PT of your neck.   Arrange for the bone density test to follow up that sacral insufficiently fracture.  Recommend Vit D3 at around 1000-5000 units a day.   Recheck with me as needed especially if not improving.   Could consider MRI neck in the future if needed.    Get a hands free headset for work.    Cervical Strain and Sprain Rehab Ask your health care provider which exercises are safe for you. Do exercises exactly as told by your health care provider and adjust them as directed. It is normal to feel mild stretching, pulling, tightness, or discomfort as you do these exercises. Stop right away if you feel sudden pain or your pain gets worse. Do not begin these exercises until told by your health care provider. Stretching and range-of-motion exercises Cervical side bending  1. Using good posture, sit on a stable chair or stand up. 2. Without moving your shoulders, slowly tilt your left / right ear to your shoulder until you feel a stretch in the opposite side neck muscles. You should be looking straight ahead. 3. Hold for __________ seconds. 4. Repeat with the other side of your neck. Repeat __________ times. Complete this exercise __________ times a day. Cervical rotation  1. Using good posture, sit on a stable chair or stand up. 2. Slowly turn your head to the side as if you are looking over your left / right shoulder. ? Keep your eyes level with the ground. ? Stop when you feel a stretch along the side and the back of your neck. 3. Hold for __________ seconds. 4. Repeat this by turning to your other side. Repeat __________ times. Complete this exercise __________ times a day. Thoracic extension and pectoral stretch 1. Roll a towel or a small blanket so it is about 4 inches (10 cm) in diameter. 2. Lie down on your back on a firm surface. 3. Put the towel lengthwise, under your spine in the middle of your back. It should  not be under your shoulder blades. The towel should line up with your spine from your middle back to your lower back. 4. Put your hands behind your head and let your elbows fall out to your sides. 5. Hold for __________ seconds. Repeat __________ times. Complete this exercise __________ times a day. Strengthening exercises Isometric upper cervical flexion 1. Lie on your back with a thin pillow behind your head and a small rolled-up towel under your neck. 2. Gently tuck your chin toward your chest and nod your head down to look toward your feet. Do not lift your head off the pillow. 3. Hold for __________ seconds. 4. Release the tension slowly. Relax your neck muscles completely before you repeat this exercise. Repeat __________ times. Complete this exercise __________ times a day. Isometric cervical extension  1. Stand about 6 inches (15 cm) away from a wall, with your back facing the wall. 2. Place a soft object, about 6-8 inches (15-20 cm) in diameter, between the back of your head and the wall. A soft object could be a small pillow, a ball, or a folded towel. 3. Gently tilt your head back and press into the soft object. Keep your jaw and forehead relaxed. 4. Hold for __________ seconds. 5. Release the tension slowly. Relax your neck muscles completely before you repeat this exercise. Repeat __________ times. Complete this exercise __________ times a day. Posture and body mechanics Body  mechanics refers to the movements and positions of your body while you do your daily activities. Posture is part of body mechanics. Good posture and healthy body mechanics can help to relieve stress in your body's tissues and joints. Good posture means that your spine is in its natural S-curve position (your spine is neutral), your shoulders are pulled back slightly, and your head is not tipped forward. The following are general guidelines for applying improved posture and body mechanics to your everyday  activities. Sitting  1. When sitting, keep your spine neutral and keep your feet flat on the floor. Use a footrest, if necessary, and keep your thighs parallel to the floor. Avoid rounding your shoulders, and avoid tilting your head forward. 2. When working at a desk or a computer, keep your desk at a height where your hands are slightly lower than your elbows. Slide your chair under your desk so you are close enough to maintain good posture. 3. When working at a computer, place your monitor at a height where you are looking straight ahead and you do not have to tilt your head forward or downward to look at the screen. Standing   When standing, keep your spine neutral and keep your feet about hip-width apart. Keep a slight bend in your knees. Your ears, shoulders, and hips should line up.  When you do a task in which you stand in one place for a long time, place one foot up on a stable object that is 2-4 inches (5-10 cm) high, such as a footstool. This helps keep your spine neutral. Resting When lying down and resting, avoid positions that are most painful for you. Try to support your neck in a neutral position. You can use a contour pillow or a small rolled-up towel. Your pillow should support your neck but not push on it. This information is not intended to replace advice given to you by your health care provider. Make sure you discuss any questions you have with your health care provider. Document Revised: 11/01/2018 Document Reviewed: 04/12/2018 Elsevier Patient Education  2020 [employer].

## 2020-08-04 DIAGNOSIS — H353131 Nonexudative age-related macular degeneration, bilateral, early dry stage: Secondary | ICD-10-CM | POA: Diagnosis not present

## 2020-08-04 DIAGNOSIS — H40013 Open angle with borderline findings, low risk, bilateral: Secondary | ICD-10-CM | POA: Diagnosis not present

## 2020-08-04 DIAGNOSIS — H532 Diplopia: Secondary | ICD-10-CM | POA: Diagnosis not present

## 2020-08-04 DIAGNOSIS — H5000 Unspecified esotropia: Secondary | ICD-10-CM | POA: Diagnosis not present

## 2020-08-13 ENCOUNTER — Ambulatory Visit: Payer: BC Managed Care – PPO | Admitting: Physical Therapy

## 2020-08-25 ENCOUNTER — Ambulatory Visit (INDEPENDENT_AMBULATORY_CARE_PROVIDER_SITE_OTHER): Payer: BC Managed Care – PPO | Admitting: Physical Therapy

## 2020-08-25 ENCOUNTER — Encounter: Payer: Self-pay | Admitting: Physical Therapy

## 2020-08-25 ENCOUNTER — Other Ambulatory Visit: Payer: Self-pay

## 2020-08-25 DIAGNOSIS — M542 Cervicalgia: Secondary | ICD-10-CM

## 2020-08-25 DIAGNOSIS — M436 Torticollis: Secondary | ICD-10-CM

## 2020-08-25 NOTE — Therapy (Addendum)
[facility] [address] [place], [place], [address] Phone: [phone]   Fax:  [phone]  Physical Therapy Evaluation/Discharge  Patient Details  Name: [name] MRN: [id number] Date of Birth: 29-Dec-1975 Referring Provider (PT): Lynne Leader, MD   Encounter Date: 08/25/2020   PT End of Session - 08/25/20 1619     Visit Number 1    Number of Visits 8    Date for PT Re-Evaluation 10/23/20    PT Start Time 5176    PT Stop Time 1515    PT Time Calculation (min) 33 min    Activity Tolerance Patient tolerated treatment well    Behavior During Therapy [facility] for tasks assessed/performed             Past Medical History:  Diagnosis Date   Depression    LBP (low back pain) 2011   Vitamin B12 deficiency 2012   Vitamin D deficiency 2012    Past Surgical History:  Procedure Laterality Date   CESAREAN SECTION  1-08    There were no vitals filed for this visit.    Subjective Assessment - 08/25/20 1613     Subjective Pt arriving to therpay reporting onset of neck pain with radiation down UE"s with pressure points in upper trap. Pt reporting she has recently experienced weight loss and has been running. Pt stating  no pain at rest upon arrival but pain can increase to 7-8/10 at times. 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    Limitations House hold activities;Lifting    Diagnostic tests Pt is being sent of bone density tests    Patient Stated Goals have less pain    Currently in Pain? Yes    Pain Score 8    at times, no pain at rest today.   Pain Location Neck    Pain Orientation Mid;Lower    Pain Descriptors / Indicators Aching;Sore;Sharp    Pain Type Acute pain    Pain Onset 1 to 4 weeks ago    Pain Frequency Intermittent    Aggravating Factors  certain sleeping positions, after working    Pain Relieving Factors PTC pain medications, chanding positions    Effect of Pain on Daily Activities difficulty with  ADL's and work                [facility] PT Assessment - 08/25/20 0001       Assessment   Medical Diagnosis cervicalgia, M54.2    Referring Provider (PT) Lynne Leader, MD    Next MD Visit follow up as needed      Precautions   Precautions None      Balance Screen   Has the patient fallen in the past 6 months No    Has the patient had a decrease in activity level because of a fear of falling?  No    Is the patient reluctant to leave their home because of a fear of falling?  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    Leisure would like to get back to running      Cognition   Overall Cognitive Status Within Functional Limits for tasks assessed      Observation/Other Assessments   Focus on Therapeutic Outcomes (FOTO)  72% (predicted 76 %)      Posture/Postural Control  Posture Comments fwd head, rounded shoulders      ROM / Strength   AROM / PROM / Strength AROM;Strength      AROM   Overall AROM  Deficits    AROM Assessment Site Cervical    Cervical Flexion 60    Cervical Extension 40    Cervical - Right Side Bend 20    Cervical - Left Side Bend 20    Cervical - Right Rotation 80    Cervical - Left Rotation 40      Strength   Overall Strength Comments bilateral shoulder strength grossly 4+/5.    Strength Assessment Site --      Palpation   Palpation comment active trigger points noted in bialteral upper trap and left levator.      Transfers   Comments Pt reporting pain with head turning and after working      Ambulation/Gait   Gait Comments normalized gait pattern                        Objective measurements completed on examination: See above findings.               PT Education - 08/25/20 1618     Education provided Yes    Education Details HEP, PT POC    Person(s) Educated Patient    Methods  Handout;Demonstration;Explanation;Verbal cues    Comprehension Verbalized understanding;Returned demonstration              PT Short Term Goals - 08/25/20 1621       PT SHORT TERM GOAL #1   Title independent with initial HEP    Time 2    Period Weeks    Status New    Target Date 09/12/20               PT Long Term Goals - 08/25/20 1621       PT LONG TERM GOAL #1   Title Pt will report decreased pain with ALD's and work of </= 3/10.    Time 8    Period Weeks    Status New    Target Date 10/23/20      PT LONG TERM GOAL #2   Title Pt will improve Left cervical rotation to >/= 60 degrees to improve functional mobility and driving safety.    Baseline see flowsheets    Time 8    Period Weeks    Status New    Target Date 10/23/20      PT LONG TERM GOAL #3   Title Pt will be able to perform posture correction independently for functional tasks.    Time 8    Period Weeks    Status New    Target Date 10/23/20      PT LONG TERM GOAL #4   Title Pt will be able to report 50% decrease in headaches.    Time 8    Period Weeks    Status New    Target Date 10/23/20                    Plan - 08/25/20 1624     Clinical Impression Statement Pt arriving to therapy 10 minutes late reporting recent onset of cerivical pain which she pin points to bilateral UE"s with history of radiation down bilateral UE's. Pt with decreased left cerival rotation compared to her right and decreased bilateral cerivical side bending. Pt also reporting frequency of head  aches. No weakness noted at present, but pt with pain noted during MMT of cervical spine. Pt tender to palpation over bilteral upper trap with acitve trigger points noted. Trigger points also noted in left levator. We discussed DN for next session. Pt was issued HEP. Recommending skilled PT to address pt's impairments 1x/ week for up to 8 weeks.    Examination-Activity Limitations Other    Examination-Participation  Restrictions Community Activity;Other    Stability/Clinical Decision Making Stable/Uncomplicated    Clinical Decision Making Low    Rehab Potential Good    PT Frequency 1x / week    PT Duration 8 weeks    PT Treatment/Interventions Cryotherapy;Electrical Stimulation;Iontophoresis 66m/ml Dexamethasone;Traction;Ultrasound;Moist Heat;Therapeutic activities;Functional mobility training;Therapeutic exercise;Neuromuscular re-education;Patient/family education;Manual techniques;Passive range of motion;Dry needling;Taping    PT Next Visit Plan Dry Needling, cervical ROM, posture exercises, Manual therapy as needed    PT Home Exercise Plan Access Code: [email or web address]    Consulted and Agree with Plan of Care Patient             Patient will benefit from skilled therapeutic intervention in order to improve the following deficits and impairments:  Decreased range of motion,Pain,Decreased activity tolerance,Decreased strength,Postural dysfunction,Increased muscle spasms  Visit Diagnosis: Cervicalgia  Stiffness of neck     Problem List Patient Active Problem List   Diagnosis Date Noted   Weight loss 02/27/2020   HTN (hypertension) 06/18/2019   Hyponatremia 06/18/2019   Suspected 2019 novel coronavirus infection 10/27/2018   Gastrocnemius muscle tear, left, initial encounter 01/17/2018   Bloating 10/11/2017   Sacral insufficiency fracture 02/01/2017   Shoulder pain, right 12/06/2016   Allergic rhinitis 11/24/2016   Acute sinus infection 11/24/2016   ADD (attention deficit disorder) 04/24/2015   Large breasts 10/01/2013   Scalp cyst 07/03/2013   Sacroiliac joint dysfunction of left side 04/11/2013   Nonallopathic lesion of cervical region 04/11/2013   Nonallopathic lesion of lumbosacral region 04/11/2013   Nonallopathic lesion of thoracic region 04/11/2013   Jaw dislocation 02/21/2012   Plantar fasciitis 02/09/2012   Abdominal tenderness, epigastric 07/01/2011   GERD (gastroesophageal  reflux disease) 07/01/2011   Well adult exam 01/21/2011   Low back pain 12/12/2010   Paresthesia 12/12/2010   Fatigue 12/12/2010   Vitamin B 12 deficiency 12/12/2010   Vitamin D deficiency 12/12/2010   Depression 12/12/2010    JOretha Caprice PT, MPT 08/25/2020, 4:42 PM   PHYSICAL THERAPY DISCHARGE SUMMARY  Visits from Start of Care: 1  Current functional level related to goals / functional outcomes: See note   Remaining deficits: See note   Education / Equipment: HEP   Patient agrees to discharge. Patient goals were not met. Patient is being discharged due to not returning since the last visit.  MScot Jun PT, DPT, OCS, ATC 01/08/21  1:36 PM    [facility] [address] [address]Phone: [phone]  Fax:  [phone] Name: Ebony KlettMRN: [id number] of Birth: 118-Nov-1977

## 2020-08-25 NOTE — Patient Instructions (Signed)
Access Code: [email or web address] URL: [email or web address] Date: 08/25/2020 Prepared by: Narda Amber  Exercises Seated Assisted Cervical Rotation with Towel - 2-3 x daily - 7 x weekly - 5 reps - 10 second hold Supine Chin Tuck - 2-3 x daily - 7 x weekly - 3 sets - 5 reps - 5 seconds hold Gentle Levator Scapulae Stretch - 2-3 x daily - 7 x weekly - 5 reps - 10 seconds hold Doorway Pec Stretch at 60 Elevation - 2-3 x daily - 7 x weekly - 10 reps - 10 seconds hold Doorway Pec Stretch at 90 Degrees Abduction - 2-3 x daily - 7 x weekly - 10 reps - 10 seconds hold

## 2020-08-27 ENCOUNTER — Telehealth (INDEPENDENT_AMBULATORY_CARE_PROVIDER_SITE_OTHER): Payer: BC Managed Care – PPO | Admitting: Family

## 2020-08-27 DIAGNOSIS — J209 Acute bronchitis, unspecified: Secondary | ICD-10-CM | POA: Diagnosis not present

## 2020-08-27 MED ORDER — BENZONATATE 100 MG PO CAPS: 100.0000 mg | ORAL_CAPSULE | Freq: Three times a day (TID) | ORAL | 0 refills | Status: DC | PRN

## 2020-08-27 MED ORDER — PREDNISONE 20 MG PO TABS: 20.0000 mg | ORAL_TABLET | Freq: Every day | ORAL | 0 refills | Status: DC

## 2020-08-27 MED ORDER — AZITHROMYCIN 250 MG PO TABS: ORAL_TABLET | ORAL | 0 refills | Status: DC

## 2020-08-27 NOTE — Progress Notes (Signed)
[name] is a 45 y.o. female with the following history as recorded in EpicCare:  Patient Active Problem List   Diagnosis Date Noted  . Weight loss 02/27/2020  . HTN (hypertension) 06/18/2019  . Hyponatremia 06/18/2019  . Suspected 2019 novel coronavirus infection 10/27/2018  . Gastrocnemius muscle tear, left, initial encounter 01/17/2018  . Bloating 10/11/2017  . Sacral insufficiency fracture 02/01/2017  . Shoulder pain, right 12/06/2016  . Allergic rhinitis 11/24/2016  . Acute sinus infection 11/24/2016  . ADD (attention deficit disorder) 04/24/2015  . Large breasts 10/01/2013  . Scalp cyst 07/03/2013  . Sacroiliac joint dysfunction of left side 04/11/2013  . Nonallopathic lesion of cervical region 04/11/2013  . Nonallopathic lesion of lumbosacral region 04/11/2013  . Nonallopathic lesion of thoracic region 04/11/2013  . Jaw dislocation 02/21/2012  . Plantar fasciitis 02/09/2012  . Abdominal tenderness, epigastric 07/01/2011  . GERD (gastroesophageal reflux disease) 07/01/2011  . Well adult exam 01/21/2011  . Low back pain 12/12/2010  . Paresthesia 12/12/2010  . Fatigue 12/12/2010  . Vitamin B 12 deficiency 12/12/2010  . Vitamin D deficiency 12/12/2010  . Depression 12/12/2010    Current Outpatient Medications  Medication Sig Dispense Refill  . azithromycin (ZITHROMAX) 250 MG tablet 2 tabs po qd x 1 day; 1 tablet per day x 4 days; 6 tablet 0  . benzonatate (TESSALON) 100 MG capsule Take 1 capsule (100 mg total) by mouth 3 (three) times daily as needed. 20 capsule 0  . predniSONE (DELTASONE) 20 MG tablet Take 1 tablet (20 mg total) by mouth daily with breakfast. 5 tablet 0  . ADDERALL XR 25 MG 24 hr capsule     . ADDERALL XR 30 MG 24 hr capsule     . ALPRAZolam (XANAX) 0.5 MG tablet Take 0.5 mg by mouth at bedtime as needed for anxiety.    [name] amLODipine-olmesartan (AZOR) 5-20 MG tablet TAKE 1 TABLET ONCE DAILY. 30 tablet 5  . buPROPion (WELLBUTRIN XL) 300 MG 24 hr  tablet Take 300 mg by mouth daily.    . cyanocobalamin (,VITAMIN B-12,) 1000 MCG/ML injection Inject 1 mL (1,000 mcg total) into the skin every 14 (fourteen) days. 10 mL 6  . Levonorgestrel-Ethinyl Estradiol (SEASONIQUE) 0.15-0.03 &0.01 MG tablet Take 1 tablet by mouth daily.    [name] omeprazole (PRILOSEC) 20 MG capsule Take 1 capsule (20 mg total) by mouth 2 (two) times daily before a meal. 60 capsule 3   No current facility-administered medications for this visit.    Allergies: Strattera [atomoxetine hcl]  Past Medical History:  Diagnosis Date  . Depression   . LBP (low back pain) 2011  . Vitamin B12 deficiency 2012  . Vitamin D deficiency 2012    Past Surgical History:  Procedure Laterality Date  . CESAREAN SECTION  1-08    Family History  Problem Relation Age of Onset  . Cancer Mother   . Kidney disease Other     Social History   Tobacco Use  . Smoking status: Former Smoker    Packs/day: 0.30    Years: 10.00    Pack years: 3.00    Types: Cigarettes  . Smokeless tobacco: Never Used  Substance Use Topics  . Alcohol use: No    Alcohol/week: 0.0 standard drinks    Subjective:   I connected with [name] on 08/27/20 at 11:20 AM EST by a video enabled telemedicine application and verified that I am speaking with the correct person using two identifiers.   I discussed the  limitations of evaluation and management by telemedicine and the availability of in person appointments. The patient expressed understanding and agreed to proceed. Provider in office/ patient is at home; provider and patient are only 2 people on video call.   Patient is suspicious that she had COVID 2 weeks ago; daughter tested positive and patient had similar symptoms; patient did not test at that time however; felt better for about 5 days and then symptoms have re-flared in past few days; complaining of cough/ congestion- limited benefit with Mucinex; feels like unable to get full deep breath;  LMP-  OCP/ vasectomy  Objective:  There were no vitals filed for this visit.  General: Well developed, well nourished, in no acute distress  Head: Normocephalic and atraumatic  Lungs: Respirations unlabored;  Neurologic: Alert and oriented; speech intact; face symmetrical; moves all extremities well; CNII-XII intact without focal deficit   Assessment:  1. Acute bronchitis, unspecified organism     Plan:  Rx for Z-pak, Prednisone and tessalon perles; if her employer wants a COVID test, she will need PCR test and will let us know; increase fluids, rest and follow-up worse, no better.  No follow-ups on file.  No orders of the defined types were placed in this encounter.   Requested Prescriptions   Signed Prescriptions Disp Refills  . azithromycin (ZITHROMAX) 250 MG tablet 6 tablet 0    Sig: 2 tabs po qd x 1 day; 1 tablet per day x 4 days;  . predniSONE (DELTASONE) 20 MG tablet 5 tablet 0    Sig: Take 1 tablet (20 mg total) by mouth daily with breakfast.  . benzonatate (TESSALON) 100 MG capsule 20 capsule 0    Sig: Take 1 capsule (100 mg total) by mouth 3 (three) times daily as needed.

## 2020-09-03 ENCOUNTER — Telehealth: Payer: Self-pay | Admitting: Physical Therapy

## 2020-09-03 ENCOUNTER — Encounter: Payer: BC Managed Care – PPO | Admitting: Physical Therapy

## 2020-09-03 NOTE — Telephone Encounter (Signed)
LVM for pt as she NS for PT appt.  Doesn't have any more appts scheduled so advised to call the office if she would like to reschedule.  Clarita Crane, PT, DPT 09/03/20 2:03 PM

## 2020-10-13 DIAGNOSIS — F419 Anxiety disorder, unspecified: Secondary | ICD-10-CM | POA: Diagnosis not present

## 2020-10-13 DIAGNOSIS — F902 Attention-deficit hyperactivity disorder, combined type: Secondary | ICD-10-CM | POA: Diagnosis not present

## 2020-10-13 DIAGNOSIS — Z79899 Other long term (current) drug therapy: Secondary | ICD-10-CM | POA: Diagnosis not present

## 2020-10-14 DIAGNOSIS — Z79899 Other long term (current) drug therapy: Secondary | ICD-10-CM | POA: Diagnosis not present

## 2020-10-14 DIAGNOSIS — F902 Attention-deficit hyperactivity disorder, combined type: Secondary | ICD-10-CM | POA: Diagnosis not present

## 2020-12-18 ENCOUNTER — Encounter: Payer: Self-pay | Admitting: Family Medicine

## 2020-12-18 DIAGNOSIS — M8448XS Pathological fracture, other site, sequela: Secondary | ICD-10-CM

## 2021-01-05 ENCOUNTER — Ambulatory Visit (INDEPENDENT_AMBULATORY_CARE_PROVIDER_SITE_OTHER)
Admission: RE | Admit: 2021-01-05 | Discharge: 2021-01-05 | Disposition: A | Discharge status: Home / Self Care | Payer: BC Managed Care – PPO | Source: Ambulatory Visit | Attending: Family Medicine | Admitting: Family Medicine

## 2021-01-05 ENCOUNTER — Other Ambulatory Visit: Payer: Self-pay

## 2021-01-05 DIAGNOSIS — M8448XS Pathological fracture, other site, sequela: Secondary | ICD-10-CM | POA: Diagnosis not present

## 2021-01-05 DIAGNOSIS — R7989 Other specified abnormal findings of blood chemistry: Secondary | ICD-10-CM

## 2021-01-09 DIAGNOSIS — R7989 Other specified abnormal findings of blood chemistry: Secondary | ICD-10-CM | POA: Diagnosis not present

## 2021-01-09 DIAGNOSIS — M8448XS Pathological fracture, other site, sequela: Secondary | ICD-10-CM | POA: Diagnosis not present

## 2021-01-12 DIAGNOSIS — Z79899 Other long term (current) drug therapy: Secondary | ICD-10-CM | POA: Diagnosis not present

## 2021-01-12 DIAGNOSIS — F902 Attention-deficit hyperactivity disorder, combined type: Secondary | ICD-10-CM | POA: Diagnosis not present

## 2021-01-12 NOTE — Progress Notes (Signed)
Bone density test was normal

## 2021-01-15 ENCOUNTER — Other Ambulatory Visit: Payer: Self-pay | Admitting: Internal Medicine

## 2021-01-16 ENCOUNTER — Telehealth: Payer: Self-pay | Admitting: Internal Medicine

## 2021-01-16 NOTE — Telephone Encounter (Signed)
Called patient and she decided to go to [facility].

## 2021-01-16 NOTE — Telephone Encounter (Signed)
   Patient states that sometimes after she eats she has upper stomach pains and cramps. She said that it doesn't happen all the time but is now becoming more frequent. She said that sometimes she will vomit and feels nauseous. Transferred to team health

## 2021-01-16 NOTE — Telephone Encounter (Signed)
[facility] FYI  Caller states she has been constipated for a long time. There is a pattern where her upper stomach hurts when she eats. She sometimes vomits. She has had bloody stools. There has been no blood in the vomit. She is able to urinate regularly. Her joints and bones are hurting her. She has had night sweats for over a year.  Advised to see PCP within 24 Hours  * IF OFFICE WILL BE OPEN: You need to be examined within the next 24 hours. Call your doctor (or NP/PA) when the office opens and make an appointment. STEP 1 - USE AN OSMOTIC LAXATIVE: * You can take POLYETHYLENE GLYCOL (PEG, Miralax) or MILK OF MAGNESIA. * This type of medicine helps pull water into your intestines. This softens the stools. STEP 2 - USE A STIMULANT LAXATIVE - SUPPOSITORY: * If the constipation does not get better with the Care Advice in Step 1, add a stimulant laxative. * Use either BISACODYL (Dulcolax) or a GLYCERIN SUPPOSITORY. CALL BACK IF: * You become worse CARE ADVICE given per Constipation (Adult) guideline.

## 2021-01-21 ENCOUNTER — Emergency Department (HOSPITAL_COMMUNITY)
Admission: EM | Admit: 2021-01-21 | Discharge: 2021-01-22 | Disposition: A | Discharge status: Home / Self Care | Payer: BC Managed Care – PPO | Attending: Emergency Medicine | Admitting: Emergency Medicine

## 2021-01-21 ENCOUNTER — Encounter (HOSPITAL_COMMUNITY): Payer: Self-pay

## 2021-01-21 ENCOUNTER — Ambulatory Visit (HOSPITAL_COMMUNITY)
Admission: EM | Admit: 2021-01-21 | Discharge: 2021-01-21 | Disposition: A | Discharge status: Home / Self Care | Payer: BC Managed Care – PPO

## 2021-01-21 ENCOUNTER — Other Ambulatory Visit: Payer: Self-pay

## 2021-01-21 DIAGNOSIS — K921 Melena: Secondary | ICD-10-CM

## 2021-01-21 DIAGNOSIS — Z79899 Other long term (current) drug therapy: Secondary | ICD-10-CM | POA: Diagnosis not present

## 2021-01-21 DIAGNOSIS — R1084 Generalized abdominal pain: Secondary | ICD-10-CM

## 2021-01-21 DIAGNOSIS — K51219 Ulcerative (chronic) proctitis with unspecified complications: Secondary | ICD-10-CM | POA: Insufficient documentation

## 2021-01-21 DIAGNOSIS — Z87891 Personal history of nicotine dependence: Secondary | ICD-10-CM | POA: Diagnosis not present

## 2021-01-21 DIAGNOSIS — R1030 Lower abdominal pain, unspecified: Secondary | ICD-10-CM | POA: Diagnosis not present

## 2021-01-21 DIAGNOSIS — K5229 Other allergic and dietetic gastroenteritis and colitis: Secondary | ICD-10-CM | POA: Diagnosis not present

## 2021-01-21 DIAGNOSIS — K529 Noninfective gastroenteritis and colitis, unspecified: Secondary | ICD-10-CM

## 2021-01-21 DIAGNOSIS — R1013 Epigastric pain: Secondary | ICD-10-CM | POA: Diagnosis not present

## 2021-01-21 DIAGNOSIS — I10 Essential (primary) hypertension: Secondary | ICD-10-CM | POA: Insufficient documentation

## 2021-01-21 DIAGNOSIS — R109 Unspecified abdominal pain: Secondary | ICD-10-CM | POA: Diagnosis not present

## 2021-01-21 LAB — CBC WITH DIFFERENTIAL/PLATELET
Abs Immature Granulocytes: 0.06 10*3/uL (ref 0.00–0.07)
Basophils Absolute: 0.1 10*3/uL (ref 0.0–0.1)
Basophils Relative: 0 %
Eosinophils Absolute: 0.1 10*3/uL (ref 0.0–0.5)
Eosinophils Relative: 0 %
HCT: 39.9 % (ref 36.0–46.0)
Hemoglobin: 13.1 g/dL (ref 12.0–15.0)
Immature Granulocytes: 0 %
Lymphocytes Relative: 8 %
Lymphs Abs: 1.2 10*3/uL (ref 0.7–4.0)
MCH: 30.4 pg (ref 26.0–34.0)
MCHC: 32.8 g/dL (ref 30.0–36.0)
MCV: 92.6 fL (ref 80.0–100.0)
Monocytes Absolute: 1.2 10*3/uL — ABNORMAL HIGH (ref 0.1–1.0)
Monocytes Relative: 8 %
Neutro Abs: 12 10*3/uL — ABNORMAL HIGH (ref 1.7–7.7)
Neutrophils Relative %: 84 %
Platelets: 331 10*3/uL (ref 150–400)
RBC: 4.31 MIL/uL (ref 3.87–5.11)
RDW: 13.2 % (ref 11.5–15.5)
WBC: 14.6 10*3/uL — ABNORMAL HIGH (ref 4.0–10.5)
nRBC: 0 % (ref 0.0–0.2)

## 2021-01-21 LAB — COMPREHENSIVE METABOLIC PANEL
ALT: 12 U/L (ref 0–44)
AST: 12 U/L — ABNORMAL LOW (ref 15–41)
Albumin: 4.1 g/dL (ref 3.5–5.0)
Alkaline Phosphatase: 32 U/L — ABNORMAL LOW (ref 38–126)
Anion gap: 7 (ref 5–15)
BUN: 14 mg/dL (ref 6–20)
CO2: 25 mmol/L (ref 22–32)
Calcium: 9.2 mg/dL (ref 8.9–10.3)
Chloride: 106 mmol/L (ref 98–111)
Creatinine, Ser: 0.99 mg/dL (ref 0.44–1.00)
GFR, Estimated: >60 mL/min (ref >60)
Glucose, Bld: 98 mg/dL (ref 70–99)
Potassium: 4.3 mmol/L (ref 3.5–5.1)
Sodium: 138 mmol/L (ref 135–145)
Total Bilirubin: 0.7 mg/dL (ref 0.3–1.2)
Total Protein: 6.8 g/dL (ref 6.5–8.1)

## 2021-01-21 LAB — LIPASE, BLOOD: Lipase: 26 U/L (ref 11–51)

## 2021-01-21 NOTE — ED Triage Notes (Addendum)
Pt reports having blood in stool, bloating, low back pain, abdominal cramping on and off with eating, constipation, episode of loose stool with mucus with multiple episodes of diarrhea today. Reports feeling achey after going to the bathroom.   Pt reports having bloody stool for 9 years, reports rest of symptoms began a year and a half ago. Reports weight loss and night sweats. Reports main reason for deciding to come in is that body aches have been worsening over about the past month.  Pt adds that she has intermittent chest pains, last episode yesterday. Denies chest pain at this time.

## 2021-01-21 NOTE — ED Triage Notes (Signed)
Pt reports upper abdominal pain that has been going on for a while.  Pt reports she has had blood in her stool off and on for 9 years.  What brought her in today is that she had loose stool and increased abdominal pain with night sweats.  Pt states she is usually constipated.

## 2021-01-21 NOTE — ED Provider Notes (Signed)
Emergency Medicine Provider Triage Evaluation Note  [name] , a 45 y.o. female  was evaluated in triage.  Pt complains of bloating, abdominal pain, blood in her stool for 9 years, worsening over the last week.  Denies any nausea or vomiting.  No fevers or chills.  No chills body aches.  Seen in urgent care and sent here for CT of the abdomen.  Review of Systems  Positive: As above Negative: As above  Physical Exam  BP 116/81 (BP Location: Left Arm)   Pulse 87   Temp 98.6 F (37 C) (Oral)   Resp 15   Ht 5\' 6"  (1.676 m)   Wt 64.4 kg   LMP 01/18/2021   SpO2 100%   BMI 22.92 kg/m  Gen:   Awake, no distress   Resp:  Normal effort  MSK:   Moves extremities without difficulty  Other:  generalized abdominal tenderness  Medical Decision Making  Medically screening exam initiated at 7:41 PM.  Appropriate orders placed.  Akshita Italiano was informed that the remainder of the evaluation will be completed by another provider, this initial triage assessment does not replace that evaluation, and the importance of remaining in the ED until their evaluation is complete.     [name] 01/21/21 1942    01/23/21, MD 01/30/21 [phone]

## 2021-01-21 NOTE — ED Provider Notes (Signed)
[facility]    CSN: [id number] Arrival date & time: 01/21/21  1742      History   Chief Complaint Chief Complaint  Patient presents with   Weight Loss   Night Sweats   Rectal Bleeding   Abdominal Pain   Constipation   Diarrhea   Generalized Body Aches   Chest Pain    HPI [name] is a 45 y.o. female.   HPI  Abdominal pain: Patient states that for the past 9 years she has had abdominal pain.  Pain has worsened as of this week.  She states that she is also noted some associated weight loss that is unintentional, night sweats, increased blood in the stools, flatulence with pink mucus, abdominal cramping, constipation with some breakthrough diarrhea as well as body aches.  When she discusses her blood in stool she states that the blood is within the stool and in the bowl and not just when she wipes.  She states that she has had increasing trouble with constipation over the past few years.  She has not seen a doctor about this and has never had a colonoscopy.  She also has had some vomiting but is unable to describe the texture of the vomit.  She has not tried anything for symptoms.  She denies any current chest pain, shortness of breath or dysuria.  Past Medical History:  Diagnosis Date   Depression    LBP (low back pain) 2011   Vitamin B12 deficiency 2012   Vitamin D deficiency 2012    Patient Active Problem List   Diagnosis Date Noted   Weight loss 02/27/2020   HTN (hypertension) 06/18/2019   Hyponatremia 06/18/2019   Suspected 2019 novel coronavirus infection 10/27/2018   Gastrocnemius muscle tear, left, initial encounter 01/17/2018   Bloating 10/11/2017   Sacral insufficiency fracture 02/01/2017   Shoulder pain, right 12/06/2016   Allergic rhinitis 11/24/2016   Acute sinus infection 11/24/2016   ADD (attention deficit disorder) 04/24/2015   Large breasts 10/01/2013   Scalp cyst 07/03/2013   Sacroiliac joint dysfunction of left side 04/11/2013    Nonallopathic lesion of cervical region 04/11/2013   Nonallopathic lesion of lumbosacral region 04/11/2013   Nonallopathic lesion of thoracic region 04/11/2013   Jaw dislocation 02/21/2012   Plantar fasciitis 02/09/2012   Abdominal tenderness, epigastric 07/01/2011   GERD (gastroesophageal reflux disease) 07/01/2011   Well adult exam 01/21/2011   Low back pain 12/12/2010   Paresthesia 12/12/2010   Fatigue 12/12/2010   Vitamin B 12 deficiency 12/12/2010   Vitamin D deficiency 12/12/2010   Depression 12/12/2010    Past Surgical History:  Procedure Laterality Date   CESAREAN SECTION  1-08    OB History   No obstetric history on file.      Home Medications    Prior to Admission medications   Medication Sig Start Date End Date Taking? Authorizing Provider  ADDERALL XR 25 MG 24 hr capsule  01/24/19  Yes [provider]  ADDERALL XR 30 MG 24 hr capsule  02/09/19  Yes [provider]  ALPRAZolam (XANAX) 0.5 MG tablet Take 0.5 mg by mouth at bedtime as needed for anxiety.   Yes [provider]  amLODipine-olmesartan (AZOR) 5-20 MG tablet TAKE 1 TABLET ONCE DAILY. Annual appt due in AUGUST must see provider for future refills 01/15/21  Yes Plotnikov, Georgina Quint, MD  buPROPion (WELLBUTRIN XL) 300 MG 24 hr tablet Take 300 mg by mouth daily.   Yes [provider]  cyanocobalamin (,VITAMIN B-12,) 1000 MCG/ML injection Inject 1 mL (1,000 mcg total) into the skin every 14 (fourteen) days. 06/20/19  Yes Plotnikov, Georgina Quint, MD  Levonorgestrel-Ethinyl Estradiol (AMETHIA) 0.15-0.03 &0.01 MG tablet Take 1 tablet by mouth daily.   Yes [provider]  omeprazole (PRILOSEC) 20 MG capsule Take 1 capsule (20 mg total) by mouth 2 (two) times daily before a meal. 11/02/16  Yes Nche, Bonna Gains, NP  azithromycin (ZITHROMAX) 250 MG tablet 2 tabs po qd x 1 day; 1 tablet per day x 4 days; 08/27/20   Olive Bass, FNP  benzonatate (TESSALON) 100 MG capsule  Take 1 capsule (100 mg total) by mouth 3 (three) times daily as needed. 08/27/20   Olive Bass, FNP  predniSONE (DELTASONE) 20 MG tablet Take 1 tablet (20 mg total) by mouth daily with breakfast. 08/27/20   Olive Bass, FNP    Family History Family History  Problem Relation Age of Onset   Cancer Mother    Kidney disease Other     Social History Social History   Tobacco Use   Smoking status: Former    Packs/day: 0.30    Years: 10.00    Pack years: 3.00    Types: Cigarettes   Smokeless tobacco: Never  Vaping Use   Vaping Use: Never used  Substance Use Topics   Alcohol use: Yes    Comment: occasionally/minimally   Drug use: Yes    Comment: reports occasional delta-8 use     Allergies   Strattera [atomoxetine hcl]   Review of Systems Review of Systems  As stated above in HPI Physical Exam Triage Vital Signs ED Triage Vitals  Enc Vitals Group     BP 01/21/21 1837 117/85     Pulse Rate 01/21/21 1837 76     Resp 01/21/21 1837 18     Temp 01/21/21 1837 99.2 F (37.3 C)     Temp Source 01/21/21 1837 Oral     SpO2 01/21/21 1837 100 %     Weight --      Height --      Head Circumference --      Peak Flow --      Pain Score 01/21/21 1832 2     Pain Loc --      Pain Edu? --      Excl. in GC? --    No data found.  Updated Vital Signs BP 117/85   Pulse 76   Temp 99.2 F (37.3 C) (Oral)   Resp 18   LMP 01/18/2021   SpO2 100%   Physical Exam Vitals and nursing note reviewed.  Constitutional:      General: She is not in acute distress.    Appearance: She is well-developed. She is not ill-appearing, toxic-appearing or diaphoretic.  Eyes:     Comments: No jaundice or pallor  Cardiovascular:     Rate and Rhythm: Normal rate and regular rhythm.     Heart sounds: Normal heart sounds.  Pulmonary:     Effort: Pulmonary effort is normal.     Breath sounds: Normal breath sounds.  Abdominal:     General: There is distension. There is no  abdominal bruit. There are no signs of injury.     Palpations: Abdomen is soft. There is splenomegaly and mass (Two distinct RUQ palpable masses). There is no hepatomegaly.     Tenderness: There is generalized abdominal tenderness. There is no right CVA tenderness, left CVA tenderness, guarding or  rebound. Negative signs include Murphy's sign and McBurney's sign.     Hernia: No hernia is present.  Skin:    General: Skin is warm.     Coloration: Skin is not jaundiced or pale.  Neurological:     Mental Status: She is alert.     UC Treatments / Results  Labs (all labs ordered are listed, but only abnormal results are displayed) Labs Reviewed - No data to display  EKG   Radiology No results found.  Procedures Procedures (including critical care time)  Medications Ordered in UC Medications - No data to display  Initial Impression / Assessment and Plan / UC Course  I have reviewed the triage vital signs and the nursing notes.  Pertinent labs & imaging results that were available during my care of the patient were reviewed by me and considered in my medical decision making (see chart for details).     New.  I have discussed with patient my concerns regarding her symptoms.  This will need further work-up in the emergency room with imaging to rule out any abdominal masses.  She would like for her husband to take her to the emergency room today. NPO Final Clinical Impressions(s) / UC Diagnoses   Final diagnoses:  None   Discharge Instructions   None    ED Prescriptions   None    PDMP not reviewed this encounter.   Rushie Chestnut, [place] 01/21/21 1854

## 2021-01-22 ENCOUNTER — Encounter (HOSPITAL_COMMUNITY): Payer: Self-pay

## 2021-01-22 ENCOUNTER — Emergency Department (HOSPITAL_COMMUNITY): Payer: BC Managed Care – PPO

## 2021-01-22 DIAGNOSIS — K921 Melena: Secondary | ICD-10-CM | POA: Diagnosis not present

## 2021-01-22 DIAGNOSIS — R109 Unspecified abdominal pain: Secondary | ICD-10-CM | POA: Diagnosis not present

## 2021-01-22 LAB — I-STAT BETA HCG BLOOD, ED (MC, WL, AP ONLY): I-stat hCG, quantitative: <5 m[IU]/mL (ref <5)

## 2021-01-22 MED ORDER — IOHEXOL 300 MG/ML  SOLN
100.0000 mL | Freq: Once | INTRAMUSCULAR | Status: AC | PRN
  Administered 2021-01-22: 100 mL via INTRAVENOUS

## 2021-01-22 MED ORDER — SODIUM CHLORIDE 0.9 % IV BOLUS
1000.0000 mL | Freq: Once | INTRAVENOUS | Status: AC
  Administered 2021-01-22: 1000 mL via INTRAVENOUS

## 2021-01-22 MED ORDER — FAMOTIDINE IN NACL 20-0.9 MG/50ML-% IV SOLN
20.0000 mg | Freq: Once | INTRAVENOUS | Status: AC
  Administered 2021-01-22: 20 mg via INTRAVENOUS
  Filled 2021-01-22: qty 50

## 2021-01-22 MED ORDER — SODIUM CHLORIDE (PF) 0.9 % IJ SOLN
INTRAMUSCULAR | Status: AC
  Filled 2021-01-22: qty 50

## 2021-01-22 NOTE — Discharge Instructions (Addendum)
We recommend use of a daily probiotic.  You may continue anti-inflammatories as needed for management of abdominal pain or cramping.  You would benefit from close follow-up with gastroenterology to further evaluate the cause of your symptoms.  This is favored to be inflammatory rather than infectious given how long your symptoms have been recurring.  You may follow-up with your primary care doctor in the interim.  Return for new or concerning symptoms.

## 2021-01-22 NOTE — ED Provider Notes (Signed)
[facility]EMERGENCY DEPT Provider Note   CSN: [id number] Arrival date & time: 01/21/21  1903     History Chief Complaint  Patient presents with   Abdominal Pain    Ebony StainHeather Bennett is a 45 y.o. female.  45 year old female presents to the emergency department for evaluation of abdominal pain.  She has been experiencing intermittent abdominal pain for a while.  This is present in her epigastrium.  It is associated with anorexia as well as nausea.  Being nauseated makes it difficult for the patient to maintain adequate PO intake.  She reports associated weight loss, as a result.  Notes night sweats, but these are also chronic; actually improved over the last week.  She takes omeprazole at baseline.  Has discussed her symptoms with her primary care doctor without further work-up or referrals.   The history is provided by the patient. No language interpreter was used.  Abdominal Pain     Past Medical History:  Diagnosis Date   Depression    LBP (low back pain) 2011   Vitamin B12 deficiency 2012   Vitamin D deficiency 2012    Patient Active Problem List   Diagnosis Date Noted   Weight loss 02/27/2020   HTN (hypertension) 06/18/2019   Hyponatremia 06/18/2019   Suspected 2019 novel coronavirus infection 10/27/2018   Gastrocnemius muscle tear, left, initial encounter 01/17/2018   Bloating 10/11/2017   Sacral insufficiency fracture 02/01/2017   Shoulder pain, right 12/06/2016   Allergic rhinitis 11/24/2016   Acute sinus infection 11/24/2016   ADD (attention deficit disorder) 04/24/2015   Large breasts 10/01/2013   Scalp cyst 07/03/2013   Sacroiliac joint dysfunction of left side 04/11/2013   Nonallopathic lesion of cervical region 04/11/2013   Nonallopathic lesion of lumbosacral region 04/11/2013   Nonallopathic lesion of thoracic region 04/11/2013   Jaw dislocation 02/21/2012   Plantar fasciitis 02/09/2012   Abdominal tenderness, epigastric 07/01/2011    GERD (gastroesophageal reflux disease) 07/01/2011   Well adult exam 01/21/2011   Low back pain 12/12/2010   Paresthesia 12/12/2010   Fatigue 12/12/2010   Vitamin B 12 deficiency 12/12/2010   Vitamin D deficiency 12/12/2010   Depression 12/12/2010    Past Surgical History:  Procedure Laterality Date   CESAREAN SECTION  1-08     OB History   No obstetric history on file.     Family History  Problem Relation Age of Onset   Cancer Mother    Kidney disease Other     Social History   Tobacco Use   Smoking status: Former    Packs/day: 0.30    Years: 10.00    Pack years: 3.00    Types: Cigarettes   Smokeless tobacco: Never  Vaping Use   Vaping Use: Never used  Substance Use Topics   Alcohol use: Yes    Comment: occasionally/minimally   Drug use: Yes    Comment: reports occasional delta-8 use    Home Medications Prior to Admission medications   Medication Sig Start Date End Date Taking? Authorizing Provider  ADDERALL XR 25 MG 24 hr capsule Take 25 mg by mouth daily at 12 noon. 01/24/19  Yes [provider]  ADDERALL XR 30 MG 24 hr capsule Take 30 mg by mouth in the morning. 02/09/19  Yes [provider]  ALPRAZolam Prudy Feeler(XANAX) 0.5 MG tablet Take 0.5 mg by mouth at bedtime as needed for anxiety.   Yes [provider]  amLODipine-olmesartan (AZOR) 5-20 MG tablet TAKE 1 TABLET ONCE  DAILY. Annual appt due in AUGUST must see provider for future refills 01/15/21  Yes Plotnikov, Georgina Quint, MD  buPROPion (WELLBUTRIN XL) 300 MG 24 hr tablet Take 300 mg by mouth daily.   Yes [provider]  cyanocobalamin (,VITAMIN B-12,) 1000 MCG/ML injection Inject 1 mL (1,000 mcg total) into the skin every 14 (fourteen) days. Patient taking differently: Inject 1,000 mcg into the skin as needed (energy). 06/20/19  Yes Plotnikov, Georgina Quint, MD  Levonorgestrel-Ethinyl Estradiol (AMETHIA) 0.15-0.03 &0.01 MG tablet Take 1 tablet by mouth daily. Camrese   Yes [provider]  omeprazole (PRILOSEC) 20 MG capsule Take 1 capsule (20 mg total) by mouth 2 (two) times daily before a meal. Patient taking differently: Take 20 mg by mouth daily. 11/02/16  Yes Nche, Bonna Gains, NP  ondansetron (ZOFRAN-ODT) 8 MG disintegrating tablet Take 8 mg by mouth every 8 (eight) hours as needed. 12/06/20  Yes [provider]    Allergies    Strattera [atomoxetine hcl]  Review of Systems   Review of Systems  Gastrointestinal:  Positive for abdominal pain.  Ten systems reviewed and are negative for acute change, except as noted in the HPI.    Physical Exam Updated Vital Signs BP 116/70   Pulse 78   Temp 98.6 F (37 C) (Oral)   Resp 17   Ht 5\' 6"  (1.676 m)   Wt 64.4 kg   LMP 01/18/2021 (Approximate) Comment: negative beta HCG 01/22/21  SpO2 99%   BMI 22.92 kg/m   Physical Exam Vitals and nursing note reviewed.  Constitutional:      General: She is not in acute distress.    Appearance: She is well-developed. She is not diaphoretic.     Comments: Nontoxic appearing and in NAD  HENT:     Head: Normocephalic and atraumatic.  Eyes:     General: No scleral icterus.    Conjunctiva/sclera: Conjunctivae normal.  Cardiovascular:     Rate and Rhythm: Normal rate and regular rhythm.     Pulses: Normal pulses.  Pulmonary:     Effort: Pulmonary effort is normal. No respiratory distress.  Abdominal:     Comments: Soft, nondistended abdomen.  There is minimal epigastric tenderness to palpation.  Negative Murphy sign.  No peritoneal signs, guarding.  Musculoskeletal:        General: Normal range of motion.     Cervical back: Normal range of motion.  Skin:    General: Skin is warm and dry.     Coloration: Skin is not pale.     Findings: No erythema or rash.  Neurological:     Mental Status: She is alert and oriented to person, place, and time.     Coordination: Coordination normal.  Psychiatric:        Behavior: Behavior normal.    ED Results /  Procedures / Treatments   Labs (all labs ordered are listed, but only abnormal results are displayed) Labs Reviewed  CBC WITH DIFFERENTIAL/PLATELET - Abnormal; Notable for the following components:      Result Value   WBC 14.6 (*)    Neutro Abs 12.0 (*)    Monocytes Absolute 1.2 (*)    All other components within normal limits  COMPREHENSIVE METABOLIC PANEL - Abnormal; Notable for the following components:   AST 12 (*)    Alkaline Phosphatase 32 (*)    All other components within normal limits  LIPASE, BLOOD  I-STAT BETA HCG BLOOD, ED ([facility], [facility], [facility] ONLY)  EKG None  Radiology CT ABDOMEN PELVIS W CONTRAST  Result Date: 01/22/2021 CLINICAL DATA:  Leukocytosis, upper abdominal pain, episodic hematochezia EXAM: CT ABDOMEN AND PELVIS WITH CONTRAST TECHNIQUE: Multidetector CT imaging of the abdomen and pelvis was performed using the standard protocol following bolus administration of intravenous contrast. CONTRAST:  OMNIPAQUE IOHEXOL 300 MG/ML  SOLN COMPARISON:  None. FINDINGS: Lower chest: The visualized lung bases are clear. The visualized heart and pericardium are unremarkable. Hepatobiliary: No focal liver abnormality is seen. No gallstones, gallbladder wall thickening, or biliary dilatation. Pancreas: Unremarkable Spleen: Unremarkable Adrenals/Urinary Tract: Adrenal glands are unremarkable. Kidneys are normal, without renal calculi, focal lesion, or hydronephrosis. Bladder is unremarkable. Stomach/Bowel: There is mild circumferential bowel wall thickening and mucosal hyperemia involving the distal descending and sigmoid colon as well as the rectum in keeping with changes of a mild proctocolitis, possibly infectious or inflammatory as can be seen with pseudomembranous colitis or ulcerative colitis. There is moderate stool within the more proximal colon without evidence of obstruction. The stomach, small bowel, and appendix are unremarkable. No free intraperitoneal gas or fluid.  Vascular/Lymphatic: No significant vascular findings are present. No enlarged abdominal or pelvic lymph nodes. Reproductive: Uterus and bilateral adnexa are unremarkable. Other: No abdominal wall hernia. Musculoskeletal: No lytic or blastic bone lesions. No acute bone abnormality. IMPRESSION: Mild proctocolitis, possibly infectious or inflammatory in nature. No evidence of perforation. Moderate stool upstream from the inflamed section of colon without CT evidence of large bowel obstruction. Electronically Signed   By: Helyn Numbers MD   On: 01/22/2021 03:47    Procedures Procedures   Medications Ordered in ED Medications  sodium chloride (PF) 0.9 % injection (has no administration in time range)  famotidine (PEPCID) IVPB 20 mg premix (20 mg Intravenous New Bag/Given 01/22/21 0258)  sodium chloride 0.9 % bolus 1,000 mL (1,000 mLs Intravenous New Bag/Given 01/22/21 0258)  iohexol (OMNIPAQUE) 300 MG/ML solution 100 mL (100 mLs Intravenous Contrast Given 01/22/21 0328)    ED Course  I have reviewed the triage vital signs and the nursing notes.  Pertinent labs & imaging results that were available during my care of the patient were reviewed by me and considered in my medical decision making (see chart for details).    MDM Rules/Calculators/A&P                          45 year old female presents to the emergency department for further evaluation of abdominal pain.  She has actually been experiencing abdominal pain intermittently for a number of years.  Symptoms associated with sporadic episodes of hematochezia.  Went to urgent care today and was advised to come to the ED for further assessment.  Patient with fairly benign abdominal exam today.  She appears well, nontoxic.  Labs with nonspecific leukocytosis, but no other changes to vital signs to suggest SIRS/sepsis.  Her CT today shows findings suggestive of proctocolitis.  This may be infectious versus inflammatory.  Given symptom chronicity,  inflammatory etiology is favored.  Will refer to gastroenterology as patient would benefit from further work-up, possible colonoscopy.  She has been instructed to continue NSAIDs for pain.  Encouraged initiation of a daily probiotic.  Return precautions discussed and provided. Patient discharged in stable condition with no unaddressed concerns.   Final Clinical Impression(s) / ED Diagnoses Final diagnoses:  Proctocolitis    Rx / DC Orders ED Discharge Orders     None  Antony Madura, PA-C 01/22/21 2023    Sabas Sous, MD 01/22/21 [phone]

## 2021-01-28 NOTE — Telephone Encounter (Signed)
  Patient seeking advice for constipation and nausea Please call [phone]

## 2021-02-11 DIAGNOSIS — K625 Hemorrhage of anus and rectum: Secondary | ICD-10-CM | POA: Diagnosis not present

## 2021-02-11 DIAGNOSIS — K5904 Chronic idiopathic constipation: Secondary | ICD-10-CM | POA: Diagnosis not present

## 2021-02-11 DIAGNOSIS — R1013 Epigastric pain: Secondary | ICD-10-CM | POA: Diagnosis not present

## 2021-02-12 DIAGNOSIS — K625 Hemorrhage of anus and rectum: Secondary | ICD-10-CM | POA: Diagnosis not present

## 2021-02-12 DIAGNOSIS — R1013 Epigastric pain: Secondary | ICD-10-CM | POA: Diagnosis not present

## 2021-04-07 ENCOUNTER — Other Ambulatory Visit: Payer: Self-pay | Admitting: Internal Medicine

## 2021-04-16 ENCOUNTER — Other Ambulatory Visit: Payer: Self-pay | Admitting: Internal Medicine

## 2021-05-05 DIAGNOSIS — Z79899 Other long term (current) drug therapy: Secondary | ICD-10-CM | POA: Diagnosis not present

## 2021-05-05 DIAGNOSIS — F902 Attention-deficit hyperactivity disorder, combined type: Secondary | ICD-10-CM | POA: Diagnosis not present

## 2021-05-11 ENCOUNTER — Ambulatory Visit (INDEPENDENT_AMBULATORY_CARE_PROVIDER_SITE_OTHER): Payer: BC Managed Care – PPO | Admitting: Internal Medicine

## 2021-05-11 ENCOUNTER — Other Ambulatory Visit: Payer: Self-pay

## 2021-05-11 ENCOUNTER — Encounter: Payer: Self-pay | Admitting: Internal Medicine

## 2021-05-11 VITALS — BP 110/76 | HR 92 | Temp 98.4°F | Ht 66.0 in | Wt 141.2 lb

## 2021-05-11 DIAGNOSIS — Z0000 Encounter for general adult medical examination without abnormal findings: Secondary | ICD-10-CM

## 2021-05-11 DIAGNOSIS — Z1211 Encounter for screening for malignant neoplasm of colon: Secondary | ICD-10-CM | POA: Diagnosis not present

## 2021-05-11 DIAGNOSIS — E559 Vitamin D deficiency, unspecified: Secondary | ICD-10-CM

## 2021-05-11 DIAGNOSIS — E538 Deficiency of other specified B group vitamins: Secondary | ICD-10-CM | POA: Diagnosis not present

## 2021-05-11 LAB — TSH: TSH: 3.43 u[IU]/mL (ref 0.35–5.50)

## 2021-05-11 LAB — COMPREHENSIVE METABOLIC PANEL
ALT: 9 U/L (ref 0–35)
AST: 10 U/L (ref 0–37)
Albumin: 4.2 g/dL (ref 3.5–5.2)
Alkaline Phosphatase: 33 U/L — ABNORMAL LOW (ref 39–117)
BUN: 14 mg/dL (ref 6–23)
CO2: 26 mEq/L (ref 19–32)
Calcium: 9 mg/dL (ref 8.4–10.5)
Chloride: 105 mEq/L (ref 96–112)
Creatinine, Ser: 1.06 mg/dL (ref 0.40–1.20)
GFR: 63.77 mL/min (ref >60.00)
Glucose, Bld: 91 mg/dL (ref 70–99)
Potassium: 4.2 mEq/L (ref 3.5–5.1)
Sodium: 137 mEq/L (ref 135–145)
Total Bilirubin: 0.3 mg/dL (ref 0.2–1.2)
Total Protein: 6.5 g/dL (ref 6.0–8.3)

## 2021-05-11 LAB — CBC WITH DIFFERENTIAL/PLATELET
Basophils Absolute: 0.1 10*3/uL (ref 0.0–0.1)
Basophils Relative: 0.6 % (ref 0.0–3.0)
Eosinophils Absolute: 0 10*3/uL (ref 0.0–0.7)
Eosinophils Relative: 0.4 % (ref 0.0–5.0)
HCT: 37.4 % (ref 36.0–46.0)
Hemoglobin: 12.5 g/dL (ref 12.0–15.0)
Lymphocytes Relative: 13.7 % (ref 12.0–46.0)
Lymphs Abs: 1.3 10*3/uL (ref 0.7–4.0)
MCHC: 33.5 g/dL (ref 30.0–36.0)
MCV: 90.1 fl (ref 78.0–100.0)
Monocytes Absolute: 0.7 10*3/uL (ref 0.1–1.0)
Monocytes Relative: 7.2 % (ref 3.0–12.0)
Neutro Abs: 7.3 10*3/uL (ref 1.4–7.7)
Neutrophils Relative %: 78.1 % — ABNORMAL HIGH (ref 43.0–77.0)
Platelets: 347 10*3/uL (ref 150.0–400.0)
RBC: 4.15 Mil/uL (ref 3.87–5.11)
RDW: 13.1 % (ref 11.5–15.5)
WBC: 9.4 10*3/uL (ref 4.0–10.5)

## 2021-05-11 LAB — URINALYSIS
Bilirubin Urine: NEGATIVE
Hgb urine dipstick: NEGATIVE
Ketones, ur: NEGATIVE
Leukocytes,Ua: NEGATIVE
Nitrite: NEGATIVE
Specific Gravity, Urine: >=1.03 — AB (ref 1.000–1.030)
Total Protein, Urine: NEGATIVE
Urine Glucose: NEGATIVE
Urobilinogen, UA: 0.2 (ref 0.0–1.0)
pH: 6 (ref 5.0–8.0)

## 2021-05-11 LAB — LIPID PANEL
Cholesterol: 189 mg/dL (ref 0–200)
HDL: 45.4 mg/dL (ref >39.00)
LDL Cholesterol: 125 mg/dL — ABNORMAL HIGH (ref 0–99)
NonHDL: 143.2
Total CHOL/HDL Ratio: 4
Triglycerides: 92 mg/dL (ref 0.0–149.0)
VLDL: 18.4 mg/dL (ref 0.0–40.0)

## 2021-05-11 LAB — VITAMIN D 25 HYDROXY (VIT D DEFICIENCY, FRACTURES): VITD: 34.25 ng/mL (ref 30.00–100.00)

## 2021-05-11 LAB — VITAMIN B12: Vitamin B-12: 245 pg/mL (ref 211–911)

## 2021-05-11 MED ORDER — CYANOCOBALAMIN 1000 MCG/ML IJ SOLN: 1000.0000 ug | INTRAMUSCULAR | 6 refills | Status: DC

## 2021-05-11 NOTE — Assessment & Plan Note (Signed)
On Vit B12 inj Check Vit B12

## 2021-05-11 NOTE — Progress Notes (Signed)
Subjective:  Patient ID: [name], female    DOB: [birth year]  Age: 45 y.o. MRN: [id number]  CC: Annual Exam   HPI [name] presents for a well exam. She is a pescaterian. F/u Vit B12 and Vit D def  Outpatient Medications Prior to Visit  Medication Sig Dispense Refill   ADDERALL XR 25 MG 24 hr capsule Take 25 mg by mouth daily at 12 noon.     ADDERALL XR 30 MG 24 hr capsule Take 30 mg by mouth in the morning.     ALPRAZolam (XANAX) 0.5 MG tablet Take 0.5 mg by mouth at bedtime as needed for anxiety.     amLODipine-olmesartan (AZOR) 5-20 MG tablet TAKE 1 TABLET ONCE DAILY. Must keep Oct appt for future refills 30 tablet 0   buPROPion (WELLBUTRIN XL) 300 MG 24 hr tablet Take 300 mg by mouth daily.     Levonorgestrel-Ethinyl Estradiol (AMETHIA) 0.15-0.03 &0.01 MG tablet Take 1 tablet by mouth daily. Camrese     omeprazole (PRILOSEC) 20 MG capsule Take 1 capsule (20 mg total) by mouth 2 (two) times daily before a meal. (Patient taking differently: Take 20 mg by mouth daily.) 60 capsule 3   cyanocobalamin (,VITAMIN B-12,) 1000 MCG/ML injection Inject 1 mL (1,000 mcg total) into the skin every 14 (fourteen) days. (Patient not taking: Reported on 05/11/2021) 10 mL 6   ondansetron (ZOFRAN-ODT) 8 MG disintegrating tablet Take 8 mg by mouth every 8 (eight) hours as needed. (Patient not taking: Reported on 05/11/2021)     No facility-administered medications prior to visit.    ROS: Review of Systems  Constitutional:  Negative for activity change, appetite change, chills, fatigue and unexpected weight change.  HENT:  Negative for congestion, mouth sores and sinus pressure.   Eyes:  Negative for visual disturbance.  Respiratory:  Negative for cough and chest tightness.   Gastrointestinal:  Negative for abdominal pain and nausea.  Genitourinary:  Negative for difficulty urinating, frequency and vaginal pain.  Musculoskeletal:  Negative for back pain and gait problem.  Skin:  Negative  for pallor and rash.  Neurological:  Negative for dizziness, tremors, weakness, numbness and headaches.  Psychiatric/Behavioral:  Negative for confusion and sleep disturbance.    Objective:  BP 110/76 (BP Location: Left Arm)   Pulse 92   Temp 98.4 F (36.9 C) (Oral)   Ht 5\' 6"  (1.676 m)   Wt 141 lb 3.2 oz (64 kg)   SpO2 98%   BMI 22.79 kg/m   BP Readings from Last 3 Encounters:  05/11/21 110/76  01/22/21 109/84  01/21/21 117/85    Wt Readings from Last 3 Encounters:  05/11/21 141 lb 3.2 oz (64 kg)  01/21/21 142 lb (64.4 kg)  07/28/20 139 lb 9.6 oz (63.3 kg)    Physical Exam Constitutional:      General: She is not in acute distress.    Appearance: She is well-developed.  HENT:     Head: Normocephalic.     Right Ear: External ear normal.     Left Ear: External ear normal.     Nose: Nose normal.  Eyes:     General:        Right eye: No discharge.        Left eye: No discharge.     Conjunctiva/sclera: Conjunctivae normal.     Pupils: Pupils are equal, round, and reactive to light.  Neck:     Thyroid: No thyromegaly.     Vascular: No JVD.  Trachea: No tracheal deviation.  Cardiovascular:     Rate and Rhythm: Normal rate and regular rhythm.     Heart sounds: Normal heart sounds.  Pulmonary:     Effort: No respiratory distress.     Breath sounds: No stridor. No wheezing.  Abdominal:     General: Bowel sounds are normal. There is no distension.     Palpations: Abdomen is soft. There is no mass.     Tenderness: There is no abdominal tenderness. There is no guarding or rebound.  Musculoskeletal:        General: No tenderness.     Cervical back: Normal range of motion and neck supple. No rigidity.  Lymphadenopathy:     Cervical: No cervical adenopathy.  Skin:    Findings: No erythema or rash.  Neurological:     Mental Status: She is oriented to person, place, and time.     Cranial Nerves: No cranial nerve deficit.     Motor: No abnormal muscle tone.      Coordination: Coordination normal.     Deep Tendon Reflexes: Reflexes normal.  Psychiatric:        Behavior: Behavior normal.        Thought Content: Thought content normal.        Judgment: Judgment normal.    Lab Results  Component Value Date   WBC 14.6 (H) 01/21/2021   HGB 13.1 01/21/2021   HCT 39.9 01/21/2021   PLT 331 01/21/2021   GLUCOSE 98 01/21/2021   CHOL 167 02/21/2019   TRIG 114.0 02/21/2019   HDL 55.80 02/21/2019   LDLCALC 89 02/21/2019   ALT 12 01/21/2021   AST 12 (L) 01/21/2021   NA 138 01/21/2021   K 4.3 01/21/2021   CL 106 01/21/2021   CREATININE 0.99 01/21/2021   BUN 14 01/21/2021   CO2 25 01/21/2021   TSH 5.19 (H) 02/27/2020    CT ABDOMEN PELVIS W CONTRAST  Result Date: 01/22/2021 CLINICAL DATA:  Leukocytosis, upper abdominal pain, episodic hematochezia EXAM: CT ABDOMEN AND PELVIS WITH CONTRAST TECHNIQUE: Multidetector CT imaging of the abdomen and pelvis was performed using the standard protocol following bolus administration of intravenous contrast. CONTRAST:  OMNIPAQUE IOHEXOL 300 MG/ML  SOLN COMPARISON:  None. FINDINGS: Lower chest: The visualized lung bases are clear. The visualized heart and pericardium are unremarkable. Hepatobiliary: No focal liver abnormality is seen. No gallstones, gallbladder wall thickening, or biliary dilatation. Pancreas: Unremarkable Spleen: Unremarkable Adrenals/Urinary Tract: Adrenal glands are unremarkable. Kidneys are normal, without renal calculi, focal lesion, or hydronephrosis. Bladder is unremarkable. Stomach/Bowel: There is mild circumferential bowel wall thickening and mucosal hyperemia involving the distal descending and sigmoid colon as well as the rectum in keeping with changes of a mild proctocolitis, possibly infectious or inflammatory as can be seen with pseudomembranous colitis or ulcerative colitis. There is moderate stool within the more proximal colon without evidence of obstruction. The stomach, small bowel,  and appendix are unremarkable. No free intraperitoneal gas or fluid. Vascular/Lymphatic: No significant vascular findings are present. No enlarged abdominal or pelvic lymph nodes. Reproductive: Uterus and bilateral adnexa are unremarkable. Other: No abdominal wall hernia. Musculoskeletal: No lytic or blastic bone lesions. No acute bone abnormality. IMPRESSION: Mild proctocolitis, possibly infectious or inflammatory in nature. No evidence of perforation. Moderate stool upstream from the inflamed section of colon without CT evidence of large bowel obstruction. Electronically Signed   By: Helyn Numbers MD   On: 01/22/2021 03:47    Assessment & Plan:  Problem List Items Addressed This Visit     Vitamin B 12 deficiency    On Vit B12 inj Check Vit B12      Relevant Orders   Vitamin B12   Vitamin D deficiency    On Vit D Check Vit D      Relevant Orders   VITAMIN D 25 Hydroxy (Vit-D Deficiency, Fractures)   Well adult exam - Primary     We discussed age appropriate health related issues, including available/recomended screening tests and vaccinations. Labs were ordered to be later reviewed . All questions were answered. We discussed one or more of the following - seat belt use, use of sunscreen/sun exposure exercise, fall risk reduction, second hand smoke exposure, firearm use and storage, seat belt use, a need for adhering to healthy diet and exercise. Labs were ordered.  All questions were answered. Colonoscopy - Dr Ewing Schlein - pending        Relevant Orders   TSH   CBC with Differential/Platelet   Lipid panel   Comprehensive metabolic panel   Urinalysis   Vitamin B12   VITAMIN D 25 Hydroxy (Vit-D Deficiency, Fractures)   Other Visit Diagnoses     Colon cancer screening       Relevant Orders   Ambulatory referral to Gastroenterology         Follow-up: Return in about 6 months (around 11/09/2021) for a follow-up visit.  Sonda Primes, MD

## 2021-05-11 NOTE — Assessment & Plan Note (Signed)
On Vit D Check Vit D 

## 2021-05-11 NOTE — Assessment & Plan Note (Addendum)
  We discussed age appropriate health related issues, including available/recomended screening tests and vaccinations. Labs were ordered to be later reviewed . All questions were answered. We discussed one or more of the following - seat belt use, use of sunscreen/sun exposure exercise, fall risk reduction, second hand smoke exposure, firearm use and storage, seat belt use, a need for adhering to healthy diet and exercise. Labs were ordered.  All questions were answered. Colonoscopy - Dr Ewing Schlein due in 2023

## 2021-05-15 ENCOUNTER — Other Ambulatory Visit: Payer: Self-pay | Admitting: Internal Medicine

## 2021-06-29 DIAGNOSIS — Z01419 Encounter for gynecological examination (general) (routine) without abnormal findings: Secondary | ICD-10-CM | POA: Diagnosis not present

## 2021-06-29 DIAGNOSIS — Z1231 Encounter for screening mammogram for malignant neoplasm of breast: Secondary | ICD-10-CM | POA: Diagnosis not present

## 2021-06-29 DIAGNOSIS — Z6823 Body mass index (BMI) 23.0-23.9, adult: Secondary | ICD-10-CM | POA: Diagnosis not present

## 2021-06-30 DIAGNOSIS — Z01419 Encounter for gynecological examination (general) (routine) without abnormal findings: Secondary | ICD-10-CM | POA: Diagnosis not present

## 2021-10-16 DIAGNOSIS — F902 Attention-deficit hyperactivity disorder, combined type: Secondary | ICD-10-CM | POA: Diagnosis not present

## 2021-10-16 DIAGNOSIS — Z79899 Other long term (current) drug therapy: Secondary | ICD-10-CM | POA: Diagnosis not present

## 2021-10-19 DIAGNOSIS — F902 Attention-deficit hyperactivity disorder, combined type: Secondary | ICD-10-CM | POA: Diagnosis not present

## 2021-11-16 DIAGNOSIS — H5213 Myopia, bilateral: Secondary | ICD-10-CM | POA: Diagnosis not present

## 2021-11-16 DIAGNOSIS — H524 Presbyopia: Secondary | ICD-10-CM | POA: Diagnosis not present

## 2022-01-14 DIAGNOSIS — F902 Attention-deficit hyperactivity disorder, combined type: Secondary | ICD-10-CM | POA: Diagnosis not present

## 2022-01-14 DIAGNOSIS — Z79899 Other long term (current) drug therapy: Secondary | ICD-10-CM | POA: Diagnosis not present

## 2022-03-17 ENCOUNTER — Encounter: Payer: Self-pay | Admitting: Family Medicine

## 2022-03-17 ENCOUNTER — Ambulatory Visit (INDEPENDENT_AMBULATORY_CARE_PROVIDER_SITE_OTHER): Payer: BC Managed Care – PPO | Admitting: Family Medicine

## 2022-03-17 VITALS — BP 110/62 | HR 80 | Temp 97.6°F | Ht 66.0 in | Wt 144.0 lb

## 2022-03-17 DIAGNOSIS — R52 Pain, unspecified: Secondary | ICD-10-CM

## 2022-03-17 DIAGNOSIS — R5383 Other fatigue: Secondary | ICD-10-CM

## 2022-03-17 DIAGNOSIS — R350 Frequency of micturition: Secondary | ICD-10-CM

## 2022-03-17 DIAGNOSIS — R319 Hematuria, unspecified: Secondary | ICD-10-CM | POA: Diagnosis not present

## 2022-03-17 DIAGNOSIS — N3001 Acute cystitis with hematuria: Secondary | ICD-10-CM | POA: Diagnosis not present

## 2022-03-17 LAB — COMPREHENSIVE METABOLIC PANEL
ALT: 8 U/L (ref 0–35)
AST: 10 U/L (ref 0–37)
Albumin: 4.2 g/dL (ref 3.5–5.2)
Alkaline Phosphatase: 33 U/L — ABNORMAL LOW (ref 39–117)
BUN: 24 mg/dL — ABNORMAL HIGH (ref 6–23)
CO2: 25 mEq/L (ref 19–32)
Calcium: 9.1 mg/dL (ref 8.4–10.5)
Chloride: 105 mEq/L (ref 96–112)
Creatinine, Ser: 1.04 mg/dL (ref 0.40–1.20)
GFR: 64.85 mL/min (ref >60.00)
Glucose, Bld: 94 mg/dL (ref 70–99)
Potassium: 4 mEq/L (ref 3.5–5.1)
Sodium: 135 mEq/L (ref 135–145)
Total Bilirubin: 0.3 mg/dL (ref 0.2–1.2)
Total Protein: 6.6 g/dL (ref 6.0–8.3)

## 2022-03-17 LAB — POCT URINALYSIS DIPSTICK
Bilirubin, UA: POSITIVE
Blood, UA: POSITIVE
Glucose, UA: NEGATIVE
Ketones, UA: NEGATIVE
Leukocytes, UA: NEGATIVE
Nitrite, UA: NEGATIVE
Protein, UA: POSITIVE — AB
Spec Grav, UA: >=1.03 — AB (ref 1.010–1.025)
Urobilinogen, UA: 1 E.U./dL
pH, UA: 6 (ref 5.0–8.0)

## 2022-03-17 LAB — CBC WITH DIFFERENTIAL/PLATELET
Basophils Absolute: 0 10*3/uL (ref 0.0–0.1)
Basophils Relative: 0.8 % (ref 0.0–3.0)
Eosinophils Absolute: 0.1 10*3/uL (ref 0.0–0.7)
Eosinophils Relative: 1.6 % (ref 0.0–5.0)
HCT: 37.6 % (ref 36.0–46.0)
Hemoglobin: 12.4 g/dL (ref 12.0–15.0)
Lymphocytes Relative: 24.3 % (ref 12.0–46.0)
Lymphs Abs: 1.4 10*3/uL (ref 0.7–4.0)
MCHC: 33 g/dL (ref 30.0–36.0)
MCV: 86.9 fl (ref 78.0–100.0)
Monocytes Absolute: 0.4 10*3/uL (ref 0.1–1.0)
Monocytes Relative: 7.7 % (ref 3.0–12.0)
Neutro Abs: 3.8 10*3/uL (ref 1.4–7.7)
Neutrophils Relative %: 65.6 % (ref 43.0–77.0)
Platelets: 342 10*3/uL (ref 150.0–400.0)
RBC: 4.33 Mil/uL (ref 3.87–5.11)
RDW: 14.6 % (ref 11.5–15.5)
WBC: 5.8 10*3/uL (ref 4.0–10.5)

## 2022-03-17 LAB — TSH: TSH: 2.88 u[IU]/mL (ref 0.35–5.50)

## 2022-03-17 LAB — POC COVID19 BINAXNOW: SARS Coronavirus 2 Ag: NEGATIVE

## 2022-03-17 NOTE — Patient Instructions (Signed)
Take amoxicillin as discussed.   We will be in touch with your labs and urine culture.   Let us know if you have any new or worsening symptoms.

## 2022-03-17 NOTE — Progress Notes (Signed)
Subjective: Chief Complaint  Patient presents with   Hematuria    Blood in urine first noticed yesterday morning. Denies dysuria.      [name] is a 46 y.o. female who complains of possible urinary tract infection.  She has had symptoms for 6 days.  Symptoms include  chills, fatigue, body aches, urinary frequency, hematuria, dysuria, and nausea . Patient denies fever, abdominal pain, back pain, vomiting or diarrhea. Reports chronic constipation.  Last UTI was never.   States she has taken 2 doses of Amoxicillin 500 mg for current symptoms.    No hx of renal stones or UTI   LMP: 3 month  Husband vasectomy   Patient does not have a history of recurrent UTI. Patient does not have a history of pyelonephritis.  No other aggravating or relieving factors.  No other c/o.  Past Medical History:  Diagnosis Date   Depression    LBP (low back pain) 2011   Vitamin B12 deficiency 2012   Vitamin D deficiency 2012    ROS as in subjective  Reviewed allergies, medications, past medical, surgical, and social history.    Objective: Vitals:   03/17/22 1544  BP: 110/62  Bennett: 80  Temp: 97.6 F (36.4 C)  SpO2: 98%    General appearance: alert, no distress, WD/WN, female Cardiac: RRR Lungs: CTA Abdomen: +bs, soft, non tender, non distended, no masses Back: no CVA tenderness GU: deferred      Laboratory:  Urine dipstick: sp gravity 1.030, 2+ for hemoglobin, negative for leukocyte esterase, negative for nitrites, and trace for protein.       Assessment: Acute cystitis with hematuria - Plan: Urine Culture, Urine Culture  Hematuria, unspecified type - Plan: POCT urinalysis dipstick, Urine Culture, Urine Culture  Urinary frequency - Plan: Urine Culture, Urine Culture  Body aches - Plan: POC COVID-19  Fatigue, unspecified type - Plan: CBC with Differential/Platelet, Comprehensive metabolic panel, TSH, POC COVID-19, TSH, Comprehensive metabolic panel, CBC with  Differential/Platelet   Plan: Covid test negative.   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  Advised increased water intake, can use OTC Tylenol for pain.    Advised if she develops any new or worsening symptoms to follow up.   Urine culture sent.  Discussed ordering a CT renal stone study and she prefers to hold off until urine culture is back and to see if she has any worsening symptoms.   Call or return if worse or not improving.

## 2022-03-18 ENCOUNTER — Encounter: Payer: Self-pay | Admitting: Family Medicine

## 2022-03-18 LAB — URINE CULTURE

## 2022-03-19 NOTE — Telephone Encounter (Signed)
Will let pt know per lab result comment it did not show a UTI and she can stop the antibiotic. Pt is wondering if it could have been a kidney stone?

## 2022-04-12 DIAGNOSIS — Z79899 Other long term (current) drug therapy: Secondary | ICD-10-CM | POA: Diagnosis not present

## 2022-04-12 DIAGNOSIS — F902 Attention-deficit hyperactivity disorder, combined type: Secondary | ICD-10-CM | POA: Diagnosis not present

## 2022-04-16 ENCOUNTER — Other Ambulatory Visit: Payer: Self-pay | Admitting: Internal Medicine

## 2022-04-16 DIAGNOSIS — Z79899 Other long term (current) drug therapy: Secondary | ICD-10-CM | POA: Diagnosis not present

## 2022-04-16 DIAGNOSIS — F902 Attention-deficit hyperactivity disorder, combined type: Secondary | ICD-10-CM | POA: Diagnosis not present

## 2022-06-15 NOTE — Progress Notes (Unsigned)
    Subjective:    Patient ID: [name], female    DOB: [birth year], 46 y.o.   MRN: [id number]      HPI [name] is here for No chief complaint on file.   Gross hematuria -      Medications and allergies reviewed with patient and updated if appropriate.  Current Outpatient Medications on File Prior to Visit  Medication Sig Dispense Refill   ADDERALL XR 25 MG 24 hr capsule Take 25 mg by mouth daily at 12 noon.     ADDERALL XR 30 MG 24 hr capsule Take 30 mg by mouth in the morning.     ALPRAZolam (XANAX) 0.5 MG tablet Take 0.5 mg by mouth at bedtime as needed for anxiety.     amLODipine-olmesartan (AZOR) 5-20 MG tablet TAKE ONE TABLET BY MOUTH ONCE DAILY Annual appt due in Oct must see provider for future refills 90 tablet 0   buPROPion (WELLBUTRIN XL) 300 MG 24 hr tablet Take 300 mg by mouth daily.     cyanocobalamin (,VITAMIN B-12,) 1000 MCG/ML injection Inject 1 mL (1,000 mcg total) into the skin every 14 (fourteen) days. 10 mL 6   Levonorgestrel-Ethinyl Estradiol (AMETHIA) 0.15-0.03 &0.01 MG tablet Take 1 tablet by mouth daily. Camrese     pantoprazole (PROTONIX) 40 MG tablet TAKE ONE tablet Orally twice a DAY 30 minutes BEFORE eating     No current facility-administered medications on file prior to visit.    Review of Systems     Objective:  There were no vitals filed for this visit. BP Readings from Last 3 Encounters:  03/17/22 110/62  05/11/21 110/76  01/22/21 109/84   Wt Readings from Last 3 Encounters:  03/17/22 144 lb (65.3 kg)  05/11/21 141 lb 3.2 oz (64 kg)  01/21/21 142 lb (64.4 kg)   There is no height or weight on file to calculate BMI.    Physical Exam         Assessment & Plan:    See Problem List for Assessment and Plan of chronic medical problems.

## 2022-06-16 ENCOUNTER — Encounter: Payer: Self-pay | Admitting: Internal Medicine

## 2022-06-16 ENCOUNTER — Ambulatory Visit: Payer: BC Managed Care – PPO | Admitting: Internal Medicine

## 2022-06-16 ENCOUNTER — Ambulatory Visit (HOSPITAL_BASED_OUTPATIENT_CLINIC_OR_DEPARTMENT_OTHER)
Admission: RE | Admit: 2022-06-16 | Discharge: 2022-06-16 | Disposition: A | Discharge status: Home / Self Care | Payer: BC Managed Care – PPO | Source: Ambulatory Visit | Attending: Internal Medicine | Admitting: Internal Medicine

## 2022-06-16 ENCOUNTER — Other Ambulatory Visit: Payer: Self-pay

## 2022-06-16 VITALS — BP 106/74 | HR 98 | Temp 98.5°F | Ht 66.0 in | Wt 144.4 lb

## 2022-06-16 DIAGNOSIS — R310 Gross hematuria: Secondary | ICD-10-CM | POA: Diagnosis not present

## 2022-06-16 DIAGNOSIS — R300 Dysuria: Secondary | ICD-10-CM | POA: Diagnosis not present

## 2022-06-16 DIAGNOSIS — R1031 Right lower quadrant pain: Secondary | ICD-10-CM

## 2022-06-16 DIAGNOSIS — N200 Calculus of kidney: Secondary | ICD-10-CM | POA: Diagnosis not present

## 2022-06-16 DIAGNOSIS — R350 Frequency of micturition: Secondary | ICD-10-CM | POA: Diagnosis not present

## 2022-06-16 LAB — POC URINALSYSI DIPSTICK (AUTOMATED)
Glucose, UA: NEGATIVE
Leukocytes, UA: NEGATIVE
Nitrite, UA: NEGATIVE
Protein, UA: POSITIVE — AB
Spec Grav, UA: >=1.03 — AB (ref 1.010–1.025)
Urobilinogen, UA: 0.2 E.U./dL
pH, UA: 6 (ref 5.0–8.0)

## 2022-06-16 MED ORDER — NITROFURANTOIN MONOHYD MACRO 100 MG PO CAPS: 100.0000 mg | ORAL_CAPSULE | Freq: Two times a day (BID) | ORAL | 0 refills | Status: DC

## 2022-06-16 NOTE — Patient Instructions (Addendum)
      Medications changes include :   nitrofurantoin bid x 1 week    A Ct of your kidneys was ordered.      Return if symptoms worsen or fail to improve.

## 2022-06-18 ENCOUNTER — Encounter: Payer: Self-pay | Admitting: Internal Medicine

## 2022-06-18 LAB — CULTURE, URINE COMPREHENSIVE

## 2022-07-05 ENCOUNTER — Other Ambulatory Visit: Payer: Self-pay | Admitting: Internal Medicine

## 2022-07-12 DIAGNOSIS — Z124 Encounter for screening for malignant neoplasm of cervix: Secondary | ICD-10-CM | POA: Diagnosis not present

## 2022-07-12 DIAGNOSIS — Z1231 Encounter for screening mammogram for malignant neoplasm of breast: Secondary | ICD-10-CM | POA: Diagnosis not present

## 2022-07-12 DIAGNOSIS — Z6824 Body mass index (BMI) 24.0-24.9, adult: Secondary | ICD-10-CM | POA: Diagnosis not present

## 2022-07-12 DIAGNOSIS — Z01419 Encounter for gynecological examination (general) (routine) without abnormal findings: Secondary | ICD-10-CM | POA: Diagnosis not present

## 2022-07-21 DIAGNOSIS — F902 Attention-deficit hyperactivity disorder, combined type: Secondary | ICD-10-CM | POA: Diagnosis not present

## 2022-07-21 DIAGNOSIS — Z79899 Other long term (current) drug therapy: Secondary | ICD-10-CM | POA: Diagnosis not present

## 2022-07-28 ENCOUNTER — Encounter: Payer: BC Managed Care – PPO | Admitting: Internal Medicine

## 2022-08-11 ENCOUNTER — Ambulatory Visit (INDEPENDENT_AMBULATORY_CARE_PROVIDER_SITE_OTHER): Payer: BC Managed Care – PPO | Admitting: Internal Medicine

## 2022-08-11 ENCOUNTER — Encounter: Payer: Self-pay | Admitting: Internal Medicine

## 2022-08-11 VITALS — BP 120/70 | HR 92 | Temp 98.4°F | Wt 150.0 lb

## 2022-08-11 DIAGNOSIS — Z13220 Encounter for screening for lipoid disorders: Secondary | ICD-10-CM | POA: Diagnosis not present

## 2022-08-11 DIAGNOSIS — E538 Deficiency of other specified B group vitamins: Secondary | ICD-10-CM | POA: Diagnosis not present

## 2022-08-11 DIAGNOSIS — E559 Vitamin D deficiency, unspecified: Secondary | ICD-10-CM

## 2022-08-11 DIAGNOSIS — I10 Essential (primary) hypertension: Secondary | ICD-10-CM

## 2022-08-11 DIAGNOSIS — F4321 Adjustment disorder with depressed mood: Secondary | ICD-10-CM | POA: Insufficient documentation

## 2022-08-11 DIAGNOSIS — Z0000 Encounter for general adult medical examination without abnormal findings: Secondary | ICD-10-CM

## 2022-08-11 DIAGNOSIS — F902 Attention-deficit hyperactivity disorder, combined type: Secondary | ICD-10-CM

## 2022-08-11 LAB — COMPREHENSIVE METABOLIC PANEL
ALT: 10 U/L (ref 0–35)
AST: 13 U/L (ref 0–37)
Albumin: 4.4 g/dL (ref 3.5–5.2)
Alkaline Phosphatase: 31 U/L — ABNORMAL LOW (ref 39–117)
BUN: 15 mg/dL (ref 6–23)
CO2: 27 mEq/L (ref 19–32)
Calcium: 9.1 mg/dL (ref 8.4–10.5)
Chloride: 103 mEq/L (ref 96–112)
Creatinine, Ser: 1.01 mg/dL (ref 0.40–1.20)
GFR: 66.98 mL/min (ref >60.00)
Glucose, Bld: 93 mg/dL (ref 70–99)
Potassium: 4.3 mEq/L (ref 3.5–5.1)
Sodium: 136 mEq/L (ref 135–145)
Total Bilirubin: 0.3 mg/dL (ref 0.2–1.2)
Total Protein: 6.9 g/dL (ref 6.0–8.3)

## 2022-08-11 LAB — CBC WITH DIFFERENTIAL/PLATELET
Basophils Absolute: 0 10*3/uL (ref 0.0–0.1)
Basophils Relative: 0.8 % (ref 0.0–3.0)
Eosinophils Absolute: 0.1 10*3/uL (ref 0.0–0.7)
Eosinophils Relative: 1.1 % (ref 0.0–5.0)
HCT: 38.7 % (ref 36.0–46.0)
Hemoglobin: 12.9 g/dL (ref 12.0–15.0)
Lymphocytes Relative: 23.9 % (ref 12.0–46.0)
Lymphs Abs: 1.3 10*3/uL (ref 0.7–4.0)
MCHC: 33.5 g/dL (ref 30.0–36.0)
MCV: 87.3 fl (ref 78.0–100.0)
Monocytes Absolute: 0.5 10*3/uL (ref 0.1–1.0)
Monocytes Relative: 9.8 % (ref 3.0–12.0)
Neutro Abs: 3.6 10*3/uL (ref 1.4–7.7)
Neutrophils Relative %: 64.4 % (ref 43.0–77.0)
Platelets: 385 10*3/uL (ref 150.0–400.0)
RBC: 4.43 Mil/uL (ref 3.87–5.11)
RDW: 14.4 % (ref 11.5–15.5)
WBC: 5.6 10*3/uL (ref 4.0–10.5)

## 2022-08-11 LAB — URINALYSIS
Bilirubin Urine: NEGATIVE
Hgb urine dipstick: NEGATIVE
Ketones, ur: NEGATIVE
Leukocytes,Ua: NEGATIVE
Nitrite: NEGATIVE
Specific Gravity, Urine: 1.02 (ref 1.000–1.030)
Total Protein, Urine: NEGATIVE
Urine Glucose: NEGATIVE
Urobilinogen, UA: 0.2 (ref 0.0–1.0)
pH: 7 (ref 5.0–8.0)

## 2022-08-11 LAB — LIPID PANEL
Cholesterol: 194 mg/dL (ref 0–200)
HDL: 55.1 mg/dL (ref >39.00)
LDL Cholesterol: 123 mg/dL — ABNORMAL HIGH (ref 0–99)
NonHDL: 138.57
Total CHOL/HDL Ratio: 4
Triglycerides: 78 mg/dL (ref 0.0–149.0)
VLDL: 15.6 mg/dL (ref 0.0–40.0)

## 2022-08-11 LAB — TSH: TSH: 4.37 u[IU]/mL (ref 0.35–5.50)

## 2022-08-11 LAB — VITAMIN D 25 HYDROXY (VIT D DEFICIENCY, FRACTURES): VITD: 29.25 ng/mL — ABNORMAL LOW (ref 30.00–100.00)

## 2022-08-11 LAB — VITAMIN B12: Vitamin B-12: 255 pg/mL (ref 211–911)

## 2022-08-11 NOTE — Assessment & Plan Note (Signed)
On B12 

## 2022-08-11 NOTE — Assessment & Plan Note (Signed)
Mom died in July 2023 - breast cancer

## 2022-08-11 NOTE — Assessment & Plan Note (Signed)
  We discussed age appropriate health related issues, including available/recomended screening tests and vaccinations. Labs were ordered to be later reviewed . All questions were answered. We discussed one or more of the following - seat belt use, use of sunscreen/sun exposure exercise, fall risk reduction, second hand smoke exposure, firearm use and storage, seat belt use, a need for adhering to healthy diet and exercise. Labs were ordered.  All questions were answered. Colonoscopy - Dr Watt Climes due in 2023 - never had. Cologuard discussed Mammo/PAP w/ Dr Corinna Capra

## 2022-08-11 NOTE — Assessment & Plan Note (Signed)
On Vit D 

## 2022-08-11 NOTE — Progress Notes (Signed)
Subjective:  Patient ID: [name], female    DOB: 1976-02-19  Age: 47 y.o. MRN: [id number]  CC: No chief complaint on file.   HPI [name] presents for a well exam  Outpatient Medications Prior to Visit  Medication Sig Dispense Refill   ADDERALL XR 25 MG 24 hr capsule Take 25 mg by mouth daily at 12 noon.     ADDERALL XR 30 MG 24 hr capsule Take 30 mg by mouth in the morning.     ALPRAZolam (XANAX) 0.5 MG tablet Take 0.5 mg by mouth at bedtime as needed for anxiety.     amLODipine-olmesartan (AZOR) 5-20 MG tablet TAKE ONE TABLET BY MOUTH ONCE DAILY Annual appt due in Oct must see provider for future refills 90 tablet 0   buPROPion (WELLBUTRIN XL) 300 MG 24 hr tablet Take 300 mg by mouth daily.     cyanocobalamin (,VITAMIN B-12,) 1000 MCG/ML injection Inject 1 mL (1,000 mcg total) into the skin every 14 (fourteen) days. 10 mL 6   Levonorgestrel-Ethinyl Estradiol (AMETHIA) 0.15-0.03 &0.01 MG tablet Take 1 tablet by mouth daily. Camrese     pantoprazole (PROTONIX) 40 MG tablet TAKE ONE tablet Orally twice a DAY 30 minutes BEFORE eating     nitrofurantoin, macrocrystal-monohydrate, (MACROBID) 100 MG capsule Take 1 capsule (100 mg total) by mouth 2 (two) times daily. 14 capsule 0   No facility-administered medications prior to visit.    ROS: Review of Systems  Constitutional:  Negative for activity change, appetite change, chills, fatigue and unexpected weight change.  HENT:  Negative for congestion, mouth sores and sinus pressure.   Eyes:  Negative for visual disturbance.  Respiratory:  Negative for cough and chest tightness.   Gastrointestinal:  Negative for abdominal pain and nausea.  Genitourinary:  Negative for difficulty urinating, frequency and vaginal pain.  Musculoskeletal:  Negative for back pain and gait problem.  Skin:  Negative for pallor and rash.  Neurological:  Negative for dizziness, tremors, weakness, numbness and headaches.  Psychiatric/Behavioral:   Positive for decreased concentration. Negative for confusion and sleep disturbance.     Objective:  BP 120/70 (BP Location: Left Arm, Patient Position: Sitting, Cuff Size: Large)   Pulse 92   Temp 98.4 F (36.9 C) (Oral)   Wt 150 lb (68 kg)   SpO2 96%   BMI 24.21 kg/m   BP Readings from Last 3 Encounters:  08/11/22 120/70  06/16/22 106/74  03/17/22 110/62    Wt Readings from Last 3 Encounters:  08/11/22 150 lb (68 kg)  06/16/22 144 lb 6.4 oz (65.5 kg)  03/17/22 144 lb (65.3 kg)    Physical Exam Constitutional:      General: She is not in acute distress.    Appearance: She is well-developed.  HENT:     Head: Normocephalic.     Right Ear: External ear normal.     Left Ear: External ear normal.     Nose: Nose normal.  Eyes:     General:        Right eye: No discharge.        Left eye: No discharge.     Conjunctiva/sclera: Conjunctivae normal.     Pupils: Pupils are equal, round, and reactive to light.  Neck:     Thyroid: No thyromegaly.     Vascular: No JVD.     Trachea: No tracheal deviation.  Cardiovascular:     Rate and Rhythm: Normal rate and regular rhythm.  Heart sounds: Normal heart sounds.  Pulmonary:     Effort: No respiratory distress.     Breath sounds: No stridor. No wheezing.  Abdominal:     General: Bowel sounds are normal. There is no distension.     Palpations: Abdomen is soft. There is no mass.     Tenderness: There is no abdominal tenderness. There is no guarding or rebound.  Musculoskeletal:        General: No tenderness.     Cervical back: Normal range of motion and neck supple. No rigidity.  Lymphadenopathy:     Cervical: No cervical adenopathy.  Skin:    Findings: No erythema or rash.  Neurological:     Cranial Nerves: No cranial nerve deficit.     Motor: No abnormal muscle tone.     Coordination: Coordination normal.     Deep Tendon Reflexes: Reflexes normal.  Psychiatric:        Behavior: Behavior normal.        Thought  Content: Thought content normal.        Judgment: Judgment normal.     Lab Results  Component Value Date   WBC 5.8 03/17/2022   HGB 12.4 03/17/2022   HCT 37.6 03/17/2022   PLT 342.0 03/17/2022   GLUCOSE 94 03/17/2022   CHOL 189 05/11/2021   TRIG 92.0 05/11/2021   HDL 45.40 05/11/2021   LDLCALC 125 (H) 05/11/2021   ALT 8 03/17/2022   AST 10 03/17/2022   NA 135 03/17/2022   K 4.0 03/17/2022   CL 105 03/17/2022   CREATININE 1.04 03/17/2022   BUN 24 (H) 03/17/2022   CO2 25 03/17/2022   TSH 2.88 03/17/2022    CT RENAL STONE STUDY  Result Date: 06/17/2022 CLINICAL DATA:  Flank pain.  Concern for kidney stone. EXAM: CT ABDOMEN AND PELVIS WITHOUT CONTRAST TECHNIQUE: Multidetector CT imaging of the abdomen and pelvis was performed following the standard protocol without IV contrast. RADIATION DOSE REDUCTION: This exam was performed according to the departmental dose-optimization program which includes automated exposure control, adjustment of the mA and/or kV according to patient size and/or use of iterative reconstruction technique. COMPARISON:  CT abdomen pelvis dated 01/22/2021. FINDINGS: Evaluation of this exam is limited in the absence of intravenous contrast. Lower chest: The visualized lung bases are clear. No intra-abdominal free air or free fluid. Hepatobiliary: The liver is unremarkable. No biliary dilatation. The gallbladder is unremarkable. Pancreas: Unremarkable. No pancreatic ductal dilatation or surrounding inflammatory changes. Spleen: Normal in size without focal abnormality. Adrenals/Urinary Tract: The adrenal glands unremarkable. Punctate nonobstructing left renal inferior pole calculi. No hydronephrosis or obstructing stone. The right kidney is unremarkable. The visualized ureters are unremarkable. The urinary bladder is collapsed. Stomach/Bowel: Moderate stool throughout the colon. There is no bowel obstruction or active inflammation. The appendix is normal.  Vascular/Lymphatic: The abdominal aorta and IVC are grossly unremarkable on this noncontrast CT. No portal venous gas. There is no adenopathy. Reproductive: The uterus is retroverted and grossly unremarkable. No adnexal masses. Other: None Musculoskeletal: No acute or significant osseous findings. IMPRESSION: 1. Punctate nonobstructing left renal inferior pole calculi. No hydronephrosis or obstructing stone. 2. No bowel obstruction. Normal appendix. Electronically Signed   By: Anner Crete M.D.   On: 06/17/2022 19:12    Assessment & Plan:   Problem List Items Addressed This Visit       Cardiovascular and Mediastinum   HTN (hypertension)     Adderall, Wellbutrin, BCP On Azor  Other   Well adult exam - Primary     We discussed age appropriate health related issues, including available/recomended screening tests and vaccinations. Labs were ordered to be later reviewed . All questions were answered. We discussed one or more of the following - seat belt use, use of sunscreen/sun exposure exercise, fall risk reduction, second hand smoke exposure, firearm use and storage, seat belt use, a need for adhering to healthy diet and exercise. Labs were ordered.  All questions were answered. Colonoscopy - Dr Watt Climes due in 2023 - never had. Cologuard discussed Mammo/PAP w/ Dr Corinna Capra       Relevant Orders   TSH   Urinalysis   CBC with Differential/Platelet   Lipid panel   Comprehensive metabolic panel   Vitamin D deficiency    On Vit D      Relevant Orders   VITAMIN D 25 Hydroxy (Vit-D Deficiency, Fractures)   Vitamin B 12 deficiency    On B12      Relevant Orders   Vitamin B12   Grief    Mom died in 02-02-2022 - breast cancer      ADD (attention deficit disorder)    On Wellbutrin         No orders of the defined types were placed in this encounter.     Follow-up: No follow-ups on file.  Walker Kehr, MD

## 2022-08-11 NOTE — Assessment & Plan Note (Addendum)
Adderall, Wellbutrin, BCP On Azor

## 2022-08-11 NOTE — Assessment & Plan Note (Signed)
On Wellbutrin

## 2022-08-12 ENCOUNTER — Other Ambulatory Visit: Payer: Self-pay | Admitting: Internal Medicine

## 2022-08-12 MED ORDER — VITAMIN D3 50 MCG (2000 UT) PO CAPS: 2000.0000 [IU] | ORAL_CAPSULE | Freq: Every day | ORAL | 3 refills | Status: AC

## 2022-08-12 MED ORDER — B COMPLEX PLUS PO TABS: 1.0000 | ORAL_TABLET | Freq: Every day | ORAL | 3 refills | Status: AC

## 2022-08-21 ENCOUNTER — Other Ambulatory Visit: Payer: Self-pay | Admitting: Internal Medicine

## 2022-09-24 ENCOUNTER — Telehealth: Payer: Self-pay | Admitting: Family Medicine

## 2022-09-24 NOTE — Telephone Encounter (Signed)
Patient called stating that she was diagnosed with a sacral insufficiency fracture in 2018. Today at work, she was standing and had very bad pain in that area where she had to sit down.  She said that there are certain movements that cause pain but she is okay otherwise.  I offered her an appointment on Monday and advised that if she needed to be seen over the weekend to go to UC.  She expressed understanding and would call us Monday if needed.  FYI.

## 2022-09-27 NOTE — Telephone Encounter (Addendum)
Noted. Called pt and left VM that I'm calling to f/u to see how pt is feeling today.

## 2022-10-18 DIAGNOSIS — Z79891 Long term (current) use of opiate analgesic: Secondary | ICD-10-CM | POA: Diagnosis not present

## 2022-10-18 DIAGNOSIS — Z79899 Other long term (current) drug therapy: Secondary | ICD-10-CM | POA: Diagnosis not present

## 2022-10-18 DIAGNOSIS — F902 Attention-deficit hyperactivity disorder, combined type: Secondary | ICD-10-CM | POA: Diagnosis not present

## 2022-10-18 DIAGNOSIS — Z5181 Encounter for therapeutic drug level monitoring: Secondary | ICD-10-CM | POA: Diagnosis not present

## 2023-01-10 ENCOUNTER — Other Ambulatory Visit: Payer: Self-pay

## 2023-01-10 ENCOUNTER — Encounter (HOSPITAL_BASED_OUTPATIENT_CLINIC_OR_DEPARTMENT_OTHER): Payer: Self-pay

## 2023-01-10 ENCOUNTER — Emergency Department (HOSPITAL_BASED_OUTPATIENT_CLINIC_OR_DEPARTMENT_OTHER)
Admission: EM | Admit: 2023-01-10 | Discharge: 2023-01-10 | Disposition: A | Discharge status: Home / Self Care | Payer: BC Managed Care – PPO | Attending: Emergency Medicine | Admitting: Emergency Medicine

## 2023-01-10 ENCOUNTER — Emergency Department (HOSPITAL_BASED_OUTPATIENT_CLINIC_OR_DEPARTMENT_OTHER): Payer: BC Managed Care – PPO

## 2023-01-10 DIAGNOSIS — M6281 Muscle weakness (generalized): Secondary | ICD-10-CM | POA: Diagnosis not present

## 2023-01-10 DIAGNOSIS — R350 Frequency of micturition: Secondary | ICD-10-CM | POA: Insufficient documentation

## 2023-01-10 DIAGNOSIS — R319 Hematuria, unspecified: Secondary | ICD-10-CM | POA: Diagnosis not present

## 2023-01-10 DIAGNOSIS — R631 Polydipsia: Secondary | ICD-10-CM | POA: Diagnosis not present

## 2023-01-10 DIAGNOSIS — R410 Disorientation, unspecified: Secondary | ICD-10-CM | POA: Diagnosis not present

## 2023-01-10 DIAGNOSIS — R1031 Right lower quadrant pain: Secondary | ICD-10-CM | POA: Insufficient documentation

## 2023-01-10 DIAGNOSIS — N200 Calculus of kidney: Secondary | ICD-10-CM | POA: Diagnosis not present

## 2023-01-10 DIAGNOSIS — N83202 Unspecified ovarian cyst, left side: Secondary | ICD-10-CM | POA: Diagnosis not present

## 2023-01-10 DIAGNOSIS — R110 Nausea: Secondary | ICD-10-CM | POA: Diagnosis not present

## 2023-01-10 LAB — URINALYSIS, ROUTINE W REFLEX MICROSCOPIC
Bilirubin Urine: NEGATIVE
Glucose, UA: NEGATIVE mg/dL
Ketones, ur: 80 mg/dL — AB
Leukocytes,Ua: NEGATIVE
Nitrite: NEGATIVE
Protein, ur: 30 mg/dL — AB
Specific Gravity, Urine: >=1.03 (ref 1.005–1.030)
pH: 6 (ref 5.0–8.0)

## 2023-01-10 LAB — CBC WITH DIFFERENTIAL/PLATELET
Abs Immature Granulocytes: 0.03 10*3/uL (ref 0.00–0.07)
Basophils Absolute: 0.1 10*3/uL (ref 0.0–0.1)
Basophils Relative: 1 %
Eosinophils Absolute: 0 10*3/uL (ref 0.0–0.5)
Eosinophils Relative: 0 %
HCT: 38.5 % (ref 36.0–46.0)
Hemoglobin: 12.4 g/dL (ref 12.0–15.0)
Immature Granulocytes: 1 %
Lymphocytes Relative: 12 %
Lymphs Abs: 0.8 10*3/uL (ref 0.7–4.0)
MCH: 27.8 pg (ref 26.0–34.0)
MCHC: 32.2 g/dL (ref 30.0–36.0)
MCV: 86.3 fL (ref 80.0–100.0)
Monocytes Absolute: 0.5 10*3/uL (ref 0.1–1.0)
Monocytes Relative: 7 %
Neutro Abs: 5.2 10*3/uL (ref 1.7–7.7)
Neutrophils Relative %: 79 %
Platelets: 378 10*3/uL (ref 150–400)
RBC: 4.46 MIL/uL (ref 3.87–5.11)
RDW: 13.4 % (ref 11.5–15.5)
WBC: 6.6 10*3/uL (ref 4.0–10.5)
nRBC: 0 % (ref 0.0–0.2)

## 2023-01-10 LAB — COMPREHENSIVE METABOLIC PANEL
ALT: 11 U/L (ref 0–44)
AST: 11 U/L — ABNORMAL LOW (ref 15–41)
Albumin: 4.5 g/dL (ref 3.5–5.0)
Alkaline Phosphatase: 27 U/L — ABNORMAL LOW (ref 38–126)
Anion gap: 8 (ref 5–15)
BUN: 13 mg/dL (ref 6–20)
CO2: 26 mmol/L (ref 22–32)
Calcium: 9.8 mg/dL (ref 8.9–10.3)
Chloride: 104 mmol/L (ref 98–111)
Creatinine, Ser: 1.06 mg/dL — ABNORMAL HIGH (ref 0.44–1.00)
GFR, Estimated: >60 mL/min (ref >60)
Glucose, Bld: 106 mg/dL — ABNORMAL HIGH (ref 70–99)
Potassium: 4.2 mmol/L (ref 3.5–5.1)
Sodium: 138 mmol/L (ref 135–145)
Total Bilirubin: 0.7 mg/dL (ref 0.3–1.2)
Total Protein: 6.8 g/dL (ref 6.5–8.1)

## 2023-01-10 LAB — URINALYSIS, MICROSCOPIC (REFLEX): RBC / HPF: NONE SEEN RBC/hpf (ref 0–5)

## 2023-01-10 LAB — LIPASE, BLOOD: Lipase: 30 U/L (ref 11–51)

## 2023-01-10 LAB — PREGNANCY, URINE: Preg Test, Ur: NEGATIVE

## 2023-01-10 MED ORDER — SODIUM CHLORIDE 0.9 % IV BOLUS
1000.0000 mL | Freq: Once | INTRAVENOUS | Status: AC
  Administered 2023-01-10: 1000 mL via INTRAVENOUS

## 2023-01-10 MED ORDER — ONDANSETRON HCL 4 MG/2ML IJ SOLN
4.0000 mg | Freq: Once | INTRAMUSCULAR | Status: AC
  Administered 2023-01-10: 4 mg via INTRAVENOUS
  Filled 2023-01-10: qty 2

## 2023-01-10 NOTE — ED Notes (Signed)
Spoke with lab to add on urine culture.  

## 2023-01-10 NOTE — ED Notes (Signed)
Pt. Walked to the restroom with no difficulty.  Pt. In no distress.

## 2023-01-10 NOTE — ED Triage Notes (Signed)
Pt c/o urinary frequency and RLQ pain x 2 days, along with with weakness. Pt endorses nausea. Denies vomiting or diarrhea. Denies cough, SHOB. Pt has been unable to eat the last 2 days, endorses thirst

## 2023-01-10 NOTE — Discharge Instructions (Signed)
Return to the ED with any new or worsening signs or symptoms Please follow-up with your PCP for reevaluation We are culturing your urine.  If it grows bacteria, a pharmacist will call you tomorrow and placed on appropriate antibiotic therapy.  If you do not receive a call tomorrow, this means that your urine did not grow bacteria. Please continue pushing fluids and taking Zofran as needed

## 2023-01-10 NOTE — ED Provider Notes (Signed)
[place] [facility] AT [facility] Provider Note   CSN: [id number] Arrival date & time: 01/10/23  4782     History  Chief Complaint  Patient presents with   Abdominal Pain   Urinary Frequency    [name] is a 47 y.o. female with medical history of depression and low back pain.  The patient presents to the ED for evaluation of urinary frequency, abdominal pain and nausea.  The patient reports that beginning 2 to 3 days ago she developed progressively worsening pain in her right flank and right lower quadrant.  She states she has also been having urinary frequency and has noticed that her urine does appear darker.  Denies history of diabetes. She denies any dysuria, vaginal discharge.  She denies any fevers at home.  She does have a history of kidney stones 1 year ago.  She denies any diarrhea, chest pain or shortness of breath.  She reports that she became so nauseous last night that she took a Zofran which did alleviate her nausea.  She states that she was also sweating an extreme amount requiring her to change her clothes 2 times.  The patient reports that she was seen in urgent care prior to arrival.  They redirected her to the ED for need of CT scan of her belly due to her endorsing right lower quadrant pain.  She reports history of constipation, does not take MiraLAX or Colace.   Abdominal Pain Associated symptoms: constipation and nausea   Associated symptoms: no vomiting   Urinary Frequency Associated symptoms include abdominal pain.       Home Medications Prior to Admission medications   Medication Sig Start Date End Date Taking? Authorizing Provider  ADDERALL XR 25 MG 24 hr capsule Take 25 mg by mouth daily at 12 noon. 01/24/19   [provider]  ADDERALL XR 30 MG 24 hr capsule Take 30 mg by mouth in the morning. 02/09/19   [provider]  ALPRAZolam Prudy Feeler) 0.5 MG tablet Take 0.5 mg by mouth at bedtime as needed for anxiety.     [provider]  amLODipine-olmesartan (AZOR) 10-40 MG tablet Take 1/2 tablet by mouth once a daily 08/23/22   Plotnikov, Georgina Quint, MD  amLODipine-olmesartan (AZOR) 5-20 MG tablet TAKE ONE TABLET BY MOUTH ONCE DAILY Annual appt due in Oct must see provider for future refills 04/16/22   Plotnikov, Georgina Quint, MD  B Complex-Folic Acid (B COMPLEX PLUS) TABS Take 1 tablet by mouth daily. 08/12/22   Plotnikov, Georgina Quint, MD  buPROPion (WELLBUTRIN XL) 300 MG 24 hr tablet Take 300 mg by mouth daily.    [provider]  Cholecalciferol (VITAMIN D3) 50 MCG (2000 UT) capsule Take 1 capsule (2,000 Units total) by mouth daily. 08/12/22   Plotnikov, Georgina Quint, MD  Levonorgestrel-Ethinyl Estradiol (AMETHIA) 0.15-0.03 &0.01 MG tablet Take 1 tablet by mouth daily. Camrese    [provider]  pantoprazole (PROTONIX) 40 MG tablet TAKE ONE tablet Orally twice a DAY 30 minutes BEFORE eating    [provider]      Allergies    Strattera [atomoxetine hcl]    Review of Systems   Review of Systems  Constitutional:  Positive for appetite change.  Gastrointestinal:  Positive for abdominal pain, constipation and nausea. Negative for vomiting.  Genitourinary:  Positive for frequency.  All other systems reviewed and are negative.   Physical Exam Updated Vital Signs BP 117/84   Pulse (!) 54  Temp 98.2 F (36.8 C)   Resp 16   Ht 5\' 6"  (1.676 m)   Wt 65.3 kg   LMP 01/10/2023 Comment: negative u-preg 01/10/23  SpO2 100%   BMI 23.24 kg/m  Physical Exam Vitals and nursing note reviewed.  Constitutional:      General: She is not in acute distress.    Appearance: Normal appearance. She is not ill-appearing, toxic-appearing or diaphoretic.  HENT:     Head: Normocephalic and atraumatic.     Nose: Nose normal.     Mouth/Throat:     Mouth: Mucous membranes are moist.     Pharynx: Oropharynx is clear.  Eyes:     Extraocular Movements: Extraocular movements intact.      Conjunctiva/sclera: Conjunctivae normal.     Pupils: Pupils are equal, round, and reactive to light.  Cardiovascular:     Rate and Rhythm: Normal rate and regular rhythm.  Pulmonary:     Effort: Pulmonary effort is normal.  Abdominal:     General: Abdomen is flat.     Tenderness: There is abdominal tenderness. There is no right CVA tenderness or left CVA tenderness.     Comments: RLQ  Musculoskeletal:     Cervical back: Normal range of motion and neck supple. No tenderness.  Skin:    General: Skin is warm and dry.     Capillary Refill: Capillary refill takes less than 2 seconds.  Neurological:     Mental Status: She is alert and oriented to person, place, and time.     ED Results / Procedures / Treatments   Labs (all labs ordered are listed, but only abnormal results are displayed) Labs Reviewed  COMPREHENSIVE METABOLIC PANEL - Abnormal; Notable for the following components:      Result Value   Glucose, Bld 106 (*)    Creatinine, Ser 1.06 (*)    AST 11 (*)    Alkaline Phosphatase 27 (*)    All other components within normal limits  URINALYSIS, ROUTINE W REFLEX MICROSCOPIC - Abnormal; Notable for the following components:   Hgb urine dipstick SMALL (*)    Ketones, ur 80 (*)    Protein, ur 30 (*)    All other components within normal limits  URINALYSIS, MICROSCOPIC (REFLEX) - Abnormal; Notable for the following components:   Bacteria, UA MANY (*)    Non Squamous Epithelial PRESENT (*)    All other components within normal limits  URINE CULTURE  CBC WITH DIFFERENTIAL/PLATELET  LIPASE, BLOOD  PREGNANCY, URINE    EKG None  Radiology CT RENAL STONE STUDY  Result Date: 01/10/2023 CLINICAL DATA:  Urinary frequency and right lower quadrant pain for 2 days. EXAM: CT ABDOMEN AND PELVIS WITHOUT CONTRAST TECHNIQUE: Multidetector CT imaging of the abdomen and pelvis was performed following the standard protocol without IV contrast. RADIATION DOSE REDUCTION: This exam was  performed according to the departmental dose-optimization program which includes automated exposure control, adjustment of the mA and/or kV according to patient size and/or use of iterative reconstruction technique. COMPARISON:  CT abdomen/pelvis 06/16/2022 FINDINGS: Lower chest: The lung bases are clear. The imaged heart is unremarkable. Hepatobiliary: The liver and gallbladder are unremarkable. There is no biliary ductal dilatation. Pancreas: Unremarkable. Spleen: Unremarkable. Adrenals/Urinary Tract: The adrenals are unremarkable. There is a punctate nonobstructing left lower pole renal stone. There is no stone along the course of either ureter. There is no hydronephrosis or hydroureter. There are no suspicious parenchymal lesions, within the confines of noncontrast technique. Stomach/Bowel: The  bladder is decompressed but grossly unremarkable. Vascular/Lymphatic: The stomach is unremarkable. There is no evidence of bowel obstruction. There is no abnormal bowel wall thickening or inflammatory change. There is a moderate stool burden throughout the colon. The appendix is normal. Reproductive: The uterus is unremarkable. There is a 2.9 cm left adnexal cyst requiring no specific imaging follow-up. There is no right adnexal lesion. Other: There is no ascites or free air. Musculoskeletal: There is no acute osseous abnormality or suspicious osseous lesion. IMPRESSION: 1. No acute findings in the abdomen or pelvis. Normal appendix. 2. Punctate nonobstructing left lower pole renal stone. 3. Moderate stool burden throughout the colon. Electronically Signed   By: Lesia Hausen M.D.   On: 01/10/2023 11:07    Procedures Procedures   Medications Ordered in ED Medications  sodium chloride 0.9 % bolus 1,000 mL (1,000 mLs Intravenous New Bag/Given 01/10/23 1239)  ondansetron (ZOFRAN) injection 4 mg (4 mg Intravenous Given 01/10/23 1243)    ED Course/ Medical Decision Making/ A&P  Medical Decision Making  47 year old  female presents to the ED for evaluation of right lower quadrant pain.  Please see HPI for further details.  On examination patient is afebrile and nontachycardic.  Her lung sounds are clear bilaterally and she is not hypoxic.  She has no CVA tenderness bilaterally, she has tenderness in the right lower quadrant.  Neurological examination at baseline.  CBC without leukocytosis or anemia.  Pregnancy test negative.  Urinalysis shows small hemoglobin, ketones and protein.  Microscopic reflex urinalysis shows bacteria and non-squamous cells along with mucus.  Patient denies any dysuria.  Will culture patient urine and if grows out bacteria we will place on appropriate antibiotic therapy.  Patient metabolic panel shows creatinine 1.06 which is baseline, AST 11, alk phos 27, no anion gap elevation.  Lipase WNL.  CT renal stone study shows nonobstructing left lower pole kidney stone, moderate colonic stool burden however no acute process identified.  No evidence of appendicitis.  Patient given 1 L fluid, 4 mg Zofran for nausea.  On reassessment, patient reports feeling slightly better but still curious as to why she has right lower quadrant pain.  The patient reports longstanding history of constipation, denies taking MiraLAX or Colace for this.  I have advised the patient that we will culture her urine and if it has a growth we will place her on appropriate antibiotic therapy.  I advised the patient to trial MiraLAX at home to see if that alleviates her abdominal pain.  She is in agreement with this plan of management.  She was advised to return to the ED with any new or worsening signs or symptoms and she voiced understanding.  She had all of her questions answered to her satisfaction.  She is stable to discharge home at this time.   Final Clinical Impression(s) / ED Diagnoses Final diagnoses:  Right lower quadrant abdominal pain    Rx / DC Orders ED Discharge Orders     None         Clent Ridges 01/10/23 1331    Lonell Grandchild, MD 01/10/23 1504

## 2023-01-11 ENCOUNTER — Encounter: Payer: Self-pay | Admitting: Gastroenterology

## 2023-01-11 ENCOUNTER — Telehealth: Payer: Self-pay

## 2023-01-11 LAB — URINE CULTURE: Culture: <10000 — AB

## 2023-01-11 NOTE — Transitions of Care (Post Inpatient/ED Visit) (Signed)
   01/11/2023  Name: [name] MRN: [id number] DOB: 1976/02/17  Today's TOC FU Call Status: Today's TOC FU Call Status:: Successful TOC FU Call Competed TOC FU Call Complete Date: 01/11/23  Transition Care Management Follow-up Telephone Call Date of Discharge: 01/10/23 Discharge Facility: [facility] Type of Discharge: Emergency Department Reason for ED Visit: Other: (RLQ pain) How have you been since you were released from the hospital?: Same Any questions or concerns?: No  Items Reviewed: Did you receive and understand the discharge instructions provided?: Yes Medications obtained,verified, and reconciled?: Yes (Medications Reviewed) Any new allergies since your discharge?: No Dietary orders reviewed?: Yes Do you have support at home?: Yes People in Home: spouse  Medications Reviewed Today: Medications Reviewed Today     Reviewed by Karena Addison, LPN (Licensed Practical Nurse) on 01/11/23 at 1336  Med List Status: <None>   Medication Order Taking? Sig Documenting Provider Last Dose Status Informant  ADDERALL XR 25 MG 24 hr capsule [id number] No Take 25 mg by mouth daily at 12 noon. [provider] Taking Active Self  ADDERALL XR 30 MG 24 hr capsule [id number] No Take 30 mg by mouth in the morning. [provider] Taking Active Self  ALPRAZolam (XANAX) 0.5 MG tablet [id number] No Take 0.5 mg by mouth at bedtime as needed for anxiety. [provider] Taking Active Self  amLODipine-olmesartan (AZOR) 10-40 MG tablet [id number]  Take 1/2 tablet by mouth once a daily Plotnikov, Georgina Quint, MD  Active   amLODipine-olmesartan (AZOR) 5-20 MG tablet [id number] No TAKE ONE TABLET BY MOUTH ONCE DAILY Annual appt due in Oct must see provider for future refills Plotnikov, Georgina Quint, MD Taking Active   B Complex-Folic Acid (B COMPLEX PLUS) TABS [id number]  Take 1 tablet by mouth daily. Plotnikov, Georgina Quint, MD  Active   buPROPion (WELLBUTRIN XL) 300 MG 24 hr  tablet [id number] No Take 300 mg by mouth daily. [provider] Taking Active Self  Cholecalciferol (VITAMIN D3) 50 MCG (2000 UT) capsule [id number]  Take 1 capsule (2,000 Units total) by mouth daily. Plotnikov, Georgina Quint, MD  Active   Levonorgestrel-Ethinyl Estradiol (AMETHIA) 0.15-0.03 &0.01 MG tablet [id number] No Take 1 tablet by mouth daily. Camrese [provider] Taking Active Self  pantoprazole (PROTONIX) 40 MG tablet [id number] No TAKE ONE tablet Orally twice a DAY 30 minutes BEFORE eating [provider] Taking Active             Home Care and Equipment/Supplies: Were Home Health Services Ordered?: NA Any new equipment or medical supplies ordered?: NA  Functional Questionnaire: Do you need assistance with bathing/showering or dressing?: No Do you need assistance with meal preparation?: No Do you need assistance with eating?: No Do you have difficulty maintaining continence: No Do you need assistance with getting out of bed/getting out of a chair/moving?: No Do you have difficulty managing or taking your medications?: No  Follow up appointments reviewed: PCP Follow-up appointment confirmed?: No (declined appt) Specialist Hospital Follow-up appointment confirmed?: NA Do you need transportation to your follow-up appointment?: No Do you understand care options if your condition(s) worsen?: Yes-patient verbalized understanding    SIGNATURE Karena Addison, LPN [facility] Nurse Health Advisor Direct Dial [phone]

## 2023-02-02 ENCOUNTER — Telehealth: Payer: Self-pay

## 2023-02-02 NOTE — Telephone Encounter (Signed)
No show for PV. Patient was called twice, no answer. Left message to call before 5:00 to reschedule PV. If patient doesn't call a no show letter will be mailed and procedure cancelled per [facility] guidelines.

## 2023-02-02 NOTE — Telephone Encounter (Signed)
Inbound call from patient regarding PV. Has been rescheduled for tomorrow 7/11 at 10:30. Requesting to call on work phone number. Please advise, thank you.

## 2023-02-03 ENCOUNTER — Ambulatory Visit (AMBULATORY_SURGERY_CENTER): Payer: BC Managed Care – PPO

## 2023-02-03 VITALS — Ht 66.0 in | Wt 140.0 lb

## 2023-02-03 DIAGNOSIS — Z1211 Encounter for screening for malignant neoplasm of colon: Secondary | ICD-10-CM

## 2023-02-03 DIAGNOSIS — N83202 Unspecified ovarian cyst, left side: Secondary | ICD-10-CM | POA: Diagnosis not present

## 2023-02-03 MED ORDER — PEG 3350-KCL-NA BICARB-NACL 420 G PO SOLR: 4000.0000 mL | Freq: Once | ORAL | 0 refills | Status: AC

## 2023-02-03 NOTE — Progress Notes (Signed)
No egg or soy allergy known to patient  No issues known to pt with past sedation with any surgeries or procedures Patient denies ever being told they had issues or difficulty with intubation  No FH of Malignant Hyperthermia Pt is not on diet pills Pt is not on  home 02  Pt is not on blood thinners  Pt states issues with chronic constipation  No A fib or A flutter Have any cardiac testing pending--no Patient's chart reviewed by Cathlyn Parsons CNRA prior to previsit and patient appropriate for the [facility].  Previsit completed and red dot placed by patient's name on their procedure day (on provider's schedule).    Ambulates independently Pt instructed to use Singlecare.com or GoodRx for a price reduction on prep

## 2023-02-06 ENCOUNTER — Encounter: Payer: Self-pay | Admitting: Gastroenterology

## 2023-02-21 ENCOUNTER — Encounter: Payer: Self-pay | Admitting: Gastroenterology

## 2023-02-21 ENCOUNTER — Ambulatory Visit (AMBULATORY_SURGERY_CENTER): Payer: BC Managed Care – PPO | Admitting: Gastroenterology

## 2023-02-21 VITALS — BP 97/55 | HR 82 | Temp 98.0°F | Resp 16 | Ht 66.0 in | Wt 140.0 lb

## 2023-02-21 DIAGNOSIS — D125 Benign neoplasm of sigmoid colon: Secondary | ICD-10-CM

## 2023-02-21 DIAGNOSIS — K635 Polyp of colon: Secondary | ICD-10-CM

## 2023-02-21 DIAGNOSIS — Z1211 Encounter for screening for malignant neoplasm of colon: Secondary | ICD-10-CM

## 2023-02-21 MED ORDER — SODIUM CHLORIDE 0.9 % IV SOLN: 500.0000 mL | Freq: Once | INTRAVENOUS | Status: DC

## 2023-02-21 NOTE — Progress Notes (Signed)
History & Physical  Primary Care Physician:  Tresa Garter, MD Primary Gastroenterologist: Claudette Head, MD  Impression / Plan:  Average risk CRC screening for colonoscopy.  CHIEF COMPLAINT:  CRC screening  HPI: [name] is a 47 y.o. female average risk CRC screening for colonoscopy.    Past Medical History:  Diagnosis Date   Allergy    Anxiety    Depression    Hypertension    LBP (low back pain) 07/26/2009   Vitamin B12 deficiency 07/26/2010   Vitamin D deficiency 07/26/2010    Past Surgical History:  Procedure Laterality Date   CESAREAN SECTION  1-08    Prior to Admission medications   Medication Sig Start Date End Date Taking? Authorizing Provider  ADDERALL XR 25 MG 24 hr capsule Take 25 mg by mouth daily at 12 noon. 01/24/19   [provider]  ADDERALL XR 30 MG 24 hr capsule Take 30 mg by mouth in the morning. 02/09/19   [provider]  ALPRAZolam Prudy Feeler) 0.5 MG tablet Take 0.5 mg by mouth at bedtime as needed for anxiety.    [provider]  amLODipine-olmesartan (AZOR) 10-40 MG tablet Take 1/2 tablet by mouth once a daily 08/23/22   Plotnikov, Georgina Quint, MD  amLODipine-olmesartan (AZOR) 5-20 MG tablet TAKE ONE TABLET BY MOUTH ONCE DAILY Annual appt due in Oct must see provider for future refills 04/16/22   Plotnikov, Georgina Quint, MD  B Complex-Folic Acid (B COMPLEX PLUS) TABS Take 1 tablet by mouth daily. Patient not taking: Reported on 02/03/2023 08/12/22   Plotnikov, Georgina Quint, MD  buPROPion (WELLBUTRIN XL) 300 MG 24 hr tablet Take 300 mg by mouth daily.    [provider]  Cholecalciferol (VITAMIN D3) 50 MCG (2000 UT) capsule Take 1 capsule (2,000 Units total) by mouth daily. 08/12/22   Plotnikov, Georgina Quint, MD  Levonorgestrel-Ethinyl Estradiol (AMETHIA) 0.15-0.03 &0.01 MG tablet Take 1 tablet by mouth daily. Camrese    [provider]  pantoprazole (PROTONIX) 40 MG tablet TAKE ONE tablet Orally twice a DAY 30  minutes BEFORE eating    [provider]    Current Outpatient Medications  Medication Sig Dispense Refill   ADDERALL XR 25 MG 24 hr capsule Take 25 mg by mouth daily at 12 noon.     ADDERALL XR 30 MG 24 hr capsule Take 30 mg by mouth in the morning.     ALPRAZolam (XANAX) 0.5 MG tablet Take 0.5 mg by mouth at bedtime as needed for anxiety.     amLODipine-olmesartan (AZOR) 10-40 MG tablet Take 1/2 tablet by mouth once a daily 45 tablet 3   amLODipine-olmesartan (AZOR) 5-20 MG tablet TAKE ONE TABLET BY MOUTH ONCE DAILY Annual appt due in Oct must see provider for future refills 90 tablet 0   B Complex-Folic Acid (B COMPLEX PLUS) TABS Take 1 tablet by mouth daily. (Patient not taking: Reported on 02/03/2023) 100 tablet 3   buPROPion (WELLBUTRIN XL) 300 MG 24 hr tablet Take 300 mg by mouth daily.     Cholecalciferol (VITAMIN D3) 50 MCG (2000 UT) capsule Take 1 capsule (2,000 Units total) by mouth daily. 100 capsule 3   Levonorgestrel-Ethinyl Estradiol (AMETHIA) 0.15-0.03 &0.01 MG tablet Take 1 tablet by mouth daily. Camrese     pantoprazole (PROTONIX) 40 MG tablet TAKE ONE tablet Orally twice a DAY 30 minutes BEFORE eating     No current facility-administered medications for this visit.    Allergies as of 02/21/2023 -  Review Complete 02/03/2023  Allergen Reaction Noted   Strattera [atomoxetine hcl]  05/08/2015    Family History  Problem Relation Age of Onset   Cancer Mother        breast cancer   Kidney disease Other     Social History   Socioeconomic History   Marital status: Married    Spouse name: Not on file   Number of children: Not on file   Years of education: Not on file   Highest education level: Not on file  Occupational History   Not on file  Tobacco Use   Smoking status: Former    Current packs/day: 0.30    Average packs/day: 0.3 packs/day for 10.0 years (3.0 ttl pk-yrs)    Types: Cigarettes   Smokeless tobacco: Never  Vaping Use   Vaping status: Never  Used  Substance and Sexual Activity   Alcohol use: Not Currently    Comment: occasionally/minimally   Drug use: Yes    Comment: reports occasional delta-8 use   Sexual activity: Yes  Other Topics Concern   Not on file  Social History Narrative   Not on file   Social Determinants of Health   Financial Resource Strain: Not on file  Food Insecurity: Not on file  Transportation Needs: Not on file  Physical Activity: Not on file  Stress: Not on file  Social Connections: Not on file  Intimate Partner Violence: Not on file    Review of Systems:  All systems reviewed were negative except where noted in HPI.   Physical Exam:  General:  Alert, well-developed, in NAD Head:  Normocephalic and atraumatic. Eyes:  Sclera clear, no icterus.   Conjunctiva pink. Ears:  Normal auditory acuity. Mouth:  No deformity or lesions.  Neck:  Supple; no masses. Lungs:  Clear throughout to auscultation.   No wheezes, crackles, or rhonchi.  Heart:  Regular rate and rhythm; no murmurs. Abdomen:  Soft, nondistended, nontender. No masses, hepatomegaly. No palpable masses.  Normal bowel sounds.    Rectal:  Deferred   Msk:  Symmetrical without gross deformities. Extremities:  Without edema. Neurologic:  Alert and  oriented x 4; grossly normal neurologically. Skin:  Intact without significant lesions or rashes. Psych:  Alert and cooperative. Normal mood and affect.   Venita Lick. Russella Dar  02/21/2023, 2:59 PM See Loretha Stapler, [facility], to contact our on call provider

## 2023-02-21 NOTE — Progress Notes (Signed)
Called to room to assist during endoscopic procedure.  Patient ID and intended procedure confirmed with present staff. Received instructions for my participation in the procedure from the performing physician.  

## 2023-02-21 NOTE — Op Note (Signed)
[facility] Patient Name: Jenissa Gayda Procedure Date: 02/21/2023 3:22 PM MRN: [id number] Endoscopist: Meryl Dare , MD, [phone] Age: 47 Referring MD:  Date of Birth: 15-Jun-1976 Gender: Female Account #: [id number] Procedure:                Colonoscopy Indications:              Screening for colorectal malignant neoplasm Medicines:                Monitored Anesthesia Care Procedure:                Pre-Anesthesia Assessment:                           - Prior to the procedure, a History and Physical                            was performed, and patient medications and                            allergies were reviewed. The patient's tolerance of                            previous anesthesia was also reviewed. The risks                            and benefits of the procedure and the sedation                            options and risks were discussed with the patient.                            All questions were answered, and informed consent                            was obtained. Prior Anticoagulants: The patient has                            taken no anticoagulant or antiplatelet agents. ASA                            Grade Assessment: II - A patient with mild systemic                            disease. After reviewing the risks and benefits,                            the patient was deemed in satisfactory condition to                            undergo the procedure.                           After obtaining informed consent, the colonoscope  was passed under direct vision. Throughout the                            procedure, the patient's blood pressure, pulse, and                            oxygen saturations were monitored continuously. The                            Olympus PCF-[id number] (#[id number]) Colonoscope was                            introduced through the anus and advanced to the the                            cecum,  identified by appendiceal orifice and                            ileocecal valve. The ileocecal valve, appendiceal                            orifice, and rectum were photographed. The quality                            of the bowel preparation was good. The colonoscopy                            was performed without difficulty. The patient                            tolerated the procedure well. Scope In: 3:25:27 PM Scope Out: 3:44:22 PM Total Procedure Duration: 0 hours 18 minutes 55 seconds  Findings:                 The perianal and digital rectal examinations were                            normal.                           A 6 mm polyp was found in the sigmoid colon. The                            polyp was sessile. The polyp was removed with a                            cold snare. Resection and retrieval were complete.                           Internal hemorrhoids were found during                            retroflexion. The hemorrhoids were small and Grade  I (internal hemorrhoids that do not prolapse).                           The exam was otherwise without abnormality on                            direct and retroflexion views. Complications:            No immediate complications. Estimated blood loss:                            None. Estimated Blood Loss:     Estimated blood loss: none. Impression:               - One 6 mm polyp in the sigmoid colon, removed with                            a cold snare. Resected and retrieved.                           - Internal hemorrhoids.                           - The examination was otherwise normal on direct                            and retroflexion views. Recommendation:           - Repeat colonoscopy after studies are complete for                            surveillance based on pathology results.                           - Patient has a contact number available for                             emergencies. The signs and symptoms of potential                            delayed complications were discussed with the                            patient. Return to normal activities tomorrow.                            Written discharge instructions were provided to the                            patient.                           - Resume previous diet.                           - Continue present medications.                           -  Await pathology results. Meryl Dare, MD 02/21/2023 3:47:49 PM This report has been signed electronically.

## 2023-02-21 NOTE — Patient Instructions (Signed)

## 2023-02-21 NOTE — Progress Notes (Signed)
Vss nad trans to pacu 

## 2023-02-22 ENCOUNTER — Telehealth: Payer: Self-pay

## 2023-02-22 NOTE — Telephone Encounter (Signed)
Left message on answering machine. 

## 2023-03-04 ENCOUNTER — Encounter: Payer: Self-pay | Admitting: Gastroenterology

## 2023-03-21 DIAGNOSIS — N83202 Unspecified ovarian cyst, left side: Secondary | ICD-10-CM | POA: Diagnosis not present

## 2023-04-11 DIAGNOSIS — H524 Presbyopia: Secondary | ICD-10-CM | POA: Diagnosis not present

## 2023-04-11 DIAGNOSIS — H5213 Myopia, bilateral: Secondary | ICD-10-CM | POA: Diagnosis not present

## 2023-04-25 DIAGNOSIS — F902 Attention-deficit hyperactivity disorder, combined type: Secondary | ICD-10-CM | POA: Diagnosis not present

## 2023-04-25 DIAGNOSIS — Z79899 Other long term (current) drug therapy: Secondary | ICD-10-CM | POA: Diagnosis not present

## 2023-04-26 DIAGNOSIS — F902 Attention-deficit hyperactivity disorder, combined type: Secondary | ICD-10-CM | POA: Diagnosis not present

## 2023-07-12 ENCOUNTER — Other Ambulatory Visit: Payer: Self-pay | Admitting: Internal Medicine

## 2023-07-14 DIAGNOSIS — Z01419 Encounter for gynecological examination (general) (routine) without abnormal findings: Secondary | ICD-10-CM | POA: Diagnosis not present

## 2023-07-14 DIAGNOSIS — Z6823 Body mass index (BMI) 23.0-23.9, adult: Secondary | ICD-10-CM | POA: Diagnosis not present

## 2023-07-14 DIAGNOSIS — Z124 Encounter for screening for malignant neoplasm of cervix: Secondary | ICD-10-CM | POA: Diagnosis not present

## 2023-07-14 DIAGNOSIS — Z1231 Encounter for screening mammogram for malignant neoplasm of breast: Secondary | ICD-10-CM | POA: Diagnosis not present

## 2023-08-08 ENCOUNTER — Other Ambulatory Visit: Payer: Self-pay | Admitting: Obstetrics and Gynecology

## 2023-08-08 DIAGNOSIS — D4959 Neoplasm of unspecified behavior of other genitourinary organ: Secondary | ICD-10-CM | POA: Diagnosis not present

## 2023-08-08 DIAGNOSIS — N83292 Other ovarian cyst, left side: Secondary | ICD-10-CM | POA: Diagnosis not present

## 2023-08-08 DIAGNOSIS — C562 Malignant neoplasm of left ovary: Secondary | ICD-10-CM | POA: Diagnosis not present

## 2023-08-08 DIAGNOSIS — N83202 Unspecified ovarian cyst, left side: Secondary | ICD-10-CM | POA: Diagnosis not present

## 2023-08-08 DIAGNOSIS — D4989 Neoplasm of unspecified behavior of other specified sites: Secondary | ICD-10-CM | POA: Diagnosis not present

## 2023-08-08 DIAGNOSIS — D3912 Neoplasm of uncertain behavior of left ovary: Secondary | ICD-10-CM | POA: Diagnosis not present

## 2023-08-09 LAB — CYTOLOGY - NON PAP

## 2023-08-17 LAB — SURGICAL PATHOLOGY

## 2023-08-22 DIAGNOSIS — D3912 Neoplasm of uncertain behavior of left ovary: Secondary | ICD-10-CM | POA: Diagnosis not present

## 2023-09-23 DIAGNOSIS — D3912 Neoplasm of uncertain behavior of left ovary: Secondary | ICD-10-CM | POA: Diagnosis not present

## 2023-09-26 ENCOUNTER — Encounter (HOSPITAL_BASED_OUTPATIENT_CLINIC_OR_DEPARTMENT_OTHER): Payer: Self-pay | Admitting: Gynecologic Oncology

## 2023-09-26 DIAGNOSIS — D3912 Neoplasm of uncertain behavior of left ovary: Secondary | ICD-10-CM

## 2023-09-27 ENCOUNTER — Other Ambulatory Visit (HOSPITAL_BASED_OUTPATIENT_CLINIC_OR_DEPARTMENT_OTHER): Payer: Self-pay | Admitting: Gynecologic Oncology

## 2023-09-27 DIAGNOSIS — D3912 Neoplasm of uncertain behavior of left ovary: Secondary | ICD-10-CM

## 2023-09-29 ENCOUNTER — Ambulatory Visit (HOSPITAL_BASED_OUTPATIENT_CLINIC_OR_DEPARTMENT_OTHER)
Admission: RE | Admit: 2023-09-29 | Discharge: 2023-09-29 | Disposition: A | Discharge status: Home / Self Care | Source: Ambulatory Visit | Attending: Gynecologic Oncology | Admitting: Gynecologic Oncology

## 2023-09-29 DIAGNOSIS — D3912 Neoplasm of uncertain behavior of left ovary: Secondary | ICD-10-CM | POA: Insufficient documentation

## 2023-09-29 DIAGNOSIS — C569 Malignant neoplasm of unspecified ovary: Secondary | ICD-10-CM | POA: Diagnosis not present

## 2023-09-29 MED ORDER — IOHEXOL 300 MG/ML  SOLN
100.0000 mL | Freq: Once | INTRAMUSCULAR | Status: AC | PRN
  Administered 2023-09-29: 85 mL via INTRAVENOUS

## 2023-10-04 DIAGNOSIS — R140 Abdominal distension (gaseous): Secondary | ICD-10-CM | POA: Diagnosis not present

## 2023-10-04 DIAGNOSIS — Z90721 Acquired absence of ovaries, unilateral: Secondary | ICD-10-CM | POA: Diagnosis not present

## 2023-10-04 DIAGNOSIS — C561 Malignant neoplasm of right ovary: Secondary | ICD-10-CM | POA: Diagnosis not present

## 2023-10-04 DIAGNOSIS — R102 Pelvic and perineal pain: Secondary | ICD-10-CM | POA: Diagnosis not present

## 2023-10-04 DIAGNOSIS — D3911 Neoplasm of uncertain behavior of right ovary: Secondary | ICD-10-CM | POA: Diagnosis not present

## 2023-10-04 DIAGNOSIS — N838 Other noninflammatory disorders of ovary, fallopian tube and broad ligament: Secondary | ICD-10-CM | POA: Diagnosis not present

## 2023-10-04 DIAGNOSIS — D3910 Neoplasm of uncertain behavior of unspecified ovary: Secondary | ICD-10-CM | POA: Diagnosis not present

## 2023-10-04 DIAGNOSIS — I10 Essential (primary) hypertension: Secondary | ICD-10-CM | POA: Diagnosis not present

## 2023-10-04 DIAGNOSIS — Z9079 Acquired absence of other genital organ(s): Secondary | ICD-10-CM | POA: Diagnosis not present

## 2023-10-04 DIAGNOSIS — F909 Attention-deficit hyperactivity disorder, unspecified type: Secondary | ICD-10-CM | POA: Diagnosis not present

## 2023-10-04 DIAGNOSIS — R8569 Abnormal cytological findings in specimens from other digestive organs and abdominal cavity: Secondary | ICD-10-CM | POA: Diagnosis not present

## 2023-10-04 DIAGNOSIS — D3912 Neoplasm of uncertain behavior of left ovary: Secondary | ICD-10-CM | POA: Diagnosis not present

## 2023-10-04 DIAGNOSIS — Z79899 Other long term (current) drug therapy: Secondary | ICD-10-CM | POA: Diagnosis not present

## 2023-10-19 ENCOUNTER — Encounter: Payer: Self-pay | Admitting: Emergency Medicine

## 2023-10-19 ENCOUNTER — Ambulatory Visit (INDEPENDENT_AMBULATORY_CARE_PROVIDER_SITE_OTHER): Admitting: Emergency Medicine

## 2023-10-19 ENCOUNTER — Ambulatory Visit: Payer: Self-pay

## 2023-10-19 VITALS — BP 96/68 | HR 92 | Temp 98.2°F | Ht 66.0 in | Wt 148.0 lb

## 2023-10-19 DIAGNOSIS — R6889 Other general symptoms and signs: Secondary | ICD-10-CM | POA: Diagnosis not present

## 2023-10-19 DIAGNOSIS — J988 Other specified respiratory disorders: Secondary | ICD-10-CM | POA: Diagnosis not present

## 2023-10-19 DIAGNOSIS — B9789 Other viral agents as the cause of diseases classified elsewhere: Secondary | ICD-10-CM | POA: Insufficient documentation

## 2023-10-19 DIAGNOSIS — R051 Acute cough: Secondary | ICD-10-CM | POA: Diagnosis not present

## 2023-10-19 LAB — POCT INFLUENZA A/B
Influenza A, POC: NEGATIVE
Influenza B, POC: NEGATIVE

## 2023-10-19 LAB — POC COVID19 BINAXNOW: SARS Coronavirus 2 Ag: NEGATIVE

## 2023-10-19 MED ORDER — HYDROCODONE BIT-HOMATROP MBR 5-1.5 MG/5ML PO SOLN
5.0000 mL | Freq: Four times a day (QID) | ORAL | 0 refills | Status: AC | PRN
Start: 2023-10-19 — End: ?

## 2023-10-19 MED ORDER — BENZONATATE 200 MG PO CAPS
200.0000 mg | ORAL_CAPSULE | Freq: Two times a day (BID) | ORAL | 0 refills | Status: AC | PRN
Start: 2023-10-19 — End: ?

## 2023-10-19 NOTE — Assessment & Plan Note (Signed)
 Symptom management discussed Pain management discussed Recommend to take Tylenol and or Advil every 4-6 hours as needed for headaches and bodyaches. Recommend to rest and stay well-hydrated.

## 2023-10-19 NOTE — Patient Instructions (Signed)

## 2023-10-19 NOTE — Assessment & Plan Note (Signed)
 Clinically stable.  Running its course without complications. Negative for both COVID and flu. Symptom management discussed. Recommend to rest and stay well-hydrated Work note provided.  Should be off until next Monday. ED precautions given. Advised to contact the office if no better or worse during the next several days.

## 2023-10-19 NOTE — Telephone Encounter (Signed)
 Copied from CRM [id number]. Topic: Clinical - Red Word Triage >> Oct 19, 2023  7:51 AM Pascal Lux wrote: Red Word that prompted transfer to Nurse Triage: Patient stated she's been coughing all night and previously had a hysterectomy  Chief Complaint: cough Symptoms: cough Frequency: started Sunday Pertinent Negatives: Patient denies cp, sob Disposition: [] ED /[] Urgent Care (no appt availability in office) / [x] Appointment(In office/virtual)/ []  [facility]/ [] Home Care/ [] Refused Recommended Disposition /[]  Mobile Bus/ []  Follow-up with PCP Additional Notes: per protocol apt made for this am; care advice given, denies questions; instructed to go to ER if becomes worse.   Reason for Disposition  [1] Continuous (nonstop) coughing interferes with work or school AND [2] no improvement using cough treatment per Care Advice  Answer Assessment - Initial Assessment Questions 1. ONSET: "When did the cough begin?"      Sunday 2. SEVERITY: "How bad is the cough today?"      moderate 3. SPUTUM: "Describe the color of your sputum" (none, dry cough; clear, white, yellow, green)     Non productive 4. HEMOPTYSIS: "Are you coughing up any blood?" If so ask: "How much?" (flecks, streaks, tablespoons, etc.)     denies 5. DIFFICULTY BREATHING: "Are you having difficulty breathing?" If Yes, ask: "How bad is it?" (e.g., mild, moderate, severe)    - MILD: No SOB at rest, mild SOB with walking, speaks normally in sentences, can lie down, no retractions, pulse < 100.    - MODERATE: SOB at rest, SOB with minimal exertion and prefers to sit, cannot lie down flat, speaks in phrases, mild retractions, audible wheezing, pulse 100-120.    - SEVERE: Very SOB at rest, speaks in single words, struggling to breathe, sitting hunched forward, retractions, pulse > 120      denies 6. FEVER: "Do you have a fever?" If Yes, ask: "What is your temperature, how was it measured, and when did it start?"      denies 7. CARDIAC HISTORY: "Do you have any history of heart disease?" (e.g., heart attack, congestive heart failure)      no 8. LUNG HISTORY: "Do you have any history of lung disease?"  (e.g., pulmonary embolus, asthma, emphysema)     no 9. PE RISK FACTORS: "Do you have a history of blood clots?" (or: recent major surgery, recent prolonged travel, bedridden)     no 10. OTHER SYMPTOMS: "Do you have any other symptoms?" (e.g., runny nose, wheezing, chest pain)       denies 11. PREGNANCY: "Is there any chance you are pregnant?" "When was your last menstrual period?"       na 12. TRAVEL: "Have you traveled out of the country in the last month?" (e.g., travel history, exposures)       na  Protocols used: Cough - Acute Productive-A-AH

## 2023-10-19 NOTE — Assessment & Plan Note (Signed)
 Cough management discussed Advised to rest and stay well-hydrated Recommend to continue over-the-counter Mucinex DM and cough drops. Recommend to start Tessalon 200 mg 3 times a day and Hycodan syrup every 6 hours as needed.

## 2023-10-19 NOTE — Progress Notes (Signed)
 [name] 48 y.o.   Chief Complaint  Patient presents with   Cough    Patient states it started Sunday. She states slight sore throat, nauseas, fatigue. she says it hurts so bad to cough. She has only been taking tylenol and ibuprofen and dylsum at night    HISTORY OF PRESENT ILLNESS: Acute problem visit today.  Patient of Dr. Trinna Post Plotnikov. This is a 48 y.o. female complaining of flulike symptoms that started last Sunday 3 days ago. Complaining of sore throat, nausea, fatigue and cough.  Mostly complaining of cough. Recently coworkers were also sick with viral respiratory infection and similar symptoms. Status post laparoscopic hysterectomy 2 weeks ago  Cough Associated symptoms include a sore throat. Pertinent negatives include no chest pain, ear pain or rash.     Prior to Admission medications   Medication Sig Start Date End Date Taking? Authorizing Provider  ADDERALL XR 25 MG 24 hr capsule Take 25 mg by mouth daily at 12 noon. 01/24/19   [provider]  ADDERALL XR 30 MG 24 hr capsule Take 30 mg by mouth in the morning. 02/09/19   [provider]  ALPRAZolam Prudy Feeler) 0.5 MG tablet Take 0.5 mg by mouth at bedtime as needed for anxiety.    [provider]  amLODipine-olmesartan (AZOR) 5-20 MG tablet TAKE ONE TABLET BY MOUTH ONCE DAILY Annual appt due in Oct must see provider for future refills 04/16/22   Plotnikov, Georgina Quint, MD  amLODipine-olmesartan (AZOR) 5-20 MG tablet TAKE ONE TABLET BY MOUTH ONCE DAILY. 07/13/23   Plotnikov, Georgina Quint, MD  B Complex-Folic Acid (B COMPLEX PLUS) TABS Take 1 tablet by mouth daily. Patient not taking: Reported on 02/03/2023 08/12/22   Plotnikov, Georgina Quint, MD  buPROPion (WELLBUTRIN XL) 300 MG 24 hr tablet Take 300 mg by mouth daily.    [provider]  Cholecalciferol (VITAMIN D3) 50 MCG (2000 UT) capsule Take 1 capsule (2,000 Units total) by mouth daily. 08/12/22   Plotnikov, Georgina Quint, MD   Levonorgestrel-Ethinyl Estradiol (AMETHIA) 0.15-0.03 &0.01 MG tablet Take 1 tablet by mouth daily. 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   Nonallopathic lesion of cervical region 04/11/2013   Nonallopathic lesion of lumbosacral region 04/11/2013   Nonallopathic lesion of thoracic region 04/11/2013   Vitamin B 12 deficiency 12/12/2010   Vitamin D deficiency 12/12/2010   Depression 12/12/2010   Pain in thoracic spine 09/01/2010    Past Medical History:  Diagnosis Date   Allergy    Anxiety    Depression    Hypertension    LBP (low back pain) 07/26/2009   Vitamin B12 deficiency 07/26/2010   Vitamin D deficiency 07/26/2010    Past Surgical History:  Procedure Laterality Date   CESAREAN SECTION  1-08    Social History   Socioeconomic History   Marital status: Married    Spouse name: Not on file   Number of children: Not on  file   Years of education: Not on file   Highest education level: Not on file  Occupational History   Not on file  Tobacco Use   Smoking status: Former    Current packs/day: 0.30    Average packs/day: 0.3 packs/day for 10.0 years (3.0 ttl pk-yrs)    Types: Cigarettes   Smokeless tobacco: Never  Vaping Use   Vaping status: Never Used  Substance and Sexual Activity   Alcohol use: Not Currently    Comment: occasionally/minimally   Drug use: Yes    Comment: reports occasional delta-8 use   Sexual activity: Yes  Other Topics  Concern   Not on file  Social History Narrative   Not on file   Social Drivers of Health   Financial Resource Strain: Not on file  Food Insecurity: Not on file  Transportation Needs: Not on file  Physical Activity: Not on file  Stress: Not on file  Social Connections: Not on file  Intimate Partner Violence: Not on file    Family History  Problem Relation Age of Onset   Cancer Mother        breast cancer   Kidney disease Other      Review of Systems  Constitutional:  Positive for malaise/fatigue.  HENT:  Positive for congestion and sore throat. Negative for ear pain.   Respiratory:  Positive for cough.   Cardiovascular: Negative.  Negative for chest pain and palpitations.  Gastrointestinal:  Positive for nausea. Negative for diarrhea and vomiting.  Genitourinary: Negative.  Negative for dysuria and hematuria.  Skin:  Negative for rash.  Neurological:  Negative for dizziness.    Today's Vitals   10/19/23 0825  BP: 96/68  Pulse: 92  Temp: 98.2 F (36.8 C)  TempSrc: Oral  SpO2: 99%  Weight: 148 lb (67.1 kg)  Height: 5\' 6"  (1.676 m)   Body mass index is 23.89 kg/m.   Physical Exam Vitals reviewed.  Constitutional:      Appearance: Normal appearance.  HENT:     Head: Normocephalic.     Mouth/Throat:     Mouth: Mucous membranes are moist.     Pharynx: Oropharynx is clear.  Eyes:     Extraocular Movements: Extraocular movements intact.     Pupils: Pupils are equal, round, and reactive to light.  Cardiovascular:     Rate and Rhythm: Normal rate and regular rhythm.     Pulses: Normal pulses.     Heart sounds: Normal heart sounds.  Pulmonary:     Effort: Pulmonary effort is normal.     Breath sounds: Normal breath sounds.  Musculoskeletal:     Cervical back: No tenderness.  Lymphadenopathy:     Cervical: No cervical adenopathy.  Skin:    General: Skin is warm and dry.  Neurological:     Mental Status: She is alert and oriented to person, place, and  time.  Psychiatric:        Mood and Affect: Mood normal.        Behavior: Behavior normal.    Results for orders placed or performed in visit on 10/19/23 (from the past 24 hours)  POC COVID-19     Status: None   Collection Time: 10/19/23  9:30 AM  Result Value Ref Range   SARS Coronavirus 2 Ag Negative Negative  POCT Influenza A/B     Status: None   Collection Time: 10/19/23  9:30 AM  Result Value Ref Range   Influenza A, POC Negative Negative  Influenza B, POC Negative Negative     ASSESSMENT & PLAN: A total of 32 minutes was spent with the patient and counseling/coordination of care regarding preparing for this visit, review of most recent office visit notes, review of chronic medical conditions under management, review of all medications, diagnosis of viral respiratory infection and prognosis, symptom management, documentation, and need for follow-up if no better or worse during the next several days.  Problem List Items Addressed This Visit       Respiratory   Viral respiratory infection - Primary   Clinically stable.  Running its course without complications. Negative for both COVID and flu. Symptom management discussed. Recommend to rest and stay well-hydrated Work note provided.  Should be off until next Monday. ED precautions given. Advised to contact the office if no better or worse during the next several days.        Other   Acute cough   Cough management discussed Advised to rest and stay well-hydrated Recommend to continue over-the-counter Mucinex DM and cough drops. Recommend to start Tessalon 200 mg 3 times a day and Hycodan syrup every 6 hours as needed.       Relevant Medications   benzonatate (TESSALON) 200 MG capsule   HYDROcodone bit-homatropine (HYCODAN) 5-1.5 MG/5ML syrup   Flu-like symptoms   Symptom management discussed Pain management discussed Recommend to take Tylenol and or Advil every 4-6 hours as needed for headaches and  bodyaches. Recommend to rest and stay well-hydrated.      Patient Instructions  Viral Respiratory Infection A viral respiratory infection is an illness that affects parts of the body that are used for breathing. These include the lungs, nose, and throat. It is caused by a germ called a virus. Some examples of this kind of infection are: A cold. The flu (influenza). A respiratory syncytial virus (RSV) infection. What are the causes? This condition is caused by a virus. It spreads from person to person. You can get the virus if: You breathe in droplets from someone who is sick. You come in contact with people who are sick. You touch mucus or other fluid from a person who is sick. What are the signs or symptoms? Symptoms of this condition include: A stuffy or runny nose. A sore throat. A cough. Shortness of breath. Trouble breathing. Yellow or green fluid in the nose. Other symptoms may include: A fever. Sweating or chills. Tiredness (fatigue). Achy muscles. A headache. How is this treated? This condition may be treated with: Medicines that treat viruses. Medicines that make it easy to breathe. Medicines that are sprayed into the nose. Acetaminophen or NSAIDs, such as ibuprofen, to treat fever. Follow these instructions at home: Managing pain and congestion Take over-the-counter and prescription medicines only as told by your doctor. If you have a sore throat, gargle with salt water. Do this 3-4 times a day or as needed. To make salt water, dissolve -1 tsp (3-6 g) of salt in 1 cup (237 mL) of warm water. Make sure that all the salt dissolves. Use nose drops made from salt water. This helps with stuffiness (congestion). It also helps soften the skin around your nose. Take 2 tsp (10 mL) of honey at bedtime to lessen coughing at night. Do not give honey to children who are younger than 44 year old. Drink enough fluid to keep your pee (urine) pale yellow. General  instructions  Rest as much as possible. Do not drink alcohol. Do not smoke or use any products  that contain nicotine or tobacco. If you need help quitting, ask your doctor. Keep all follow-up visits. How is this prevented?     Get a flu shot every year. Ask your doctor when you should get your flu shot. Do not let other people get your germs. If you are sick: Wash your hands with soap and water often. Wash your hands after you cough or sneeze. Wash hands for at least 20 seconds. If you cannot use soap and water, use hand sanitizer. Cover your mouth when you cough. Cover your nose and mouth when you sneeze. Do not share cups or eating utensils. Clean commonly used objects often. Clean commonly touched surfaces. Stay home from work or school. Avoid contact with people who are sick during cold and flu season. This is in fall and winter. Get help if: Your symptoms last for 10 days or longer. Your symptoms get worse over time. You have very bad pain in your face or forehead. Parts of your jaw or neck get very swollen. You have shortness of breath. Get help right away if: You feel pain or pressure in your chest. You have trouble breathing. You faint or feel like you will faint. You keep vomiting and it gets worse. You feel confused. These symptoms may be an emergency. Get help right away. Call your local emergency services (911 in the [place] Do not wait to see if the symptoms will go away. Do not drive yourself to the hospital. Summary A viral respiratory infection is an illness that affects parts of the body that are used for breathing. Examples of this illness include a cold, the flu, and a respiratory syncytial virus (RSV) infection. The infection can cause a runny nose, cough, sore throat, and fever. Follow what your doctor tells you about taking medicines, drinking lots of fluid, washing your hands, resting at home, and avoiding people who are sick. This information is not  intended to replace advice given to you by your health care provider. Make sure you discuss any questions you have with your health care provider. Document Revised: 10/16/2020 Document Reviewed: 10/16/2020 Elsevier Patient Education  2024 Elsevier Inc.   Edwina Barth, MD [facility]

## 2023-10-24 DIAGNOSIS — F902 Attention-deficit hyperactivity disorder, combined type: Secondary | ICD-10-CM | POA: Diagnosis not present

## 2023-10-24 DIAGNOSIS — Z79899 Other long term (current) drug therapy: Secondary | ICD-10-CM | POA: Diagnosis not present

## 2023-11-01 ENCOUNTER — Encounter: Payer: Self-pay | Admitting: Emergency Medicine

## 2023-11-11 DIAGNOSIS — C569 Malignant neoplasm of unspecified ovary: Secondary | ICD-10-CM | POA: Diagnosis not present

## 2023-11-11 DIAGNOSIS — Z7189 Other specified counseling: Secondary | ICD-10-CM | POA: Diagnosis not present

## 2023-12-26 DIAGNOSIS — C569 Malignant neoplasm of unspecified ovary: Secondary | ICD-10-CM | POA: Diagnosis not present

## 2023-12-26 DIAGNOSIS — R634 Abnormal weight loss: Secondary | ICD-10-CM | POA: Diagnosis not present

## 2023-12-26 DIAGNOSIS — K635 Polyp of colon: Secondary | ICD-10-CM | POA: Diagnosis not present

## 2023-12-26 DIAGNOSIS — R112 Nausea with vomiting, unspecified: Secondary | ICD-10-CM | POA: Diagnosis not present

## 2023-12-26 DIAGNOSIS — K921 Melena: Secondary | ICD-10-CM | POA: Diagnosis not present

## 2023-12-26 DIAGNOSIS — R1084 Generalized abdominal pain: Secondary | ICD-10-CM | POA: Diagnosis not present

## 2023-12-26 DIAGNOSIS — R5383 Other fatigue: Secondary | ICD-10-CM | POA: Diagnosis not present

## 2023-12-26 DIAGNOSIS — R6881 Early satiety: Secondary | ICD-10-CM | POA: Diagnosis not present

## 2024-03-27 ENCOUNTER — Encounter: Payer: Self-pay | Admitting: Internal Medicine

## 2024-05-02 DIAGNOSIS — F902 Attention-deficit hyperactivity disorder, combined type: Secondary | ICD-10-CM | POA: Diagnosis not present

## 2024-05-02 DIAGNOSIS — Z79899 Other long term (current) drug therapy: Secondary | ICD-10-CM | POA: Diagnosis not present

## 2024-06-08 ENCOUNTER — Other Ambulatory Visit: Payer: Self-pay | Admitting: Internal Medicine

## 2024-06-08 MED ORDER — AMLODIPINE-OLMESARTAN 5-20 MG PO TABS: 1.0000 | ORAL_TABLET | Freq: Every day | ORAL | 1 refills | Status: AC

## 2024-06-08 NOTE — Telephone Encounter (Signed)
 Copied from CRM #[id number]. Topic: Clinical - Medication Refill >> Jun 08, 2024 10:29 AM Tinnie C wrote: Medication: amLODipine -olmesartan  (AZOR ) 5-20 MG tablet  Has the patient contacted their pharmacy? No (Agent: If no, request that the patient contact the pharmacy for the refill. If patient does not wish to contact the pharmacy document the reason why and proceed with request.) (Agent: If yes, when and what did the pharmacy advise?)  This is the patient's preferred pharmacy:  [facility] [place], [place] - [address] [facility] [address] [place] [address] Phone: [phone] Fax: [phone]  Is this the correct pharmacy for this prescription? Yes If no, delete pharmacy and type the correct one.   Has the prescription been filled recently? Yes  Is the patient out of the medication? Yes  Has the patient been seen for an appointment in the last year OR does the patient have an upcoming appointment? Yes  Can we respond through MyChart? Yes  Agent: Please be advised that Rx refills may take up to 3 business days. We ask that you follow-up with your pharmacy.

## 2024-06-20 ENCOUNTER — Ambulatory Visit: Admitting: Internal Medicine

## 2024-06-20 ENCOUNTER — Encounter: Payer: Self-pay | Admitting: Internal Medicine

## 2024-06-20 VITALS — BP 106/68 | HR 96 | Temp 97.8°F | Ht 66.0 in | Wt 139.8 lb

## 2024-06-20 DIAGNOSIS — M5442 Lumbago with sciatica, left side: Secondary | ICD-10-CM

## 2024-06-20 DIAGNOSIS — E559 Vitamin D deficiency, unspecified: Secondary | ICD-10-CM | POA: Diagnosis not present

## 2024-06-20 DIAGNOSIS — E538 Deficiency of other specified B group vitamins: Secondary | ICD-10-CM

## 2024-06-20 DIAGNOSIS — Z0000 Encounter for general adult medical examination without abnormal findings: Secondary | ICD-10-CM | POA: Diagnosis not present

## 2024-06-20 DIAGNOSIS — F329 Major depressive disorder, single episode, unspecified: Secondary | ICD-10-CM

## 2024-06-20 DIAGNOSIS — M5441 Lumbago with sciatica, right side: Secondary | ICD-10-CM | POA: Diagnosis not present

## 2024-06-20 LAB — COMPREHENSIVE METABOLIC PANEL WITH GFR
ALT: 11 U/L (ref 0–35)
AST: 15 U/L (ref 0–37)
Albumin: 4.6 g/dL (ref 3.5–5.2)
Alkaline Phosphatase: 38 U/L — ABNORMAL LOW (ref 39–117)
BUN: 24 mg/dL — ABNORMAL HIGH (ref 6–23)
CO2: 26 meq/L (ref 19–32)
Calcium: 9.1 mg/dL (ref 8.4–10.5)
Chloride: 104 meq/L (ref 96–112)
Creatinine, Ser: 0.97 mg/dL (ref 0.40–1.20)
GFR: 69.4 mL/min (ref >60.00)
Glucose, Bld: 82 mg/dL (ref 70–99)
Potassium: 4 meq/L (ref 3.5–5.1)
Sodium: 138 meq/L (ref 135–145)
Total Bilirubin: 0.5 mg/dL (ref 0.2–1.2)
Total Protein: 6.9 g/dL (ref 6.0–8.3)

## 2024-06-20 LAB — CBC WITH DIFFERENTIAL/PLATELET
Basophils Absolute: 0 K/uL (ref 0.0–0.1)
Basophils Relative: 0.4 % (ref 0.0–3.0)
Eosinophils Absolute: 0.1 K/uL (ref 0.0–0.7)
Eosinophils Relative: 0.9 % (ref 0.0–5.0)
HCT: 37.7 % (ref 36.0–46.0)
Hemoglobin: 12.5 g/dL (ref 12.0–15.0)
Lymphocytes Relative: 17.6 % (ref 12.0–46.0)
Lymphs Abs: 1.1 K/uL (ref 0.7–4.0)
MCHC: 33.3 g/dL (ref 30.0–36.0)
MCV: 90 fl (ref 78.0–100.0)
Monocytes Absolute: 0.5 K/uL (ref 0.1–1.0)
Monocytes Relative: 8.3 % (ref 3.0–12.0)
Neutro Abs: 4.6 K/uL (ref 1.4–7.7)
Neutrophils Relative %: 72.8 % (ref 43.0–77.0)
Platelets: 310 K/uL (ref 150.0–400.0)
RBC: 4.19 Mil/uL (ref 3.87–5.11)
RDW: 14.2 % (ref 11.5–15.5)
WBC: 6.3 K/uL (ref 4.0–10.5)

## 2024-06-20 LAB — URINALYSIS
Bilirubin Urine: NEGATIVE
Hgb urine dipstick: NEGATIVE
Ketones, ur: NEGATIVE
Leukocytes,Ua: NEGATIVE
Nitrite: NEGATIVE
Specific Gravity, Urine: 1.025 (ref 1.000–1.030)
Total Protein, Urine: NEGATIVE
Urine Glucose: NEGATIVE
Urobilinogen, UA: 0.2 (ref 0.0–1.0)
pH: 6 (ref 5.0–8.0)

## 2024-06-20 LAB — TSH: TSH: 2.43 u[IU]/mL (ref 0.35–5.50)

## 2024-06-20 LAB — LIPID PANEL
Cholesterol: 202 mg/dL — ABNORMAL HIGH (ref 0–200)
HDL: 90.7 mg/dL (ref >39.00)
LDL Cholesterol: 98 mg/dL (ref 0–99)
NonHDL: 110.88
Total CHOL/HDL Ratio: 2
Triglycerides: 62 mg/dL (ref 0.0–149.0)
VLDL: 12.4 mg/dL (ref 0.0–40.0)

## 2024-06-20 LAB — VITAMIN D 25 HYDROXY (VIT D DEFICIENCY, FRACTURES): VITD: 23.8 ng/mL — ABNORMAL LOW (ref 30.00–100.00)

## 2024-06-20 LAB — VITAMIN B12: Vitamin B-12: 305 pg/mL (ref 211–911)

## 2024-06-20 NOTE — Progress Notes (Signed)
 Subjective:  Patient ID: [name], female    DOB: 1975-11-02  Age: 48 y.o. 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  C/o LBP  Outpatient Medications Prior to Visit  Medication Sig Dispense Refill   ADDERALL XR 25 MG 24 hr capsule Take 25 mg by mouth daily at 12 noon.     ADDERALL XR 30 MG 24 hr capsule Take 30 mg by mouth in the morning.     ALPRAZolam (XANAX) 0.5 MG tablet Take 0.5 mg by mouth at bedtime as needed for anxiety.     amLODipine -olmesartan  (AZOR ) 5-20 MG tablet TAKE ONE TABLET BY MOUTH ONCE DAILY Annual appt due in Oct must see provider for future refills 90 tablet 0   amLODipine -olmesartan  (AZOR ) 5-20 MG tablet Take 1 tablet by mouth daily. 90 tablet 1   benzonatate  (TESSALON ) 200 MG capsule Take 1 capsule (200 mg total) by mouth 2 (two) times daily as needed for cough. 20 capsule 0   buPROPion  (WELLBUTRIN  XL) 300 MG 24 hr tablet Take 300 mg by mouth daily.     Cholecalciferol (VITAMIN D3) 50 MCG (2000 UT) capsule Take 1 capsule (2,000 Units total) by mouth daily. 100 capsule 3   HYDROcodone  bit-homatropine (HYCODAN) 5-1.5 MG/5ML syrup Take 5 mLs by mouth every 6 (six) hours as needed for cough. 180 mL 0   Levonorgestrel-Ethinyl Estradiol (AMETHIA) 0.15-0.03 &0.01 MG tablet Take 1 tablet by mouth daily. Camrese     pantoprazole (PROTONIX) 40 MG tablet TAKE ONE tablet Orally twice a DAY 30 minutes BEFORE eating     B Complex-Folic Acid (B COMPLEX PLUS) TABS Take 1 tablet by mouth daily. (Patient not taking: Reported on 06/20/2024) 100 tablet 3   No facility-administered medications prior to visit.    ROS: Review of Systems  Constitutional:  Negative for activity change, appetite change, chills, fatigue and unexpected weight change.  HENT:  Negative for congestion, mouth sores and sinus  pressure.   Eyes:  Negative for visual disturbance.  Respiratory:  Negative for cough and chest tightness.   Gastrointestinal:  Negative for abdominal pain and nausea.  Genitourinary:  Negative for difficulty urinating, frequency and vaginal pain.  Musculoskeletal:  Positive for back pain. Negative for gait problem.  Skin:  Negative for pallor and rash.  Neurological:  Negative for dizziness, tremors, weakness, numbness and headaches.  Psychiatric/Behavioral:  Negative for confusion and sleep disturbance.     Objective:  BP 106/68   Pulse 96   Temp 97.8 F (36.6 C)   Ht 5' 6 (1.676 m)   Wt 139 lb 12.8 oz (63.4 kg)   SpO2 99%   BMI 22.56 kg/m   BP Readings from Last 3 Encounters:  06/20/24 106/68  10/19/23 96/68  02/21/23 (!) 97/55    Wt Readings from Last 3 Encounters:  06/20/24 139 lb 12.8 oz (63.4 kg)  10/19/23 148 lb (67.1 kg)  02/21/23 140 lb (63.5 kg)    Physical Exam Constitutional:      General: She is not in acute distress.    Appearance: Normal appearance. She is well-developed.  HENT:     Head: Normocephalic.     Right Ear: External ear normal.     Left Ear: External ear normal.     Nose: Nose normal.  Eyes:     General:  Right eye: No discharge.        Left eye: No discharge.     Conjunctiva/sclera: Conjunctivae normal.     Pupils: Pupils are equal, round, and reactive to light.  Neck:     Thyroid : No thyromegaly.     Vascular: No JVD.     Trachea: No tracheal deviation.  Cardiovascular:     Rate and Rhythm: Normal rate and regular rhythm.     Heart sounds: Normal heart sounds.  Pulmonary:     Effort: No respiratory distress.     Breath sounds: No stridor. No wheezing.  Abdominal:     General: Bowel sounds are normal. There is no distension.     Palpations: Abdomen is soft. There is no mass.     Tenderness: There is no abdominal tenderness. There is no guarding or rebound.  Musculoskeletal:        General: No tenderness.     Cervical  back: Normal range of motion and neck supple. No rigidity.  Lymphadenopathy:     Cervical: No cervical adenopathy.  Skin:    Findings: No erythema or rash.  Neurological:     Mental Status: She is oriented to person, place, and time.     Cranial Nerves: No cranial nerve deficit.     Motor: No abnormal muscle tone.     Coordination: Coordination normal.     Gait: Gait normal.     Deep Tendon Reflexes: Reflexes normal.  Psychiatric:        Behavior: Behavior normal.        Thought Content: Thought content normal.        Judgment: Judgment normal.     Lab Results  Component Value Date   WBC 6.3 06/20/2024   HGB 12.5 06/20/2024   HCT 37.7 06/20/2024   PLT 310.0 06/20/2024   GLUCOSE 82 06/20/2024   CHOL 202 (H) 06/20/2024   TRIG 62.0 06/20/2024   HDL 90.70 06/20/2024   LDLCALC 98 06/20/2024   ALT 11 06/20/2024   AST 15 06/20/2024   NA 138 06/20/2024   K 4.0 06/20/2024   CL 104 06/20/2024   CREATININE 0.97 06/20/2024   BUN 24 (H) 06/20/2024   CO2 26 06/20/2024   TSH 2.43 06/20/2024    CT ABDOMEN PELVIS W CONTRAST Result Date: 10/09/2023 CLINICAL DATA:  Ovarian tumor a borderline malignancy, follow-up. * Tracking Code: BO * EXAM: CT ABDOMEN AND PELVIS WITH CONTRAST TECHNIQUE: Multidetector CT imaging of the abdomen and pelvis was performed using the standard protocol following bolus administration of intravenous contrast. RADIATION DOSE REDUCTION: This exam was performed according to the departmental dose-optimization program which includes automated exposure control, adjustment of the mA and/or kV according to patient size and/or use of iterative reconstruction technique. CONTRAST:  85mL OMNIPAQUE  IOHEXOL  300 MG/ML  SOLN COMPARISON:  Multiple priors including CT January 10, 2023 FINDINGS: Lower chest: No acute abnormality. Hepatobiliary: No suspicious hepatic lesion. Gallbladder is unremarkable. No biliary ductal dilation. Pancreas: Unremarkable. No pancreatic ductal dilatation or  surrounding inflammatory changes. Spleen: Normal in size without focal abnormality. Adrenals/Urinary Tract: Adrenal glands are unremarkable. Kidneys are normal, without renal calculi, focal lesion, or hydronephrosis. Bladder is unremarkable. Stomach/Bowel: Stomach is unremarkable for degree of distension. No evidence of acute bowel inflammation. No evidence of bowel obstruction. Vascular/Lymphatic: No significant vascular findings are present. No enlarged abdominal or pelvic lymph nodes. Reproductive: Uterus is unremarkable.  No suspicious adnexal mass. Other: No significant abdominopelvic free fluid. Musculoskeletal: No aggressive lytic or  blastic lesion of bone. L5-S1 discogenic disease. IMPRESSION: 1. No evidence of metastatic disease in the abdomen or pelvis. 2. L5-S1 discogenic disease. Electronically Signed   By: Reyes Holder M.D.   On: 10/09/2023 08:49    Assessment & Plan:   Problem List Items Addressed This Visit     Depression   Off Lexapro  Wellbutrin  XR      Low back pain - Primary   Relapsed      Vitamin B 12 deficiency   On B12      Relevant Orders   Vitamin B12 (Completed)   Vitamin D  deficiency   On Vit D      Relevant Orders   VITAMIN D  25 Hydroxy (Vit-D Deficiency, Fractures) (Completed)   Well adult exam    We discussed age appropriate health related issues, including available/recomended screening tests and vaccinations. Labs were ordered to be later reviewed . All questions were answered. We discussed one or more of the following - seat belt use, use of sunscreen/sun exposure exercise, fall risk reduction, second hand smoke exposure, firearm use and storage, seat belt use, a need for adhering to healthy diet and exercise. Labs were ordered.  All questions were answered. Colonoscopy - Dr Rosalie due in 2023, 2024 -Dr. 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       Relevant Orders   TSH (Completed)   Urinalysis (Completed)   CBC with Differential/Platelet (Completed)   Lipid panel (Completed)   Comprehensive metabolic panel with GFR (Completed)   Vitamin B12 (Completed)   VITAMIN D  25 Hydroxy (Vit-D Deficiency, Fractures) (Completed)      No orders of the defined types were placed in this encounter.     Follow-up: Return in about 1 year (around 06/20/2025) for Wellness Exam.  Marolyn Noel, MD

## 2024-06-20 NOTE — Assessment & Plan Note (Signed)
 Relapsed.

## 2024-06-20 NOTE — Assessment & Plan Note (Signed)
 On Vit D

## 2024-06-20 NOTE — Assessment & Plan Note (Signed)
 Off Lexapro  Wellbutrin  XR

## 2024-06-20 NOTE — Assessment & Plan Note (Signed)
 On B12

## 2024-06-21 ENCOUNTER — Ambulatory Visit: Payer: Self-pay | Admitting: Internal Medicine

## 2024-06-23 NOTE — Assessment & Plan Note (Signed)
  We discussed age appropriate health related issues, including available/recomended screening tests and vaccinations. Labs were ordered to be later reviewed . All questions were answered. We discussed one or more of the following - seat belt use, use of sunscreen/sun exposure exercise, fall risk reduction, second hand smoke exposure, firearm use and storage, seat belt use, a need for adhering to healthy diet and exercise. Labs were ordered.  All questions were answered. Colonoscopy - Dr Rosalie due in 2023, 2024 -Dr. 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
